# Patient Record
Sex: Female | Born: 1990
Health system: Southern US, Community
[De-identification: ages and names within clinical notes are randomized; demographics above are authoritative.]

## PROBLEM LIST (undated history)

## (undated) ENCOUNTER — Inpatient Hospital Stay (HOSPITAL_COMMUNITY): Payer: Self-pay

## (undated) DIAGNOSIS — R519 Headache, unspecified: Secondary | ICD-10-CM

## (undated) DIAGNOSIS — N289 Disorder of kidney and ureter, unspecified: Secondary | ICD-10-CM

## (undated) DIAGNOSIS — Z789 Other specified health status: Secondary | ICD-10-CM

## (undated) DIAGNOSIS — J45909 Unspecified asthma, uncomplicated: Secondary | ICD-10-CM

## (undated) DIAGNOSIS — K56609 Unspecified intestinal obstruction, unspecified as to partial versus complete obstruction: Secondary | ICD-10-CM

## (undated) HISTORY — PX: ANTERIOR CRUCIATE LIGAMENT REPAIR: SHX115

---
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
## 2010-07-06 ENCOUNTER — Emergency Department (HOSPITAL_COMMUNITY)
Admission: EM | Admit: 2010-07-06 | Discharge: 2010-07-06 | Disposition: A | Discharge status: Home / Self Care | Payer: Self-pay | Attending: Emergency Medicine | Admitting: Emergency Medicine

## 2010-07-06 DIAGNOSIS — R109 Unspecified abdominal pain: Secondary | ICD-10-CM | POA: Insufficient documentation

## 2010-07-06 DIAGNOSIS — N390 Urinary tract infection, site not specified: Secondary | ICD-10-CM | POA: Insufficient documentation

## 2010-07-06 DIAGNOSIS — R10819 Abdominal tenderness, unspecified site: Secondary | ICD-10-CM | POA: Insufficient documentation

## 2010-07-06 LAB — POCT I-STAT, CHEM 8
BUN: 9 mg/dL (ref 6–23)
Creatinine, Ser: 0.9 mg/dL (ref 0.4–1.2)
Potassium: 3.6 mEq/L (ref 3.5–5.1)
Sodium: 140 mEq/L (ref 135–145)
TCO2: 23 mmol/L (ref 0–100)

## 2010-07-06 LAB — DIFFERENTIAL
Basophils Absolute: 0 10*3/uL (ref 0.0–0.1)
Basophils Relative: 0 % (ref 0–1)
Eosinophils Absolute: 0.3 10*3/uL (ref 0.0–0.7)
Monocytes Relative: 7 % (ref 3–12)
Neutro Abs: 6.5 10*3/uL (ref 1.7–7.7)
Neutrophils Relative %: 70 % (ref 43–77)

## 2010-07-06 LAB — CBC
Hemoglobin: 12.3 g/dL (ref 12.0–15.0)
MCH: 29 pg (ref 26.0–34.0)
Platelets: 300 10*3/uL (ref 150–400)
RBC: 4.24 MIL/uL (ref 3.87–5.11)
WBC: 9.3 10*3/uL (ref 4.0–10.5)

## 2010-07-07 LAB — URINALYSIS, ROUTINE W REFLEX MICROSCOPIC
Protein, ur: NEGATIVE mg/dL
Specific Gravity, Urine: 1.02 (ref 1.005–1.030)
Urine Glucose, Fasting: NEGATIVE mg/dL
Urobilinogen, UA: 0.2 mg/dL (ref 0.0–1.0)

## 2010-07-07 LAB — URINE MICROSCOPIC-ADD ON
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
## 2010-10-25 ENCOUNTER — Emergency Department (HOSPITAL_COMMUNITY)
Admission: EM | Admit: 2010-10-25 | Discharge: 2010-10-25 | Disposition: A | Discharge status: Home / Self Care | Payer: Self-pay | Attending: Emergency Medicine | Admitting: Emergency Medicine

## 2010-10-25 ENCOUNTER — Emergency Department (HOSPITAL_COMMUNITY): Payer: Self-pay

## 2010-10-25 ENCOUNTER — Inpatient Hospital Stay (HOSPITAL_COMMUNITY)
Admission: EM | Admit: 2010-10-25 | Discharge: 2010-11-01 | DRG: 390 | Disposition: A | Discharge status: Home / Self Care | Payer: Medicaid Other | Attending: Internal Medicine | Admitting: Internal Medicine

## 2010-10-25 DIAGNOSIS — R630 Anorexia: Secondary | ICD-10-CM | POA: Insufficient documentation

## 2010-10-25 DIAGNOSIS — E876 Hypokalemia: Secondary | ICD-10-CM | POA: Diagnosis present

## 2010-10-25 DIAGNOSIS — K5900 Constipation, unspecified: Secondary | ICD-10-CM | POA: Insufficient documentation

## 2010-10-25 DIAGNOSIS — K5669 Other intestinal obstruction: Principal | ICD-10-CM | POA: Diagnosis present

## 2010-10-25 DIAGNOSIS — R109 Unspecified abdominal pain: Secondary | ICD-10-CM | POA: Insufficient documentation

## 2010-10-25 LAB — URINE MICROSCOPIC-ADD ON

## 2010-10-25 LAB — URINALYSIS, ROUTINE W REFLEX MICROSCOPIC
Bilirubin Urine: NEGATIVE
Glucose, UA: NEGATIVE mg/dL
Specific Gravity, Urine: 1.026 (ref 1.005–1.030)

## 2010-10-26 ENCOUNTER — Emergency Department (HOSPITAL_COMMUNITY): Payer: Medicaid Other

## 2010-10-26 ENCOUNTER — Inpatient Hospital Stay (HOSPITAL_COMMUNITY): Payer: Medicaid Other

## 2010-10-26 LAB — COMPREHENSIVE METABOLIC PANEL
ALT: 8 U/L (ref 0–35)
Albumin: 3.7 g/dL (ref 3.5–5.2)
Albumin: 4.1 g/dL (ref 3.5–5.2)
Alkaline Phosphatase: 44 U/L (ref 39–117)
BUN: 12 mg/dL (ref 6–23)
Chloride: 101 mEq/L (ref 96–112)
Creatinine, Ser: 0.62 mg/dL (ref 0.50–1.10)
Potassium: 4 mEq/L (ref 3.5–5.1)
Sodium: 139 mEq/L (ref 135–145)
Total Bilirubin: 0.6 mg/dL (ref 0.3–1.2)
Total Protein: 7.1 g/dL (ref 6.0–8.3)

## 2010-10-26 LAB — DIFFERENTIAL
Eosinophils Relative: 1 % (ref 0–5)
Lymphocytes Relative: 10 % — ABNORMAL LOW (ref 12–46)
Lymphocytes Relative: 12 % (ref 12–46)
Lymphs Abs: 1 10*3/uL (ref 0.7–4.0)
Lymphs Abs: 1 10*3/uL (ref 0.7–4.0)
Monocytes Absolute: 0.5 10*3/uL (ref 0.1–1.0)
Monocytes Relative: 5 % (ref 3–12)
Neutro Abs: 7.2 10*3/uL (ref 1.7–7.7)
Neutrophils Relative %: 83 % — ABNORMAL HIGH (ref 43–77)

## 2010-10-26 LAB — MAGNESIUM: Magnesium: 2.2 mg/dL (ref 1.5–2.5)

## 2010-10-26 LAB — CBC
HCT: 36.9 % (ref 36.0–46.0)
MCH: 28.3 pg (ref 26.0–34.0)
MCHC: 34.5 g/dL (ref 30.0–36.0)
MCHC: 34.7 g/dL (ref 30.0–36.0)
MCV: 81.6 fL (ref 78.0–100.0)
RDW: 13.6 % (ref 11.5–15.5)
RDW: 13.8 % (ref 11.5–15.5)
WBC: 9.7 10*3/uL (ref 4.0–10.5)

## 2010-10-26 LAB — TSH
TSH: 0.647 u[IU]/mL (ref 0.350–4.500)
TSH: 0.924 u[IU]/mL (ref 0.350–4.500)

## 2010-10-26 LAB — URINALYSIS, ROUTINE W REFLEX MICROSCOPIC
Bilirubin Urine: NEGATIVE
Ketones, ur: >80 mg/dL — AB
Nitrite: NEGATIVE
Specific Gravity, Urine: 1.029 (ref 1.005–1.030)
Urobilinogen, UA: 0.2 mg/dL (ref 0.0–1.0)

## 2010-10-26 LAB — PREGNANCY, URINE: Preg Test, Ur: NEGATIVE

## 2010-10-26 LAB — LIPASE, BLOOD: Lipase: 9 U/L — ABNORMAL LOW (ref 11–59)

## 2010-10-26 LAB — URINE MICROSCOPIC-ADD ON

## 2010-10-26 LAB — GLUCOSE, CAPILLARY
Glucose-Capillary: 92 mg/dL (ref 70–99)
Glucose-Capillary: 102 mg/dL — ABNORMAL HIGH (ref 70–99)

## 2010-10-26 MED ORDER — IOHEXOL 300 MG/ML  SOLN
100.0000 mL | Freq: Once | INTRAMUSCULAR | Status: AC | PRN
  Administered 2010-10-26: 100 mL via INTRAVENOUS

## 2010-10-27 ENCOUNTER — Other Ambulatory Visit: Payer: Self-pay | Admitting: Gastroenterology

## 2010-10-27 ENCOUNTER — Inpatient Hospital Stay (HOSPITAL_COMMUNITY): Payer: Medicaid Other

## 2010-10-27 LAB — CBC
MCH: 28.6 pg (ref 26.0–34.0)
MCHC: 34.7 g/dL (ref 30.0–36.0)
Platelets: 295 10*3/uL (ref 150–400)
RBC: 4.19 MIL/uL (ref 3.87–5.11)
RDW: 13.8 % (ref 11.5–15.5)

## 2010-10-27 LAB — GLUCOSE, CAPILLARY
Glucose-Capillary: 120 mg/dL — ABNORMAL HIGH (ref 70–99)
Glucose-Capillary: 128 mg/dL — ABNORMAL HIGH (ref 70–99)
Glucose-Capillary: 158 mg/dL — ABNORMAL HIGH (ref 70–99)

## 2010-10-27 LAB — BASIC METABOLIC PANEL
CO2: 26 mEq/L (ref 19–32)
Calcium: 8.7 mg/dL (ref 8.4–10.5)
GFR calc Af Amer: >60 mL/min (ref >60)
GFR calc non Af Amer: >60 mL/min (ref >60)
Sodium: 139 mEq/L (ref 135–145)

## 2010-10-28 ENCOUNTER — Inpatient Hospital Stay (HOSPITAL_COMMUNITY): Payer: Medicaid Other

## 2010-10-28 LAB — COMPREHENSIVE METABOLIC PANEL
Albumin: 3.1 g/dL — ABNORMAL LOW (ref 3.5–5.2)
Alkaline Phosphatase: 37 U/L — ABNORMAL LOW (ref 39–117)
BUN: 5 mg/dL — ABNORMAL LOW (ref 6–23)
CO2: 29 mEq/L (ref 19–32)
Chloride: 104 mEq/L (ref 96–112)
Creatinine, Ser: 0.55 mg/dL (ref 0.50–1.10)
GFR calc Af Amer: >60 mL/min (ref >60)
GFR calc non Af Amer: >60 mL/min (ref >60)
Glucose, Bld: 103 mg/dL — ABNORMAL HIGH (ref 70–99)
Potassium: 3.5 mEq/L (ref 3.5–5.1)
Total Bilirubin: 0.5 mg/dL (ref 0.3–1.2)

## 2010-10-28 LAB — GLUCOSE, CAPILLARY: Glucose-Capillary: 110 mg/dL — ABNORMAL HIGH (ref 70–99)

## 2010-10-28 LAB — CBC
HCT: 32.5 % — ABNORMAL LOW (ref 36.0–46.0)
Hemoglobin: 11.3 g/dL — ABNORMAL LOW (ref 12.0–15.0)
MCV: 83.5 fL (ref 78.0–100.0)
RBC: 3.89 MIL/uL (ref 3.87–5.11)
WBC: 6.7 10*3/uL (ref 4.0–10.5)

## 2010-10-29 ENCOUNTER — Inpatient Hospital Stay (HOSPITAL_COMMUNITY): Payer: Medicaid Other

## 2010-10-29 LAB — GLUCOSE, CAPILLARY
Glucose-Capillary: 111 mg/dL — ABNORMAL HIGH (ref 70–99)
Glucose-Capillary: 72 mg/dL (ref 70–99)
Glucose-Capillary: 90 mg/dL (ref 70–99)
Glucose-Capillary: 91 mg/dL (ref 70–99)

## 2010-10-29 LAB — CBC
HCT: 34.9 % — ABNORMAL LOW (ref 36.0–46.0)
MCH: 28.8 pg (ref 26.0–34.0)
MCV: 82.5 fL (ref 78.0–100.0)
RBC: 4.23 MIL/uL (ref 3.87–5.11)
RDW: 13.9 % (ref 11.5–15.5)
WBC: 6.7 10*3/uL (ref 4.0–10.5)

## 2010-10-30 LAB — GLUCOSE, CAPILLARY
Glucose-Capillary: 123 mg/dL — ABNORMAL HIGH (ref 70–99)
Glucose-Capillary: 96 mg/dL (ref 70–99)

## 2010-10-31 ENCOUNTER — Inpatient Hospital Stay (HOSPITAL_COMMUNITY): Payer: Medicaid Other

## 2010-11-01 ENCOUNTER — Inpatient Hospital Stay (HOSPITAL_COMMUNITY): Payer: Medicaid Other

## 2010-11-02 NOTE — H&P (Signed)
April Crane, April Crane NO.:  [id number]  MEDICAL RECORD NO.:  [id number]  LOCATION:  [facility]                         FACILITY:  [facility]  PHYSICIAN:  Eduard Clos, MDDATE OF BIRTH:  12-Feb-1991  DATE OF ADMISSION:  10/25/2010 DATE OF DISCHARGE:                             HISTORY & PHYSICAL   PRIMARY CARE PHYSICIAN:  Lorelle Formosa, MD  CHIEF COMPLAINT:  Abdominal pain.  HISTORY OF PRESENTING ILLNESS:  A 20 year old female with no significant past medical history, has been experiencing abdominal pain over the last 3-4 days.  The abdominal pain is usually in the lower part of the abdomen and both lower quadrants, has gotten gradually worse, and she had multiple episodes of nausea, vomiting.  She did come to ER initially and had x-rays done and was sent home back, but since the symptoms persisted, the patient came back.  At that time, had a CT of abdomen and pelvis with contrast which at this time shows colonic obstruction with focal wall thickening in the sigmoid colon.  At this time, the patient has been admitted for further workup.  Initially, Surgery was called and they have recommended Hospitalist admission.  The patient denies any chest pain, shortness of breath.  Denies any cough or phlegm, fever or chills.  Denies any dysuria, discharge, or diarrhea.  The last time she had moved bowels was more than 4 days ago. She also has not moved any flatus.  The pain is more of a constant, dull, aching pain, sometimes crampy.  The patient has denied using any recent antibiotics.  Denies any loss of consciousness or focal deficit.  PAST MEDICAL HISTORY:  Nothing significant.  PAST SURGICAL HISTORY:  Has had surgery for knee.  MEDICATIONS PRIOR TO ADMISSION:  None.  SOCIAL HISTORY:  The patient denies smoking cigarettes, drinking alcohol, or using illegal drugs.  FAMILY HISTORY:  Significant for breast cancer in a grandmother, also stroke in the family  in a grandfather.  REVIEW OF SYSTEMS:  As per the history of presenting illness, nothing else significant.  ALLERGIES:  No known drug allergies.  PHYSICAL EXAMINATION:  GENERAL:  The patient examined at bedside, not in any acute distress. VITAL SIGNS:  Blood pressure 120/80, pulse 96 per minute, temperature 99.3, respirations 18 per minute, O2 sat 100%. HEENT:  Anicteric.  No pallor.  No discharge from ears, eyes, nose, or mouth. CHEST:  Bilateral air entry present.  No rhonchi, no crepitation. HEART:  S1, S2 heard. ABDOMEN:  Soft.  There is tenderness.  At this time, tenderness is more diffuse, but more pronounced in the lower part of the abdomen. CNS:  The patient is alert, awake; oriented to time, place, and person. Moves upper and lower extremities 5/5. EXTREMITIES:  Peripheral pulses felt.  No edema.  LABORATORY DATA:  Acute abdomen series shows mild cardiomegaly, no evidence of obstruction or free air.  CT abdomen and pelvis with contrast shows distal colonic obstruction with focal wall thickening seen in the sigmoid colon, likely due to inflammatory or infectious process, although unlikely given the patient's age, neoplasm cannot be excluded.  Findings discussed with Dr. Achilles Dunk at the time of interpretation.  CBC; WBC  is 9.7, hemoglobin is 12.8, hematocrit is 36.9, platelets 302, neutrophils 85%.  Complete metabolic panel; sodium 138, potassium 3.8, chloride 101, carbon dioxide 26, glucose 107, BUN 12, creatinine 0.6.  Total bilirubin is 0.6, alkaline phos is 46, AST 13, ALT 9, total protein 8, albumin 4.1, calcium 9.5, lipase 9. Pregnancy screen is negative.  UA appears cloudy, more than 80 ketones, nitrites negative, leukocyte is trace, squamous cells many, wbc 3-6, bacteria many, mucus present.  ASSESSMENT:  Distal colonic obstruction with focal wall thickening seen in the sigmoid colon, likely due to inflammation or infectious process.  PLAN: 1. At this time, we will  admit the patient to medical floor. 2. For her chronic obstruction and focal wall thickening of the     sigmoid colon, at this time, we will keep the patient n.p.o., place     the patient on Cipro and Flagyl, IV fluids, and recheck a CMET,     CBC.  I have also consulted Dr. Charlesetta Shanks of Surgery for their     opinion and further recommendation as condition evolves and based     on the consult recommendations.     Eduard Clos, MD     ANK/MEDQ  D:  10/26/2010  T:  10/26/2010  Job:  [id number]  cc:   Lorelle Formosa, M.D.  Electronically Signed by Midge Minium MD on 11/02/2010 07:39:41 AM

## 2010-11-17 NOTE — Consult Note (Signed)
NAMEABIGAELLE, VERLEY NO.:  [id number]  MEDICAL RECORD NO.:  [id number]  LOCATION:  5127                         FACILITY:  [facility]  PHYSICIAN:  Velora Heckler, MD      DATE OF BIRTH:  05-31-1990  DATE OF CONSULTATION:  10/26/2010 DATE OF DISCHARGE:                                CONSULTATION   REQUESTING PHYSICIAN:  Eduard Clos, MD  PRIMARY CARE DOCTOR:  Lorelle Formosa, MD  REASON FOR CONSULTATION:  Abdominal pain and CT showing colon obstruction.  BRIEF HISTORY:  The patient is a 20 year old African American female who has been in good health.  She reports on Thursday she started having abdominal pain.  She has had no nausea or vomiting.  No diarrhea.  She initially told me that she had not had a bowel movement for 2 months. Her sister is with her she says it has been a month or more.  The patient can't  confirm how long, but she will agrees possibly a month.  She was seen in the ER yesterday and diagnosis of constipation was made. She was treated with MiraLax, Colace, and mag citrate.  She went home and apparently get to the point where she could not walk and her family brought her back to the ER.  Flat plate of the abdomen shows mild cardiomegaly, no evidence of obstruction or free air.  There was a nonobstructive pattern at that time.  On return, CT of the abdomen shows distal colonic obstruction with focal wall thickening seen in the sigmoid colon, likely due to inflammation or infectious process.  The last film done at 10:58 a.m. shows colon dilatation and obstruction, NG is in place to coil, but no changes from the prior studies.  We were asked to see in consultation.  PAST MEDICAL HISTORY:  None.  It is of note that she had trouble with nausea and vomiting in February for a month which coincided after a depo shot for birth control pills.  The patient's sister does not know which medicine was given.  Otherwise, no past medical  history.  PAST SURGICAL HISTORY:  She had ACL tear in the left knee.  FAMILY HISTORY:  Both parents are living and healthy.  Five brothers and two sisters, one brother died with some problems with hydrocephalus, one brother 35 has migraines, one sister has asthma, brothers are healthy.  SOCIAL HISTORY:  The patient denies drug use, tobacco use, alcohol use. She lives with her mom.  She is unemployed.  REVIEW OF SYSTEMS:  No fever.  She has had some chills since this morning.  CVS:  Negative for headache, dizziness, syncope, presyncope, stroke or seizures.  SKIN:  No changes.  PSYCH:  No changes.  HEART:  No chest pain or angina.  GI:  Negative for GERD.  No diarrhea.  Positive for constipation for least a month, perhaps longer.  BLOOD:  None.  GU: No trouble voiding.  LOWER EXTREMITIES:  No edema claudication.  GYN: She has had discharge and some slow bleeding sounds like implementation or implantation of an Implanon for birth control.  CURRENT MEDICATIONS:  Implanon, birth control, placed in March.  It  was done at [facility] in [place].  Yesterday she received Colace, milk of magnesia or mag citrate were uncertain which and Maalox and had nausea and vomiting with that.  ALLERGIES:  Possibly DEPO-PROVERA, she had nausea and vomiting for a month after the shot was given in February.  PHYSICAL EXAMINATION:  GENERAL:  This is a well-nourished [place] American female.  She is distended.  She is quite uncomfortable and very quiet.  Her sisters answers most of the questions.  VITAL SIGNS: Temperature is 98.6, heart rate is 93, blood pressure is 120/78, respiratory rate is 18, and sats 100% on room air. HEAD:  Eyes, ears, nose, and throat are grossly normal. NECK:  Trachea is in the midline.  Thyroid is nonpalpable. CHEST:  Clear to auscultation.  Her chest is nontender. CARDIAC:  Normal S1 and S2.  She is tachycardic.  Pulses are +2 and equal upper and lower  extremities. ABDOMEN:  Markedly distended.  Bowel sounds are absent.  She is slightly tender. GU/RECTAL:  Deferred. LYMPHADENOPATHY:  None palpated. MUSCULOSKELETAL:  No joint abnormalities. SKIN:  No changes. NEUROLOGIC:  Cranial nerves intact.  The patient is alert and cooperative. PSYCH:  Flat affect.  Sister says she has always very quiet.  LABORATORY DATA:  White count is 8.7, hemoglobin is 12, hematocrit is 35, and platelets are 389,000.  HCG was negative.  Lipase was 9.  UA showed 3-6 white cells and red cells per high-powered field with many bacteria.  She is positive for ketones.  Sodium is 139, potassium is 4, chloride 103, CO2 is 24, BUN is 10, creatinine is 0.49, glucose is 103, total bilirubin 0.6, alk phos 44, SGOT 12, and SGPT 18.  IMPRESSION: 1. Colon obstruction, questionable colitis. 2. Constipation a month, perhaps longer. 3. Abnormal menses since implantation of Implanon. 4. History of nausea and vomiting with Depo-Provera shot for birth     control.  PLAN:  I talked to the hospitalist and GI is being called.  We will continue follow with you, agree with NG, further workup and evaluation as needed.     Eber Hong, P.A.   ______________________________ Velora Heckler, MD    WDJ/MEDQ  D:  10/26/2010  T:  10/26/2010  Job:  [id number]  cc:   Lorelle Formosa, M.D.  Electronically Signed by Sherrie George P.A. on 11/02/2010 03:52:31 PM Electronically Signed by Darnell Level MD on 11/17/2010 10:58:04 AM

## 2010-11-24 NOTE — Discharge Summary (Signed)
NAMEKI, April NO.:  [id number]  MEDICAL RECORD NO.:  [id number]  LOCATION:                                 FACILITY:  PHYSICIAN:  April Cove, MD     DATE OF BIRTH:  1990/09/01  DATE OF ADMISSION: DATE OF DISCHARGE:                              DISCHARGE SUMMARY   PRIMARY CARE PHYSICIAN:  April Formosa, MD  DISCHARGE DIAGNOSES: 1. Colonic obstruction/sigmoid edema, improved. 2. Pathology suspicious for ischemia, possible small thrombotic event     due to birth control implant.  CONSULTANTS: 1. Velora Heckler, MD, [facility]. 2. John C. Madilyn Fireman, MD, [facility].  DIAGNOSTIC INVESTIGATIONS: 1. CT of abdomen and pelvis, November 03, 2010, distal colonic obstruction     focal wall thickening in the sigmoid due to inflammatory or     infectious process.  Repeat KUB showed marked dilatation of the     colon compatible with distal colonic obstruction.  Followup x-ray     last one June 01, 2010, shows decrease in colonic distention,     however, colon is mildly distended proximal to the mid descending     colon, beyond which there is a little gas, low-grade obstruction of     the mid descending colon is possible. 2. Colonoscopy by Dr. Dorena Cookey showed sigmoid thickening with     proximal dilatation, biopsies done. 3. Pathology:  Colonic mucosa was crypt distortion, crypt elongation,     and crypt atrophy, associated with lamina propria edema,     hyalinization, minimal neutrophilic inflammation, negative for     dysplasia.  Findings are not that of idiopathic inflammatory bowel     disease or microscopic colitis.  Morphological features are     consistent with ischemia type of injury.  HOSPITAL COURSE:  April Crane is a pleasant 20 year old African American female with no significant past medical history presented to the hospital with lower abdominal pain, distention, nausea, and vomiting. On evaluation in the ED, she was found to  have colonic obstruction and sigmoid edema, subsequently followed by [facility] as well as Dr. Madilyn Fireman with [facility], underwent a colonoscopy which showed sigmoid edema.  Pathology was really not very diagnostic but suspicious for ischemic injury.  This is a possibility given the fact that the patient has been on Implanon, has had an Implanon implant which is a progesterone based dermal implant for birth control.  She has been advised to get this removed since it could have been a potential small thrombotic event that caused this and she is also advised to use alternative birth control methods and avoid birth control pills. The patient's clinical course has improved through her hospital stay in the last week, especially in the last couple of days she has been originally started on clear liquids, advanced to full liquids, and then bland diet which she has been tolerating well.  She has had a couple of bowel movements yesterday as well as today and is being discharged home in a stable condition to follow up with Dr. Madilyn Fireman of [facility].  She was also being empirically treated with Cipro and Flagyl and will continue  this to complete a 10-day course of antibiotics.  DISCHARGE FOLLOWUP: 1. April Formosa, MD, in 1 week. 2. John C. Madilyn Fireman, MD, in 2-3 weeks.     April Cove, MD     PJ/MEDQ  D:  11/01/2010  T:  11/01/2010  Job:  [id number]  cc:   Everardo All. Madilyn Fireman, M.D. April Crane, M.D.  Electronically Signed by April Crane  on 11/24/2010 08:13:42 PM

## 2010-11-25 NOTE — Consult Note (Signed)
  April Crane, GOMES NO.:  [id number]  MEDICAL RECORD NO.:  [id number]  LOCATION:  5127                         FACILITY:  [facility]  PHYSICIAN:  Jaydence Arnesen C. Madilyn Fireman, M.D.    DATE OF BIRTH:  1990-08-18  DATE OF CONSULTATION:  10/26/2010 DATE OF DISCHARGE:                                CONSULTATION   REASON FOR CONSULTATION:  Colonic obstruction with marked sigmoid thickening on CT scan.  HISTORY OF PRESENT ILLNESS:  The patient is a 20 year old white female who was in her usual state of good health when she began experiencing lower abdominal pain progressive over 2 to 3 days, culminating in an abdominal distention, multiple episodes of nausea and vomiting.  She was seen in the emergency room, once treated conservatively, and sent back, but presented again and on the second time CT scan of the abdomen showed significant colonic distention down to a marked circumferential thickening of the sigmoid colon with decompression of the colon distally.  The patient states she has not had any bowel movements or passed any gas today.  She has no family history of inflammatory bowel disease.  She denies any recent antibiotics or significant travel.  She has not had any trouble with constipation or impactions in the past.  PAST MEDICAL HISTORY:  Essentially unremarkable.  PAST SURGICAL HISTORY:  ACL and knee repair.  FAMILY HISTORY:  Both parents healthy.  No family history of GI malignancy.  MEDICATIONS:  None.  ALLERGIES:  None known.  PHYSICAL EXAMINATION:  GENERAL:  The patient currently with an NG tube in place, sedated with pain medicines, minimally verbal, but arousable and nods appropriately to questions with brief verbal answers. HEENT:  Unremarkable. HEART:  Regular rate and rhythm without murmur. ABDOMEN:  Moderately firm, distended, symmetrically with hypoactive bowel sounds.  There is diffuse mild tenderness with mild rebound.  LABORATORY DATA:  Hemoglobin  12.2, hematocrit 35.3, WBC 8700.  Pregnancy test negative.  Urinalysis shows large blood, but only 3-6 wbc's per high-powered field.  Lipase normal.  IMPRESSION:  Acute colonic obstruction with circumferential sigmoid thickening, felt to be inflammatory or infectious greater than neoplastic, somewhat unusual presentation in this age for inflammatory bowel disease or either an acute infectious and colitis.  PLAN: 1. We will obtain stool studies if available. 2. Agree with Cipro and Flagyl. 3. Agree with surgery consult. 4. NG suction for now. 5. Reevaluate in morning.  We will discuss with Surgery, threshold for     possible sigmoidoscopy versus Gastrografin enema versus surgical     intervention.          ______________________________ Everardo All Madilyn Fireman, M.D.     JCH/MEDQ  D:  10/26/2010  T:  10/26/2010  Job:  [id number]  cc:   Velora Heckler, MD  Electronically Signed by Dorena Cookey M.D. on 11/25/2010 07:09:39 PM

## 2011-02-02 LAB — POCT HEMOGLOBIN-HEMACUE: Hemoglobin: 12

## 2011-05-22 ENCOUNTER — Emergency Department (HOSPITAL_COMMUNITY)
Admission: EM | Admit: 2011-05-22 | Discharge: 2011-05-22 | Disposition: A | Discharge status: Home / Self Care | Payer: Medicaid Other | Attending: Emergency Medicine | Admitting: Emergency Medicine

## 2011-05-22 ENCOUNTER — Encounter (HOSPITAL_COMMUNITY): Payer: Self-pay | Admitting: Emergency Medicine

## 2011-05-22 DIAGNOSIS — L02416 Cutaneous abscess of left lower limb: Secondary | ICD-10-CM

## 2011-05-22 DIAGNOSIS — L03119 Cellulitis of unspecified part of limb: Secondary | ICD-10-CM | POA: Insufficient documentation

## 2011-05-22 DIAGNOSIS — L02419 Cutaneous abscess of limb, unspecified: Secondary | ICD-10-CM | POA: Insufficient documentation

## 2011-05-22 MED ORDER — LIDOCAINE-EPINEPHRINE 2 %-1:100000 IJ SOLN
20.0000 mL | Freq: Once | INTRAMUSCULAR | Status: AC
  Administered 2011-05-22: 20 mL via INTRADERMAL

## 2011-05-22 MED ORDER — HYDROCODONE-ACETAMINOPHEN 5-325 MG PO TABS: 2.0000 | ORAL_TABLET | ORAL | Status: DC | PRN

## 2011-05-22 NOTE — ED Provider Notes (Signed)
History     CSN: [id number]  Arrival date & time 05/22/11  1023   First MD Initiated Contact with Patient 05/22/11 1038      Chief Complaint  Patient presents with  . Abscess    (Consider location/radiation/quality/duration/timing/severity/associated sxs/prior treatment) HPI  21 year old female presenting to the ED with chief complaints of abscess. Patient noticed a area of redness and drainage to the lateral side of the left leg. Area is tender to the touch, with increased swelling for the past week. Patient states it feels similar to the prior abscess. She denies fever, nausea, vomiting, diarrhea, joint pain. She denies recent trauma, or insect bite. She has tried using warm compress over the wound without moderate relief.  History reviewed. No pertinent past medical history.  History reviewed. No pertinent past surgical history.  History reviewed. No pertinent family history.  History  Substance Use Topics  . Smoking status: Never Smoker   . Smokeless tobacco: Not on file  . Alcohol Use: No    OB History    Grav Para Term Preterm Abortions TAB SAB Ect Mult Living                  Review of Systems  All other systems reviewed and are negative.    Allergies  Other  Home Medications   Current Outpatient Rx  Name Route Sig Dispense Refill  . IBUPROFEN 800 MG PO TABS Oral Take 800 mg by mouth 2 (two) times daily as needed. For pain.      BP 115/72  Pulse 98  Temp(Src) 98.4 F (36.9 C) (Oral)  Resp 18  SpO2 100%  Physical Exam  Nursing note and vitals reviewed. Constitutional:       Awake, alert, nontoxic appearance  HENT:  Head: Atraumatic.  Eyes: Right eye exhibits no discharge. Left eye exhibits no discharge.  Neck: Neck supple.  Pulmonary/Chest: Effort normal. She exhibits no tenderness.  Abdominal: There is no tenderness. There is no rebound.  Musculoskeletal: She exhibits no tenderness.       Baseline ROM, no obvious new focal weakness    Neurological:       Mental status and motor strength appears baseline for patient and situation  Skin: No rash noted.     Psychiatric: She has a normal mood and affect.    ED Course  Procedures (including critical care time)  Labs Reviewed - No data to display No results found.   No diagnosis found.  INCISION AND DRAINAGE Performed by: Fayrene Helper Consent: Verbal consent obtained. Risks and benefits: risks, benefits and alternatives were discussed Type: abscess  Body area: L lateral knee  Anesthesia: local infiltration  Local anesthetic: lidocaine 2% w epinephrine  Anesthetic total: 3 ml  Complexity: complex Blunt dissection to break up loculations  Drainage: purulent  Drainage amount: minimal  Packing material: 1/4 in iodoform gauze  Patient tolerance: Patient tolerated the procedure well with no immediate complications.    MDM  Abscess with successful I&D.  Packing placed.  Discharge instruction given.  Pt tolerates procedure well.          Fayrene Helper, PA-C 05/22/11 1126

## 2011-05-22 NOTE — ED Notes (Signed)
Pt c/o red abscess area to side of left leg that is painful and draining purulent discharge x 1 week

## 2011-05-22 NOTE — ED Provider Notes (Signed)
Medical screening examination/treatment/procedure(s) were performed by non-physician practitioner and as supervising physician I was immediately available for consultation/collaboration.   Forbes Cellar, MD 05/22/11 1317

## 2011-05-26 ENCOUNTER — Inpatient Hospital Stay (HOSPITAL_COMMUNITY)
Admission: AD | Admit: 2011-05-26 | Discharge: 2011-05-26 | Disposition: A | Discharge status: Home / Self Care | Payer: Medicaid Other | Source: Ambulatory Visit | Attending: Obstetrics & Gynecology | Admitting: Obstetrics & Gynecology

## 2011-05-26 ENCOUNTER — Encounter (HOSPITAL_COMMUNITY): Payer: Self-pay | Admitting: *Deleted

## 2011-05-26 DIAGNOSIS — K6289 Other specified diseases of anus and rectum: Secondary | ICD-10-CM | POA: Insufficient documentation

## 2011-05-26 DIAGNOSIS — K5900 Constipation, unspecified: Secondary | ICD-10-CM | POA: Insufficient documentation

## 2011-05-26 HISTORY — DX: Other specified health status: Z78.9

## 2011-05-26 NOTE — ED Provider Notes (Signed)
History     Chief Complaint  Patient presents with  . Constipation   HPI April Crane 20 y.o. Comes to [facility]U with rectal pain.  Started Miralax today.  Had a BM yesterday and today but continues to have rectal pain after the BM.  Client seems intellectually slow or extremely timid and shy.  Did not give info related to past medical care without reading past charts and asking her specifically about the episodes.  Was seen at ER on 05-22-11 and had an abcess drained and packed.  Was given narcotic for pain management.  Also was hospitalized in June 2012 and diagnosed with edema of sigmoid colon.  Was to have seen a GI specialist but did not have money for the copay and was not seen.  Currently has Implanon rod in since March 2012.  OB History    Grav Para Term Preterm Abortions TAB SAB Ect Mult Living   0 0              Past Medical History  Diagnosis Date  . No pertinent past medical history     Past Surgical History  Procedure Date  . Anterior cruciate ligament repair     Family History  Problem Relation Age of Onset  . Asthma Sister   . Cancer Maternal Grandmother     History  Substance Use Topics  . Smoking status: Never Smoker   . Smokeless tobacco: Not on file  . Alcohol Use: No    Allergies:  Allergies  Allergen Reactions  . Other Nausea And Vomiting    Antibiotic    Prescriptions prior to admission  Medication Sig Dispense Refill  . docusate sodium (COLACE) 100 MG capsule Take 100 mg by mouth 3 (three) times daily as needed. For constipation      . HYDROcodone-acetaminophen (NORCO) 5-325 MG per tablet Take 2 tablets by mouth every 4 (four) hours as needed. For pain      . ibuprofen (ADVIL,MOTRIN) 800 MG tablet Take 800 mg by mouth 2 (two) times daily as needed. For headache or pain.      . polyethylene glycol (MIRALAX / GLYCOLAX) packet Take 17 g by mouth daily as needed. For constipation      . DISCONTD: HYDROcodone-acetaminophen (NORCO) 5-325 MG per tablet  Take 2 tablets by mouth every 4 (four) hours as needed for pain.  10 tablet  0    Review of Systems  Gastrointestinal: Positive for constipation. Negative for nausea, vomiting and abdominal pain.       Rectal pain   Physical Exam   Blood pressure 104/68, pulse 88, temperature 98.9 F (37.2 C), temperature source Oral, resp. rate 16, height 5' (1.524 m), weight 161 lb 3.2 oz (73.12 kg), SpO2 99.00%.  Physical Exam  Nursing note and vitals reviewed. Constitutional: She is oriented to person, place, and time. She appears well-developed and well-nourished.  HENT:  Head: Normocephalic.  Eyes: EOM are normal.  Neck: Neck supple.  GI: Soft. There is no tenderness.  Genitourinary:       Rectum - no hemorrhoids, no lesions Digital rectal exam with KY jelly - good muscle tone, no masses, no stool palpated.   Musculoskeletal: Normal range of motion.  Neurological: She is alert and oriented to person, place, and time.  Skin: Skin is warm and dry.  Psychiatric: She has a normal mood and affect.    MAU Course  Procedures     Assessment and Plan  Rectal pain Constipation  Plan Continue Miralax Can use Preparation H if having rectal pain Stop Narco and use Ibuprofen for pain See your primary care doctor - Dr. Ronne Binning - make an appointment Be sure to tell Dr. Ronne Binning about your hospital stay Drink at least 8 8-oz glasses of water every day. Eat a high fiber diet  BURLESON,TERRI 05/26/2011, 7:16 PM   Nolene Bernheim, NP 05/26/11 1930

## 2011-05-26 NOTE — Progress Notes (Addendum)
Pt in c/o pains when having bowel movements, and occasional bleeding.  Reports bowel movement today, states it was normal, just painful.  Denies any other pains.  States stool softeners do help.

## 2011-05-26 NOTE — Progress Notes (Signed)
Patient states she has been having problems with bowel movements since 1-14. Has been having pain and some bleeding with BM's. States she has been taking Miralax and stool softners and had a bowel movement this morning but does not feel it was normal. States she continues to have sharp rectal pain not only with BM's.

## 2011-11-10 ENCOUNTER — Encounter (HOSPITAL_COMMUNITY): Payer: Self-pay

## 2011-11-10 ENCOUNTER — Emergency Department (HOSPITAL_COMMUNITY)
Admission: EM | Admit: 2011-11-10 | Discharge: 2011-11-10 | Disposition: A | Discharge status: Home / Self Care | Payer: Medicaid Other | Attending: Emergency Medicine | Admitting: Emergency Medicine

## 2011-11-10 ENCOUNTER — Emergency Department (HOSPITAL_COMMUNITY): Payer: Medicaid Other

## 2011-11-10 DIAGNOSIS — R109 Unspecified abdominal pain: Secondary | ICD-10-CM

## 2011-11-10 LAB — POCT I-STAT, CHEM 8
BUN: 8 mg/dL (ref 6–23)
Calcium, Ion: 1.3 mmol/L (ref 1.12–1.32)
Chloride: 106 mEq/L (ref 96–112)
HCT: 40 % (ref 36.0–46.0)
Sodium: 144 mEq/L (ref 135–145)

## 2011-11-10 LAB — URINALYSIS, ROUTINE W REFLEX MICROSCOPIC
Hgb urine dipstick: NEGATIVE
Leukocytes, UA: NEGATIVE
Nitrite: NEGATIVE
Protein, ur: NEGATIVE mg/dL
Specific Gravity, Urine: 1.019 (ref 1.005–1.030)
Urobilinogen, UA: 1 mg/dL (ref 0.0–1.0)

## 2011-11-10 LAB — LIPASE, BLOOD: Lipase: 14 U/L (ref 11–59)

## 2011-11-10 LAB — CBC WITH DIFFERENTIAL/PLATELET
Basophils Absolute: 0 10*3/uL (ref 0.0–0.1)
Basophils Relative: 0 % (ref 0–1)
HCT: 38.5 % (ref 36.0–46.0)
Lymphocytes Relative: 38 % (ref 12–46)
MCHC: 33.5 g/dL (ref 30.0–36.0)
Monocytes Absolute: 0.3 10*3/uL (ref 0.1–1.0)
Neutro Abs: 2.8 10*3/uL (ref 1.7–7.7)
Neutrophils Relative %: 54 % (ref 43–77)
Platelets: 298 10*3/uL (ref 150–400)
RDW: 13.2 % (ref 11.5–15.5)
WBC: 5.2 10*3/uL (ref 4.0–10.5)

## 2011-11-10 LAB — POCT PREGNANCY, URINE: Preg Test, Ur: NEGATIVE

## 2011-11-10 LAB — HEPATIC FUNCTION PANEL
ALT: 9 U/L (ref 0–35)
Albumin: 4.1 g/dL (ref 3.5–5.2)
Indirect Bilirubin: 0.4 mg/dL (ref 0.3–0.9)
Total Protein: 7.9 g/dL (ref 6.0–8.3)

## 2011-11-10 MED ORDER — IOHEXOL 300 MG/ML  SOLN
20.0000 mL | INTRAMUSCULAR | Status: AC
  Administered 2011-11-10: 20 mL via ORAL

## 2011-11-10 MED ORDER — MORPHINE SULFATE 4 MG/ML IJ SOLN
4.0000 mg | Freq: Once | INTRAMUSCULAR | Status: AC
  Administered 2011-11-10: 4 mg via INTRAVENOUS
  Filled 2011-11-10: qty 1

## 2011-11-10 MED ORDER — IOHEXOL 300 MG/ML  SOLN
100.0000 mL | Freq: Once | INTRAMUSCULAR | Status: AC | PRN
  Administered 2011-11-10: 100 mL via INTRAVENOUS

## 2011-11-10 MED ORDER — ONDANSETRON HCL 4 MG PO TABS: 4.0000 mg | ORAL_TABLET | Freq: Four times a day (QID) | ORAL | Status: AC

## 2011-11-10 MED ORDER — SODIUM CHLORIDE 0.9 % IV BOLUS (SEPSIS)
1000.0000 mL | Freq: Once | INTRAVENOUS | Status: AC
  Administered 2011-11-10: 1000 mL via INTRAVENOUS

## 2011-11-10 NOTE — ED Provider Notes (Signed)
Care assumed of pt in CDU from Dr. Hyman Hopes. Pt was moved to CDU for holding awaiting CT abd/pelvis. She has a hx of SBO last year and states "my pain feels the same as it did then." Labwork unremarkable. CT without evidence of SBO but does show poss mild ileus. Findings d/w Dr. Hyman Hopes. Do not feel that pt requires admission at this time. Pt advised to stick to clear liquid diet then slowly advance. She was able to tolerate clears while in the dept without difficulty prior to dc. Reasons to return to ED discussed. Pt and family verbalized understanding, agreed to plan.  Grant Fontana, PA-C 11/10/11 2313

## 2011-11-10 NOTE — ED Notes (Signed)
Patient complaining of left side abdominal pain. Rates the pain as 7/10 and states similar to pain that brought her here.

## 2011-11-10 NOTE — ED Notes (Signed)
Pt presents with 1 week h/o generalized abdominal pain.  -nausea, pt denies any dysuria or vaginal discharge.

## 2011-11-10 NOTE — ED Provider Notes (Signed)
History   This chart was scribed for Forbes Cellar, MD by Charolett Bumpers . The patient was seen in room TR09C/TR09C.    CSN: [id number]  Arrival date & time 11/10/11  1241   First MD Initiated Contact with Patient 11/10/11 1555      Chief Complaint  Patient presents with  . Abdominal Pain    (Consider location/radiation/quality/duration/timing/severity/associated sxs/prior treatment) HPI April Crane is a 21 y.o. female who presents to the Emergency Department complaining of constant, moderate lower abdominal pain that started 1 week ago. Patient rates her abdominal pain a 6/10. Patient denies taking any medications for pain today. Patient denies any n/v/d or constipation. Patient reports a h/o bowel obstruction last year ?ischemic related to implanon?. See EPIC for full details. Advised to have it removed but she did not.  Patient states that her abdominal pain today feels similar to when she had a bowel obstruction. Patient denies any urinary urgency, frequency, hematuria or dysuria. Patient denies any vaginal discharge. Patient denies any h/o medical problems. Patient states that she is sexually active and uses protection. No h/o sexually transmitted infections. Patient states that her LNMP was before the birth control was placed in her arm.    Past Medical History  Diagnosis Date  . No pertinent past medical history     Past Surgical History  Procedure Date  . Anterior cruciate ligament repair     Family History  Problem Relation Age of Onset  . Asthma Sister   . Cancer Maternal Grandmother     History  Substance Use Topics  . Smoking status: Never Smoker   . Smokeless tobacco: Not on file  . Alcohol Use: No    OB History    Grav Para Term Preterm Abortions TAB SAB Ect Mult Living   0 0             Review of Systems A complete 10 system review of systems was obtained and all systems are negative except as noted in the HPI and PMH.   Allergies    Other  Home Medications  No current outpatient prescriptions on file.  BP 103/68  Pulse 78  Temp 97.9 F (36.6 C) (Oral)  Resp 18  Ht 5\' 1"  (1.549 m)  Wt 159 lb (72.122 kg)  BMI 30.04 kg/m2  SpO2 100%  Physical Exam  Nursing note and vitals reviewed. Constitutional: She is oriented to person, place, and time. She appears well-developed and well-nourished. No distress.  HENT:  Head: Normocephalic and atraumatic.  Eyes: EOM are normal. Pupils are equal, round, and reactive to light.  Neck: Neck supple. No tracheal deviation present.  Cardiovascular: Normal rate, regular rhythm and normal heart sounds.   Pulmonary/Chest: Effort normal and breath sounds normal. No respiratory distress. She has no wheezes.  Abdominal: Soft. Bowel sounds are normal. She exhibits no distension. There is tenderness. There is no rebound and no guarding.       Mild diffuse ttp > lower abd  Genitourinary: Cervix exhibits no motion tenderness. Right adnexum displays no tenderness. Left adnexum displays no tenderness. No vaginal discharge found.       Bimanual exam, no vaginal discharge, no cervical motion tenderness, no adnexal tenderness bilaterally.   Musculoskeletal: Normal range of motion. She exhibits no edema.  Neurological: She is alert and oriented to person, place, and time. No sensory deficit.  Skin: Skin is warm and dry.  Psychiatric: She has a normal mood and affect. Her behavior is normal.  ED Course  Procedures (including critical care time)  DIAGNOSTIC STUDIES: Oxygen Saturation is 100% on room air, normal by my interpretation.    COORDINATION OF CARE:  4:40pm-Preformed pelvic exam.  4:42pm-Discussed planned course of treatment with the patient, who is agreeable at this time.  4:45pm-Medication Orders: Morphine 4 mg/mL injection 4 mg-once; Sodium chloride 0.9% bolus 1,000 mL-once  Results for orders placed during the hospital encounter of 11/10/11  URINALYSIS, ROUTINE W REFLEX  MICROSCOPIC      Component Value Range   Color, Urine YELLOW  YELLOW   APPearance CLOUDY (*) CLEAR   Specific Gravity, Urine 1.019  1.005 - 1.030   pH 8.0  5.0 - 8.0   Glucose, UA NEGATIVE  NEGATIVE mg/dL   Hgb urine dipstick NEGATIVE  NEGATIVE   Bilirubin Urine NEGATIVE  NEGATIVE   Ketones, ur NEGATIVE  NEGATIVE mg/dL   Protein, ur NEGATIVE  NEGATIVE mg/dL   Urobilinogen, UA 1.0  0.0 - 1.0 mg/dL   Nitrite NEGATIVE  NEGATIVE   Leukocytes, UA NEGATIVE  NEGATIVE  POCT PREGNANCY, URINE      Component Value Range   Preg Test, Ur NEGATIVE  NEGATIVE  CBC WITH DIFFERENTIAL      Component Value Range   WBC 5.2  4.0 - 10.5 K/uL   RBC 4.54  3.87 - 5.11 MIL/uL   Hemoglobin 12.9  12.0 - 15.0 g/dL   HCT 82.9  56.2 - 13.0 %   MCV 84.8  78.0 - 100.0 fL   MCH 28.4  26.0 - 34.0 pg   MCHC 33.5  30.0 - 36.0 g/dL   RDW 86.5  78.4 - 69.6 %   Platelets 298  150 - 400 K/uL   Neutrophils Relative 54  43 - 77 %   Neutro Abs 2.8  1.7 - 7.7 K/uL   Lymphocytes Relative 38  12 - 46 %   Lymphs Abs 2.0  0.7 - 4.0 K/uL   Monocytes Relative 5  3 - 12 %   Monocytes Absolute 0.3  0.1 - 1.0 K/uL   Eosinophils Relative 3  0 - 5 %   Eosinophils Absolute 0.2  0.0 - 0.7 K/uL   Basophils Relative 0  0 - 1 %   Basophils Absolute 0.0  0.0 - 0.1 K/uL  POCT I-STAT, CHEM 8      Component Value Range   Sodium 144  135 - 145 mEq/L   Potassium 4.3  3.5 - 5.1 mEq/L   Chloride 106  96 - 112 mEq/L   BUN 8  6 - 23 mg/dL   Creatinine, Ser 2.95  0.50 - 1.10 mg/dL   Glucose, Bld 90  70 - 99 mg/dL   Calcium, Ion 2.84  1.32 - 1.32 mmol/L   TCO2 26  0 - 100 mmol/L   Hemoglobin 13.6  12.0 - 15.0 g/dL   HCT 44.0  10.2 - 72.5 %  HEPATIC FUNCTION PANEL      Component Value Range   Total Protein 7.9  6.0 - 8.3 g/dL   Albumin 4.1  3.5 - 5.2 g/dL   AST 14  0 - 37 U/L   ALT 9  0 - 35 U/L   Alkaline Phosphatase 55  39 - 117 U/L   Total Bilirubin 0.5  0.3 - 1.2 mg/dL   Bilirubin, Direct 0.1  0.0 - 0.3 mg/dL   Indirect  Bilirubin 0.4  0.3 - 0.9 mg/dL  LIPASE, BLOOD      Component Value  Range   Lipase 14  11 - 59 U/L  LACTIC ACID, PLASMA      Component Value Range   Lactic Acid, Venous 0.9  0.5 - 2.2 mmol/L    No results found.  No diagnosis found.   MDM  Nonspecific Abdominal pain. H/o bowel obstruction and states pain feels identical. Labs unremarkable. Pain controlled with morphine. Doubt appy, SBO but will order CT A/P given complicated history. Doubt pelvic etiology. Pt moved to CDU. D/W PA Grant Fontana- F/U CT A/P and dispo accordingly.  I personally performed the services described in this documentation, which was scribed in my presence. The recorded information has been reviewed and considered.         Forbes Cellar, MD 11/10/11 [phone]

## 2011-11-10 NOTE — ED Notes (Signed)
Tolerating po's. No complaints at this time. Will continue to monitor patient

## 2011-11-10 NOTE — ED Notes (Signed)
Patient given clear liquids of chicken bouillon, soft drinks, and juice. Will continue to monitor patient.

## 2011-11-10 NOTE — ED Notes (Signed)
Pt. Reports lower abdominal pain X1 week. States "it feels the same as the last time I was admitted when I had a small bowel obstruction". Rates pain 6/10. Nontender to touch, bowel sounds hypoactive. LBM this AM.

## 2012-01-24 ENCOUNTER — Encounter (HOSPITAL_COMMUNITY): Payer: Self-pay | Admitting: Cardiology

## 2012-01-24 ENCOUNTER — Emergency Department (HOSPITAL_COMMUNITY)
Admission: EM | Admit: 2012-01-24 | Discharge: 2012-01-24 | Disposition: A | Discharge status: Home / Self Care | Payer: Medicaid Other | Attending: Emergency Medicine | Admitting: Emergency Medicine

## 2012-01-24 DIAGNOSIS — Z2089 Contact with and (suspected) exposure to other communicable diseases: Secondary | ICD-10-CM

## 2012-01-24 DIAGNOSIS — R21 Rash and other nonspecific skin eruption: Secondary | ICD-10-CM | POA: Insufficient documentation

## 2012-01-24 MED ORDER — PERMETHRIN 5 % EX CREA: TOPICAL_CREAM | CUTANEOUS | Status: DC

## 2012-01-24 MED ORDER — HYDROXYZINE HCL 25 MG PO TABS
25.0000 mg | ORAL_TABLET | Freq: Once | ORAL | Status: AC
  Administered 2012-01-24: 25 mg via ORAL
  Filled 2012-01-24: qty 1

## 2012-01-24 NOTE — ED Notes (Signed)
Pt reports an itchy rash on her feet for the past couple of days.

## 2012-01-24 NOTE — ED Provider Notes (Signed)
History     CSN: [id number]  Arrival date & time 01/24/12  0736   First MD Initiated Contact with Patient 01/24/12 0747      Chief Complaint  Patient presents with  . Rash    (Consider location/radiation/quality/duration/timing/severity/associated sxs/prior treatment) Patient is a 21 y.o. female presenting with rash. The history is provided by the patient.  Rash  This is a new problem. Episode onset: 1 week. The problem has been gradually worsening. The problem is associated with an unknown factor. There has been no fever. The rash is present on the right foot and left foot. The patient is experiencing no pain. Associated symptoms include itching. Pertinent negatives include no blisters, no pain and no weeping. She has tried nothing for the symptoms. Risk factors: other house hold members with same.    Past Medical History  Diagnosis Date  . No pertinent past medical history     Past Surgical History  Procedure Date  . Anterior cruciate ligament repair     Family History  Problem Relation Age of Onset  . Asthma Sister   . Cancer Maternal Grandmother     History  Substance Use Topics  . Smoking status: Never Smoker   . Smokeless tobacco: Not on file  . Alcohol Use: No    OB History    Grav Para Term Preterm Abortions TAB SAB Ect Mult Living   0 0              Review of Systems  Constitutional: Negative for fever, diaphoresis and activity change.  HENT: Negative for congestion and neck pain.   Respiratory: Negative for cough.   Genitourinary: Negative for dysuria.  Musculoskeletal: Negative for myalgias.  Skin: Positive for itching and rash. Negative for color change and wound.  Neurological: Negative for headaches.  All other systems reviewed and are negative.    Allergies  Other  Home Medications  No current outpatient prescriptions on file.  BP 113/78  Pulse 96  Temp 98.2 F (36.8 C) (Oral)  Resp 16  SpO2 100%  Physical Exam  Nursing note  and vitals reviewed. Constitutional: She is oriented to person, place, and time. She appears well-developed and well-nourished. No distress.  HENT:  Head: Normocephalic and atraumatic.  Eyes: Conjunctivae normal and EOM are normal.  Neck: Normal range of motion.  Pulmonary/Chest: Effort normal.  Musculoskeletal: Normal range of motion.  Neurological: She is alert and oriented to person, place, and time.  Skin: Skin is warm and dry. Rash noted. She is not diaphoretic.       5 small papule/pustules on left anterior foot surface, no weeping surrounding erythema or drainage. Similar on left ankle.   Psychiatric: She has a normal mood and affect. Her behavior is normal.    ED Course  Procedures (including critical care time)  Labs Reviewed - No data to display No results found.   No diagnosis found.    MDM  Rash  Pt to er w rash x 1 week. Children were also evaluated by me and have clear diagnosis of scabies. Mother will be treated as well for this with PCP follow up if treatment does not work. Discussed diagnosis & treatment of scabies with parent.  They have been advised to followup with her primary care doctor 2 weeks after treatment.  They have also been advised to clean entire household including washing sheets and using R.I.D. spray in the car and on sofa.   The use of permethrin cream was  discussed as well, they were told to use cream from head to toe & leave on child for 8-12 hours.  They've been advised to repeat treatment if new eruptions occur. Patient's parents verbalized understanding.          Jaci Carrel, [place] 01/24/12 [phone]

## 2012-01-25 NOTE — ED Provider Notes (Signed)
Medical screening examination/treatment/procedure(s) were performed by non-physician practitioner and as supervising physician I was immediately available for consultation/collaboration.   Lennyn Bellanca B. Wendie Diskin, MD 01/25/12 0832 

## 2012-02-26 ENCOUNTER — Emergency Department (HOSPITAL_COMMUNITY): Payer: Medicaid Other

## 2012-02-26 ENCOUNTER — Encounter (HOSPITAL_COMMUNITY): Payer: Self-pay

## 2012-02-26 DIAGNOSIS — R079 Chest pain, unspecified: Secondary | ICD-10-CM | POA: Insufficient documentation

## 2012-02-26 DIAGNOSIS — L02419 Cutaneous abscess of limb, unspecified: Secondary | ICD-10-CM | POA: Insufficient documentation

## 2012-02-26 LAB — BASIC METABOLIC PANEL
BUN: 12 mg/dL (ref 6–23)
Calcium: 9.3 mg/dL (ref 8.4–10.5)
Creatinine, Ser: 0.65 mg/dL (ref 0.50–1.10)
GFR calc Af Amer: >90 mL/min (ref >90)
GFR calc non Af Amer: >90 mL/min (ref >90)
Potassium: 3.5 mEq/L (ref 3.5–5.1)

## 2012-02-26 LAB — URINALYSIS, ROUTINE W REFLEX MICROSCOPIC
Bilirubin Urine: NEGATIVE
Ketones, ur: NEGATIVE mg/dL
Protein, ur: NEGATIVE mg/dL
Urobilinogen, UA: 1 mg/dL (ref 0.0–1.0)

## 2012-02-26 LAB — CBC
HCT: 36.8 % (ref 36.0–46.0)
MCHC: 34.8 g/dL (ref 30.0–36.0)
MCV: 84.2 fL (ref 78.0–100.0)
RDW: 13 % (ref 11.5–15.5)

## 2012-02-26 LAB — URINE MICROSCOPIC-ADD ON

## 2012-02-26 NOTE — ED Notes (Signed)
Pt reports mid-sternum chest pain, SOB, and dizziness x2 days, pt also reports and "area" to her (R) leg, pt sts "I think I may have been bitten by a spider," x1 week

## 2012-02-27 ENCOUNTER — Encounter (HOSPITAL_COMMUNITY): Payer: Self-pay | Admitting: Emergency Medicine

## 2012-02-27 ENCOUNTER — Emergency Department (HOSPITAL_COMMUNITY)
Admission: EM | Admit: 2012-02-27 | Discharge: 2012-02-27 | Disposition: A | Discharge status: Home / Self Care | Payer: Medicaid Other | Attending: Emergency Medicine | Admitting: Emergency Medicine

## 2012-02-27 DIAGNOSIS — L02415 Cutaneous abscess of right lower limb: Secondary | ICD-10-CM

## 2012-02-27 DIAGNOSIS — R079 Chest pain, unspecified: Secondary | ICD-10-CM

## 2012-02-27 HISTORY — DX: Unspecified intestinal obstruction, unspecified as to partial versus complete obstruction: K56.609

## 2012-02-27 MED ORDER — KETOROLAC TROMETHAMINE 60 MG/2ML IM SOLN
60.0000 mg | Freq: Once | INTRAMUSCULAR | Status: AC
  Administered 2012-02-27: 60 mg via INTRAMUSCULAR
  Filled 2012-02-27: qty 2

## 2012-02-27 MED ORDER — NAPROXEN 500 MG PO TABS: 500.0000 mg | ORAL_TABLET | Freq: Two times a day (BID) | ORAL | Status: DC

## 2012-02-27 MED ORDER — SULFAMETHOXAZOLE-TMP DS 800-160 MG PO TABS
1.0000 | ORAL_TABLET | Freq: Once | ORAL | Status: AC
  Administered 2012-02-27: 1 via ORAL
  Filled 2012-02-27: qty 1

## 2012-02-27 MED ORDER — SULFAMETHOXAZOLE-TRIMETHOPRIM 800-160 MG PO TABS: 1.0000 | ORAL_TABLET | Freq: Two times a day (BID) | ORAL | Status: DC

## 2012-02-27 NOTE — ED Notes (Signed)
Family at bedside. 

## 2012-02-27 NOTE — ED Provider Notes (Signed)
History     CSN: [id number]  Arrival date & time 02/26/12  2221   First MD Initiated Contact with Patient 02/27/12 0102      Chief Complaint  Patient presents with  . Insect Bite  . Chest Pain    (Consider location/radiation/quality/duration/timing/severity/associated sxs/prior treatment) HPI Comments: 21 year old female with a history of no significant past medical history who presents with 2 complaints  #1 chest pain which she states she has had for 2 days, is intermittent, 2-3 times per day lasting one to 2 minutes at a time. It is either a sharp pain or heavy pain, nothing seems to make it better or worse and she notes that she is very active and exertion does not make his pain worse. She has no shortness of breath at this time, no swelling of her legs other than a very small area to the right lateral thigh which has been draining pus.  #2 right lateral thigh pain: The patient states she has had approximately one week of pain and redness with swelling to the right lateral thigh which is gradually getting worse until 24 hours ago when the area spontaneously ruptured and has been draining pus. It has improved significantly since that time and she denies any fevers chills nausea or vomiting.  Patient is a 21 y.o. female presenting with chest pain. The history is provided by the patient.  Chest Pain     Past Medical History  Diagnosis Date  . No pertinent past medical history   . Obstruction of colon     Past Surgical History  Procedure Date  . Anterior cruciate ligament repair   . Anterior cruciate ligament repair     Family History  Problem Relation Age of Onset  . Asthma Sister   . Cancer Maternal Grandmother     History  Substance Use Topics  . Smoking status: Never Smoker   . Smokeless tobacco: Not on file  . Alcohol Use: No    OB History    Grav Para Term Preterm Abortions TAB SAB Ect Mult Living   0 0              Review of Systems  Cardiovascular:  Positive for chest pain.  All other systems reviewed and are negative.    Allergies  Other  Home Medications   Current Outpatient Rx  Name Route Sig Dispense Refill  . ETONOGESTREL 68 MG [place] IMPL Subcutaneous Inject 1 each into the skin once.    Marland Kitchen NAPROXEN 500 MG PO TABS Oral Take 1 tablet (500 mg total) by mouth 2 (two) times daily with a meal. 30 tablet 0  . SULFAMETHOXAZOLE-TRIMETHOPRIM 800-160 MG PO TABS Oral Take 1 tablet by mouth every 12 (twelve) hours. 20 tablet 0    BP 112/76  Pulse 78  Temp 99 F (37.2 C) (Oral)  Resp 17  SpO2 100%  LMP 01/28/2011  Physical Exam  Nursing note and vitals reviewed. Constitutional: She appears well-developed and well-nourished. No distress.  HENT:  Head: Normocephalic and atraumatic.  Mouth/Throat: Oropharynx is clear and moist. No oropharyngeal exudate.  Eyes: Conjunctivae normal and EOM are normal. Pupils are equal, round, and reactive to light. Right eye exhibits no discharge. Left eye exhibits no discharge. No scleral icterus.  Neck: Normal range of motion. Neck supple. No JVD present. No thyromegaly present.  Cardiovascular: Normal rate, regular rhythm, normal heart sounds and intact distal pulses.  Exam reveals no gallop and no friction rub.   No murmur  heard. Pulmonary/Chest: Effort normal and breath sounds normal. No respiratory distress. She has no wheezes. She has no rales.  Abdominal: Soft. Bowel sounds are normal. She exhibits no distension and no mass. There is no tenderness.  Musculoskeletal: Normal range of motion. She exhibits tenderness ( Mild tenderness to the right lateral thigh with an area of 2 inches of erythema with no induration and a central draining abscess collection. A very small amount of purulent material is expressed from the central wound which is approximately 0.5 cm open). She exhibits no edema.  Lymphadenopathy:    She has no cervical adenopathy.  Neurological: She is alert. Coordination normal.  Skin:  Skin is warm and dry. No rash noted. No erythema.  Psychiatric: She has a normal mood and affect. Her behavior is normal.    ED Course  Procedures (including critical care time)  Labs Reviewed  BASIC METABOLIC PANEL - Abnormal; Notable for the following:    Glucose, Bld 109 (*)     All other components within normal limits  URINALYSIS, ROUTINE W REFLEX MICROSCOPIC - Abnormal; Notable for the following:    APPearance CLOUDY (*)     Hgb urine dipstick TRACE (*)     Leukocytes, UA LARGE (*)     All other components within normal limits  URINE MICROSCOPIC-ADD ON - Abnormal; Notable for the following:    Squamous Epithelial / LPF MANY (*)     Bacteria, UA FEW (*)     All other components within normal limits  CBC  PRO B NATRIURETIC PEPTIDE  POCT PREGNANCY, URINE  POCT I-STAT TROPONIN I   Dg Chest 2 View  02/27/2012  *RADIOLOGY REPORT*  Clinical Data: Chest pain, insect bite on leg x1 week  CHEST - 2 VIEW  Comparison: None.  Findings: Lungs are clear. No pleural effusion or pneumothorax.  Cardiomediastinal silhouette is within normal limits.  Visualized osseous structures are within normal limits.  IMPRESSION: Normal chest radiographs.   Original Report Authenticated By: Charline Bills, M.D.      1. Chest pain   2. Abscess of right thigh       MDM  Cardiac and pulmonary exams are normal, laboratory workup reveals that she has a likely urinary infection with too numerous to count white blood cells and a few bacteria. She also has a cellulitis of her right thigh with a draining abscess for which she will be treated with Bactrim. This should cover both infections. She has a clear chest x-ray, normal troponin and an EKG which is totally normal. I doubt that this is cardiac in etiology. Her pulse is 79 and she has no peripheral edema, I doubt that she has a pulmonary embolism. She appears stable for discharge and will followup in 2 days for a recheck.  ED ECG REPORT  I personally  interpreted this EKG   Date: 02/27/2012   Rate: 103  Rhythm: sinus tachycardia  QRS Axis: normal  Intervals: normal  ST/T Wave abnormalities: normal  Conduction Disutrbances:none  Narrative Interpretation:   Old EKG Reviewed: none available   Discharge Prescriptions include:   Naprosyn  Bactrim         Vida Roller, MD 02/27/12 0120

## 2012-02-27 NOTE — ED Notes (Signed)
Patient is alert and orientedx4.  Patient's mother is coming to take patient home.  Patient had no questions about her discharge instructions.

## 2012-02-27 NOTE — ED Notes (Signed)
Patient is resting comfortably. 

## 2012-02-27 NOTE — ED Notes (Signed)
Vital signs stable. 

## 2013-07-17 ENCOUNTER — Emergency Department (HOSPITAL_COMMUNITY): Payer: Medicaid Other

## 2013-07-17 ENCOUNTER — Encounter (HOSPITAL_COMMUNITY): Payer: Self-pay | Admitting: Emergency Medicine

## 2013-07-17 ENCOUNTER — Emergency Department (HOSPITAL_COMMUNITY)
Admission: EM | Admit: 2013-07-17 | Discharge: 2013-07-17 | Disposition: A | Discharge status: Home / Self Care | Payer: Medicaid Other | Attending: Emergency Medicine | Admitting: Emergency Medicine

## 2013-07-17 DIAGNOSIS — Z8719 Personal history of other diseases of the digestive system: Secondary | ICD-10-CM | POA: Insufficient documentation

## 2013-07-17 DIAGNOSIS — R072 Precordial pain: Secondary | ICD-10-CM | POA: Insufficient documentation

## 2013-07-17 DIAGNOSIS — R079 Chest pain, unspecified: Secondary | ICD-10-CM

## 2013-07-17 LAB — CBC
HEMATOCRIT: 34.8 % — AB (ref 36.0–46.0)
HEMOGLOBIN: 12 g/dL (ref 12.0–15.0)
MCH: 29.3 pg (ref 26.0–34.0)
MCHC: 34.5 g/dL (ref 30.0–36.0)
MCV: 85.1 fL (ref 78.0–100.0)
Platelets: 336 10*3/uL (ref 150–400)
RBC: 4.09 MIL/uL (ref 3.87–5.11)
RDW: 13.1 % (ref 11.5–15.5)
WBC: 7.4 10*3/uL (ref 4.0–10.5)

## 2013-07-17 LAB — I-STAT TROPONIN, ED: Troponin i, poc: 0 ng/mL (ref 0.00–0.08)

## 2013-07-17 LAB — BASIC METABOLIC PANEL
BUN: 9 mg/dL (ref 6–23)
CO2: 27 mEq/L (ref 19–32)
CREATININE: 0.61 mg/dL (ref 0.50–1.10)
Calcium: 9 mg/dL (ref 8.4–10.5)
Chloride: 104 mEq/L (ref 96–112)
GFR calc Af Amer: >90 mL/min (ref >90)
GLUCOSE: 115 mg/dL — AB (ref 70–99)
POTASSIUM: 3.7 meq/L (ref 3.7–5.3)
Sodium: 143 mEq/L (ref 137–147)

## 2013-07-17 NOTE — ED Notes (Signed)
Pt presents with mid-sternum chest pain starting today at 1600. Pt denies any other symptoms associated with chest pain

## 2013-07-17 NOTE — Discharge Instructions (Signed)
Chest Pain (Nonspecific) °It is often hard to give a specific diagnosis for the cause of chest pain. There is always a chance that your pain could be related to something serious, such as a heart attack or a blood clot in the lungs. You need to follow up with your caregiver for further evaluation. °CAUSES  °· Heartburn. °· Pneumonia or bronchitis. °· Anxiety or stress. °· Inflammation around your heart (pericarditis) or lung (pleuritis or pleurisy). °· A blood clot in the lung. °· A collapsed lung (pneumothorax). It can develop suddenly on its own (spontaneous pneumothorax) or from injury (trauma) to the chest. °· Shingles infection (herpes zoster virus). °The chest wall is composed of bones, muscles, and cartilage. Any of these can be the source of the pain. °· The bones can be bruised by injury. °· The muscles or cartilage can be strained by coughing or overwork. °· The cartilage can be affected by inflammation and become sore (costochondritis). °DIAGNOSIS  °Lab tests or other studies, such as X-rays, electrocardiography, stress testing, or cardiac imaging, may be needed to find the cause of your pain.  °TREATMENT  °· Treatment depends on what may be causing your chest pain. Treatment may include: °· Acid blockers for heartburn. °· Anti-inflammatory medicine. °· Pain medicine for inflammatory conditions. °· Antibiotics if an infection is present. °· You may be advised to change lifestyle habits. This includes stopping smoking and avoiding alcohol, caffeine, and chocolate. °· You may be advised to keep your head raised (elevated) when sleeping. This reduces the chance of acid going backward from your stomach into your esophagus. °· Most of the time, nonspecific chest pain will improve within 2 to 3 days with rest and mild pain medicine. °HOME CARE INSTRUCTIONS  °· If antibiotics were prescribed, take your antibiotics as directed. Finish them even if you start to feel better. °· For the next few days, avoid physical  activities that bring on chest pain. Continue physical activities as directed. °· Do not smoke. °· Avoid drinking alcohol. °· Only take over-the-counter or prescription medicine for pain, discomfort, or fever as directed by your caregiver. °· Follow your caregiver's suggestions for further testing if your chest pain does not go away. °· Keep any follow-up appointments you made. If you do not go to an appointment, you could develop lasting (chronic) problems with pain. If there is any problem keeping an appointment, you must call to reschedule. °SEEK MEDICAL CARE IF:  °· You think you are having problems from the medicine you are taking. Read your medicine instructions carefully. °· Your chest pain does not go away, even after treatment. °· You develop a rash with blisters on your chest. °SEEK IMMEDIATE MEDICAL CARE IF:  °· You have increased chest pain or pain that spreads to your arm, neck, jaw, back, or abdomen. °· You develop shortness of breath, an increasing cough, or you are coughing up blood. °· You have severe back or abdominal pain, feel nauseous, or vomit. °· You develop severe weakness, fainting, or chills. °· You have a fever. °THIS IS AN EMERGENCY. Do not wait to see if the pain will go away. Get medical help at once. Call your local emergency services (911 in [place] Do not drive yourself to the hospital. °MAKE SURE YOU:  °· Understand these instructions. °· Will watch your condition. °· Will get help right away if you are not doing well or get worse. °Document Released: 02/03/2005 Document Revised: 07/19/2011 Document Reviewed: 11/30/2007 °ExitCare® Patient Information ©2014 [employer],   LLC. ° ° ° °Emergency Department Resource Guide °1) Find a Doctor and Pay Out of Pocket °Although you won't have to find out who is covered by your insurance plan, it is a good idea to ask around and get recommendations. You will then need to call the office and see if the doctor you have chosen will accept you as a new  patient and what types of options they offer for patients who are self-pay. Some doctors offer discounts or will set up payment plans for their patients who do not have insurance, but you will need to ask so you aren't surprised when you get to your appointment. ° °2) Contact Your Local Health Department °Not all health departments have doctors that can see patients for sick visits, but many do, so it is worth a call to see if yours does. If you don't know where your local health department is, you can check in your phone book. The CDC also has a tool to help you locate your state's health department, and many state websites also have listings of all of their local health departments. ° °3) Find a Walk-in Clinic °If your illness is not likely to be very severe or complicated, you may want to try a walk in clinic. These are popping up all over the country in pharmacies, drugstores, and shopping centers. They're usually staffed by nurse practitioners or physician assistants that have been trained to treat common illnesses and complaints. They're usually fairly quick and inexpensive. However, if you have serious medical issues or chronic medical problems, these are probably not your best option. ° °No Primary Care Doctor: °- Call [facility] at  832-8000 - they can help you locate a primary care doctor that  accepts your insurance, provides certain services, etc. °- Physician Referral Service- 1-800-533-3463 ° °Chronic Pain Problems: °Organization         Address  Phone   Notes  ° [facility]  (336) 297-2271 Patients need to be referred by their primary care doctor.  ° °Medication Assistance: °Organization         Address  Phone   Notes  °[place] [facility] [address] °[place], [place] [address] (336) 641-8030 --Must be a resident of [place] °-- Must have NO insurance coverage whatsoever (no Medicaid/ Medicare, etc.) °-- The pt. MUST have a primary  care doctor that directs their care regularly and follows them in the community °  °[facility]  (866) 331-1348   °[employer]  (888) 892-1162   ° °Agencies that provide inexpensive medical care: °Organization         Address  Phone   Notes  °[facility]  (336) 832-8035   ° Internal Medicine    (336) 832-7272   °[facility] [address] °[place], [place] [address] (336) 832-4777   °[facility] [address], °[place] (336) 271-4999   °[facility]    (336) 373-0678   °[facility]    (336) 272-1050   °[facility] ° [address], [place] Phone:  (336) 832-4444, Fax:  (336) 832-4440 Hours of Operation:  9 am - 6 pm, M-F.  Also accepts Medicaid/Medicare and self-pay.  °[facility] ° [address], [place] Phone: (336) [phone], Fax: (336) [phone]. Hours of Operation:  8:30 am - 5:30 pm, M-F.  Also accepts Medicaid and self-pay.  °  [facility] [place] [address], [place] Phone: (336) 878-6027   °[facility] [address], [place], [place] (336)723-1848, Ext. 123 Mondays & Thursdays: 7-9 AM.  First 15 patients are seen on a first come, first serve basis. °  ° °Medicaid-accepting [place] Providers: ° °Organization         Address  Phone   Notes  °[facility] [address], [place] (336) 641-2100 Also accepts self-pay patients.  °[facility] [address], [place] ° (336) 856-9996   °[facility] [address], [place] (336) 288-8857   °[facility] [address] [place] [address], [place] (336) 299-7000   °Veita Bland [address], [place]  ° (336) 373-1557 Only accepts [place] Access Medicaid patients after they have their name applied to their card.  ° °Self-Pay (no insurance) in [place]: ° °Organization         Address  Phone   Notes  °Sickle Cell Patients, [facility] [address], [place] (336) 832-1970   °[facility] [address], [place] (336) 832-4400   °[facility] ° [address], [place] (336) 992-4800   °[facility]/Dr. Osei-Bonsu ° [address] [place] [address], [place] or [address], [place] (336) [phone] Phone number for both [place] and [place] locations is the same.  °[facility] [address], [place] (336) 299-0000   °[facility] [address] [place] [address], [place] or [address] (336) 852-7530 °(336) 878-2260   °[facility] [address], [place] (336) 350-1642, phone; (336) 294-5005, fax Sees patients 1st and 3rd Saturday of every month.  Must not qualify for public or private insurance (i.e. Medicaid, Medicare, [place] Health Choice, Veterans' Benefits) • Household income should be no more than 200% of the poverty level •The clinic cannot treat you if you are pregnant or think you are pregnant • Sexually transmitted diseases are not treated at the clinic.  ° ° °Dental Care: °Organization         Address  Phone  Notes  °[facility] [address], [place] (336) 641-6152 Accepts children up to age 21 who are enrolled in Medicaid or [place] Health Choice; pregnant women with a Medicaid card; and children who have applied for Medicaid or [place] Health Choice, but were declined, whose parents can pay a reduced fee at time of service.  °[facility] [place]  [address], [place] (336) 641-7733 Accepts children up to age 21 who are enrolled in Medicaid or [place] Health Choice; pregnant women with a Medicaid card; and children who have applied for Medicaid or [place] Health Choice, but were declined, whose parents can  pay a reduced fee at time of service.  °[facility] ° [address], [place] (336) 641-4533 Patients are seen by appointment only. Walk-ins are not accepted. [facility] will see patients 18 years of age and older. °Monday - Tuesday (8am-5pm) °Most Wednesdays (8:30-5pm) °$30 per visit, cash only  °[facility] ° [address], [place] (336) 641-4533 Patients are seen by appointment only. Walk-ins are not accepted. [facility] will see patients 18 years of   age and older. °One Wednesday Evening (Monthly: Volunteer Based).  $30 per visit, cash only  °[facility]  (919) 537-3737 for adults; Children under age 4, call Graduate Pediatric Dentistry at ([phone]) [phone]. Children aged 4-14, please call ([phone]) [phone] to request a pediatric application. ° Dental services are provided in all areas of dental care including fillings, crowns and bridges, complete and partial dentures, implants, gum treatment, root canals, and extractions. Preventive care is also provided. Treatment is provided to both adults and children. °Patients are selected via a lottery and there is often a waiting list. °  °[facility] [address], °[place] ° (336) 763-8833 [email or web address] °  °[facility] [address], [place], [place] (336)723-1848, Ext. 123 Second and Fourth Thursday of each month, opens at 6:30 AM; Clinic ends at 9 AM.  Patients are seen on a first-come first-served basis, and a limited number are seen during each clinic.  ° °[facility] ° [address], [place], [place] (336) 723-7904   Eligibility Requirements °You must have lived in [place], [place], or [place] counties for at least the last three months. °  You cannot be eligible for state or federal sponsored healthcare insurance, including Veterans Administration, Medicaid, or Medicare. °  You generally cannot be eligible for healthcare  insurance through your employer.  °  How to apply: °Eligibility screenings are held every Tuesday and Wednesday afternoon from 1:00 pm until 4:00 pm. You do not need an appointment for the interview!  °[facility] [address], [place], [place] 336-631-2330   °[facility]  336-342-8273   °[facility]  336-703-3100   °[facility]  336-570-6415   ° °Behavioral Health Resources in the Community: °Intensive Outpatient Programs °Organization         Address  Phone  Notes  °[facility] [address], [place], [place] 336-878-6098   °[facility] [address], [place], [place] 336-832-9800   °[facility] [address], [place], [place] ° 336-882-2125   °[facility] [address],  °[place], [place] 1-800-853-5163 or 336-641-4981   °Substance Abuse Resources °Organization         Address  Phone  Notes  °[facility]  336-882-2125   °[facility]  336-784-9470   °[facility] [place] [facility]  336-285-9073   °[facility]  336-845-3988   °Residential & Outpatient Substance Abuse Program  1-800-659-3381   °Psychological Services °Organization         Address  Phone  Notes  °[facility]  336- 832-9600   °[facility]  336- 378-7881   °[facility] [address], [place] 1-800-853-5163 or 336-641-4981   ° °Mobile Crisis Teams °Organization         Address  Phone  Notes  °[facility], Mobile Crisis Care Unit  1-877-626-1772   °Assertive °[facility] ° [address] [place], [place] 336-834-9664   °Sharon DeEsch [address] °[place] 336-554-5454   ° °Self-Help/Support Groups °Organization         Address  Phone             Notes  °[facility]  - variety of support groups  336- 373-1402 Call for more information  °Narcotics Anonymous (NA),  [facility] [address], °[place]   2   meetings at this location  ° °Residential Treatment Programs °Organization         Address  Phone  Notes  °[facility] [address],    °[place]  1-866-801-8205   °[facility] ° [address], [place], [place] 704-293-8524   °[facility] [address], [place] 336-845-3988 Admissions: 8am-3pm M-F  °[facility] [address]    °[place], [place] 336-841-1104   °[facility] [address], [place], [place] 336-379-7146   °[facility] [place] [facility] [address]  °[place], [place] 336-285-9073   °[facility] Intensive Outpatient [address], [place], [place] 336-852-3033   °[facility] [address]  °[place], [place] 1-877-615-2722 or 336-784-9470   °[facility] ([facility] [address] [place], [place] 336-227-7417 Accepts Medicaid  °[facility] [address]  °[place] 1-800-659-3381 Substance Abuse/Addiction Treatment  ° °[place] Behavioral Health Resources °Organization         Address  Phone  Notes  °[facility]  (888) 581-9988   °Julie Brannon, PhD [address] [place], [place]   (336) 349-5553 or (336) 951-0000   °[facility]   [address] °[place], [place] (336) 349-4454   °[facility] [address], [place], [place] (336) 342-8316 Insurance/Medicaid/sponsorship through [facility]  °[facility] [address]                                    [place], [place] (336) 342-8316 Therapy/tele-psych/case  °[facility] [address]  ° [place], [place] (336) 349-2233    °Dr. Arfeen  (336) 349-4544   °[facility] [place]  [employer] [place] [facility] 1) [address], [place] °2) [address], [place] °3)  [address], [place] (336) 349-3220 °(336) 342-7768 ° °(336) 342-8140    °[place] Child Abuse [facility] (336) 342-1394 or (336) 342-3537 (After Hours)    ° ° ° °

## 2013-07-17 NOTE — ED Provider Notes (Signed)
CSN: [id number]     Arrival date & time 07/17/13  1833 History   First MD Initiated Contact with Patient 07/17/13 2131     Chief Complaint  Patient presents with  . Chest Pain    (Consider location/radiation/quality/duration/timing/severity/associated sxs/prior Treatment) Patient is a 23 y.o. female presenting with chest pain. The history is provided by the patient. No language interpreter was used.  Chest Pain Pain location:  Substernal area Pain quality: sharp   Pain radiates to:  Does not radiate Pain radiates to the back: no   Pain severity:  Mild Onset quality:  Gradual Duration:  6 hours Timing:  Constant Progression:  Improving Chronicity:  Recurrent (hx of same x "months") Relieved by:  None tried Worsened by:  Movement Ineffective treatments:  None tried Associated symptoms: no abdominal pain, no back pain, no cough, no dysphagia, no fever, no heartburn, no nausea, no numbness, no palpitations, no shortness of breath, no syncope, not vomiting and no weakness   Risk factors: birth control   Risk factors: no coronary artery disease, no diabetes mellitus, no high cholesterol, no hypertension, no prior DVT/PE and no smoking     Past Medical History  Diagnosis Date  . No pertinent past medical history   . Obstruction of colon    Past Surgical History  Procedure Laterality Date  . Anterior cruciate ligament repair    . Anterior cruciate ligament repair     Family History  Problem Relation Age of Onset  . Asthma Sister   . Cancer Maternal Grandmother    History  Substance Use Topics  . Smoking status: Never Smoker   . Smokeless tobacco: Not on file  . Alcohol Use: No   OB History   Grav Para Term Preterm Abortions TAB SAB Ect Mult Living   0 0             Review of Systems  Constitutional: Negative for fever.  HENT: Negative for trouble swallowing.   Respiratory: Negative for cough and shortness of breath.   Cardiovascular: Positive for chest pain.  Negative for palpitations and syncope.  Gastrointestinal: Negative for heartburn, nausea, vomiting and abdominal pain.  Musculoskeletal: Negative for back pain.  Neurological: Negative for syncope, weakness and numbness.  All other systems reviewed and are negative.     Allergies  Other  Home Medications   Current Outpatient Rx  Name  Route  Sig  Dispense  Refill  . etonogestrel (IMPLANON) 68 MG IMPL implant   Subcutaneous   Inject 1 each into the skin once.          BP 117/76  Pulse 74  Temp(Src) 98.2 F (36.8 C) (Oral)  Resp 20  Ht 5\' 2"  (1.575 m)  Wt 158 lb (71.668 kg)  BMI 28.89 kg/m2  SpO2 100%  Physical Exam  Nursing note and vitals reviewed. Constitutional: She is oriented to person, place, and time. She appears well-developed and well-nourished. No distress.  Patient presents and smiling, joking in room with mother. She is in no acute distress.  HENT:  Head: Normocephalic and atraumatic.  Mouth/Throat: Oropharynx is clear and moist. No oropharyngeal exudate.  Eyes: Conjunctivae and EOM are normal. Pupils are equal, round, and reactive to light. No scleral icterus.  Neck: Normal range of motion.  Cardiovascular: Normal rate, regular rhythm, normal heart sounds and intact distal pulses.   Distal radial pulses 2+ bilaterally  Pulmonary/Chest: Effort normal and breath sounds normal. No respiratory distress. She has no wheezes. She has  no rales.  Chest expansion symmetric. No tachypnea, dyspnea, or accessory muscle use noted.  Abdominal: Soft. She exhibits no distension. There is no tenderness. There is no rebound and no guarding.  Abdomen soft and nontender  Musculoskeletal: Normal range of motion.  Neurological: She is alert and oriented to person, place, and time.  Skin: Skin is warm and dry. No rash noted. She is not diaphoretic. No erythema. No pallor.  Psychiatric: She has a normal mood and affect. Her behavior is normal.    ED Course  Procedures  (including critical care time) Labs Review Labs Reviewed  CBC - Abnormal; Notable for the following:    HCT 34.8 (*)    All other components within normal limits  BASIC METABOLIC PANEL - Abnormal; Notable for the following:    Glucose, Bld 115 (*)    All other components within normal limits  I-STAT TROPOININ, ED   Imaging Review Dg Chest 2 View  07/17/2013   CLINICAL DATA Chest pain  EXAM CHEST  2 VIEW  COMPARISON February 26, 2012  FINDINGS The heart size and mediastinal contours are within normal limits. Both lungs are clear. The visualized skeletal structures are unremarkable.  IMPRESSION No active cardiopulmonary disease.  SIGNATURE  Electronically Signed   By: Sherian Rein M.D.   On: 07/17/2013 19:37     EKG Interpretation None      MDM   Final diagnoses:  Chest pain    23 year old female with no significant past medical history presents for chest pain intermittent over the last few months. Patient states the chest pain has been constant since this morning. On initial presentation, patient is joking in the room with her mother. She is in no acute distress and well and nontoxic appearing. Chest pain today is reproducible on palpation to the anterior mid chest wall. Patient also states that pain is worse with movement. She denies any associated symptoms.  My suspicion for ACS was low in this patient given her atypical nature of symptoms, lack of risk factors and age, and negative cardiac workup today. My suspicion for pulmonary embolism is also low given lack of tachycardia, tachypnea, dyspnea, or hypoxia. Well's PE score 0. Suspect chest pain to be musculoskeletal in nature. I do not believe further emergent workup is indicated at this time. Patient is stable and appropriate for discharge today with instruction to followup with her primary care provider. Return precautions discussed and provided. Patient agreeable to plan with no unaddressed concerns.   Filed Vitals:   07/17/13  1837 07/17/13 2207  BP: 117/76 121/87  Pulse: 74 74  Temp: 98.2 F (36.8 C)   TempSrc: Oral   Resp: 20 16  Height: 5\' 2"  (1.575 m)   Weight: 158 lb (71.668 kg)   SpO2: 100% 99%       Antony Madura, PA-C 07/17/13 2322

## 2013-07-17 NOTE — ED Notes (Signed)
Patient discharged with Mother. Denies any pain at this time. Pt is ambulatory.

## 2013-07-18 NOTE — ED Provider Notes (Addendum)
Medical screening examination/treatment/procedure(s) were performed by non-physician practitioner and as supervising physician I was immediately available for consultation/collaboration.   EKG Interpretation   Date/Time:  Tuesday July 17 2013 18:37:43 EDT Ventricular Rate:  89 PR Interval:  124 QRS Duration: 78 QT Interval:  340 QTC Calculation: 413 R Axis:   -4 Text Interpretation:  Normal sinus rhythm Left ventricular hypertrophy  Nonspecific T wave abnormality Abnormal ECG Since last tracing T wave  inversion in III is new Confirmed by Bernette MayersSHELDON  MD, CHARLES [id number]) on  07/17/2013 10:05:28 PM        Bonnita Levanharles B. Bernette MayersSheldon, MD 07/26/13 2001

## 2013-09-23 ENCOUNTER — Emergency Department (HOSPITAL_COMMUNITY)
Admission: EM | Admit: 2013-09-23 | Discharge: 2013-09-23 | Disposition: A | Discharge status: Home / Self Care | Payer: Medicaid Other | Attending: Emergency Medicine | Admitting: Emergency Medicine

## 2013-09-23 ENCOUNTER — Encounter (HOSPITAL_COMMUNITY): Payer: Self-pay | Admitting: Emergency Medicine

## 2013-09-23 DIAGNOSIS — L0291 Cutaneous abscess, unspecified: Secondary | ICD-10-CM

## 2013-09-23 DIAGNOSIS — L02419 Cutaneous abscess of limb, unspecified: Secondary | ICD-10-CM | POA: Insufficient documentation

## 2013-09-23 DIAGNOSIS — Z8719 Personal history of other diseases of the digestive system: Secondary | ICD-10-CM | POA: Insufficient documentation

## 2013-09-23 DIAGNOSIS — L03119 Cellulitis of unspecified part of limb: Principal | ICD-10-CM

## 2013-09-23 MED ORDER — SULFAMETHOXAZOLE-TRIMETHOPRIM 800-160 MG PO TABS: 2.0000 | ORAL_TABLET | Freq: Two times a day (BID) | ORAL | Status: DC

## 2013-09-23 NOTE — ED Notes (Signed)
Pt reports having "knot" to left inner thigh x 2 days.

## 2013-09-23 NOTE — ED Provider Notes (Signed)
CSN: [id number]     Arrival date & time 09/23/13  1046 History  This chart was scribed for non-physician practitioner, Santiago GladHeather Eleasha Cataldo , working with Shelda JakesScott W. Zackowski, MD, by Tana ConchStephen Methvin ED Scribe. This patient was seen in TR07C/TR07C and the patient's care was started at 11:39 AM.   Chief Complaint  Patient presents with  . Abscess      The history is provided by the patient. No language interpreter was used.     HPI Comments: April StaggersKeisha L Crane is a 23 y.o. female who presents to the Emergency Department complaining of an abscess on her left inner thigh, she reports that it is painful and is getting bigger. She states that she noticed the abscess two days ago and that its tender to touch. She has not noticed any drainage.  No treatment prior to arrival.  Pt denies fever, chills, HIV, and h/o abscesses.  Past Medical History  Diagnosis Date  . No pertinent past medical history   . Obstruction of colon    Past Surgical History  Procedure Laterality Date  . Anterior cruciate ligament repair    . Anterior cruciate ligament repair     Family History  Problem Relation Age of Onset  . Asthma Sister   . Cancer Maternal Grandmother    History  Substance Use Topics  . Smoking status: Never Smoker   . Smokeless tobacco: Not on file  . Alcohol Use: No   OB History   Grav Para Term Preterm Abortions TAB SAB Ect Mult Living   0 0             Review of Systems  All other systems reviewed and are negative.     Allergies  Other  Home Medications   Prior to Admission medications   Medication Sig Start Date End Date Taking? Authorizing Provider  etonogestrel (IMPLANON) 68 MG IMPL implant Inject 1 each into the skin once.    Historical Provider, MD   There were no vitals taken for this visit. Physical Exam  Nursing note and vitals reviewed. Constitutional: She is oriented to person, place, and time. She appears well-developed and well-nourished.  HENT:  Head:  Normocephalic and atraumatic.  Eyes: EOM are normal.  Neck: Normal range of motion.  Cardiovascular: Normal rate, regular rhythm and normal heart sounds.   Pulmonary/Chest: Effort normal and breath sounds normal.  Musculoskeletal: Normal range of motion.  Neurological: She is alert and oriented to person, place, and time.  Skin:  2 cm abscess of the left upper inner thigh. No active drainage.  Mild surrounding erythema and warmth.  Psychiatric: She has a normal mood and affect.    ED Course  Procedures (including critical care time)   COORDINATION OF CARE:   11:43 AM-Discussed treatment plan which includes draining the abscess with pt at bedside and pt agreed to plan.   Labs Review Labs Reviewed - No data to display  Imaging Review No results found.   EKG Interpretation None      INCISION AND DRAINAGE Performed by: Santiago GladHeather Karrisa Didio Consent: Verbal consent obtained. Risks and benefits: risks, benefits and alternatives were discussed Type: abscess  Body area: left upper thigh  Anesthesia: local infiltration  Incision was made with a scalpel.  Local anesthetic: lidocaine 2% with epinephrine  Anesthetic total: 3 ml  Complexity: complex Blunt dissection to break up loculations  Drainage: purulent  Drainage amount: small  Patient tolerance: Patient tolerated the procedure well with no immediate complications.  MDM   Final diagnoses:  None   Patient with skin abscess amenable to incision and drainage.  Abscess was not large enough to warrant packing or drain.   Encouraged warm compresses.  Mild signs of cellulitis is surrounding skin.  Will d/c to home.  Return precautions given.   I personally performed the services described in this documentation, which was scribed in my presence. The recorded information has been reviewed and is accurate.  Santiago GladHeather Zetta Stoneman, PA-C 09/23/13 1221

## 2013-09-25 NOTE — ED Provider Notes (Signed)
Medical screening examination/treatment/procedure(s) were performed by non-physician practitioner and as supervising physician I was immediately available for consultation/collaboration.   EKG Interpretation None       Karl Erway W. Kasumi Ditullio, MD 09/25/13 0718 

## 2013-11-13 ENCOUNTER — Emergency Department (HOSPITAL_COMMUNITY)
Admission: EM | Admit: 2013-11-13 | Discharge: 2013-11-13 | Disposition: A | Discharge status: Home / Self Care | Payer: Medicaid Other | Attending: Emergency Medicine | Admitting: Emergency Medicine

## 2013-11-13 ENCOUNTER — Encounter (HOSPITAL_COMMUNITY): Payer: Self-pay | Admitting: Emergency Medicine

## 2013-11-13 ENCOUNTER — Emergency Department (HOSPITAL_COMMUNITY): Payer: Medicaid Other

## 2013-11-13 DIAGNOSIS — Z79899 Other long term (current) drug therapy: Secondary | ICD-10-CM | POA: Insufficient documentation

## 2013-11-13 DIAGNOSIS — R079 Chest pain, unspecified: Secondary | ICD-10-CM

## 2013-11-13 DIAGNOSIS — Z8719 Personal history of other diseases of the digestive system: Secondary | ICD-10-CM | POA: Insufficient documentation

## 2013-11-13 DIAGNOSIS — Z7982 Long term (current) use of aspirin: Secondary | ICD-10-CM | POA: Insufficient documentation

## 2013-11-13 DIAGNOSIS — R071 Chest pain on breathing: Secondary | ICD-10-CM | POA: Insufficient documentation

## 2013-11-13 LAB — BASIC METABOLIC PANEL
Anion gap: 15 (ref 5–15)
BUN: 8 mg/dL (ref 6–23)
CHLORIDE: 102 meq/L (ref 96–112)
CO2: 24 meq/L (ref 19–32)
CREATININE: 0.77 mg/dL (ref 0.50–1.10)
Calcium: 9.5 mg/dL (ref 8.4–10.5)
GFR calc Af Amer: >90 mL/min (ref >90)
GFR calc non Af Amer: >90 mL/min (ref >90)
Glucose, Bld: 85 mg/dL (ref 70–99)
Potassium: 4.2 mEq/L (ref 3.7–5.3)
Sodium: 141 mEq/L (ref 137–147)

## 2013-11-13 LAB — I-STAT TROPONIN, ED: Troponin i, poc: 0 ng/mL (ref 0.00–0.08)

## 2013-11-13 LAB — CBC
HEMATOCRIT: 38.7 % (ref 36.0–46.0)
Hemoglobin: 13.2 g/dL (ref 12.0–15.0)
MCH: 29.1 pg (ref 26.0–34.0)
MCHC: 34.1 g/dL (ref 30.0–36.0)
MCV: 85.4 fL (ref 78.0–100.0)
Platelets: 322 10*3/uL (ref 150–400)
RBC: 4.53 MIL/uL (ref 3.87–5.11)
RDW: 13.4 % (ref 11.5–15.5)
WBC: 7.2 10*3/uL (ref 4.0–10.5)

## 2013-11-13 NOTE — ED Provider Notes (Signed)
CSN: [id number]     Arrival date & time 11/13/13  1456 History   First MD Initiated Contact with Patient 11/13/13 2010     Chief Complaint  Patient presents with  . Chest Pain     (Consider location/radiation/quality/duration/timing/severity/associated sxs/prior Treatment) HPI Comments: Patient is a 23 year old female who presents to the emergency department for chest pain. Patient states the chest pain began at 1 PM while she was in a park. She states the pain was central, sharp, and pressure-like. She states the pain was worsened with deep breathing and movement. Patient took 2 aspirin without relief. She states that chest pain has since spontaneously resolved. Patient denies associated fever, syncope or near syncope, shortness of breath, cough or hemoptysis, leg swelling, palpitations, nausea or vomiting, and numbness/weakness. Patient denies a personal history of ACS, DVT, PE, and diabetes mellitus. She endorses a family history of ACS in her grandmother at an old age.  Patient is a 23 y.o. female presenting with chest pain. The history is provided by the patient. No language interpreter was used.  Chest Pain   Past Medical History  Diagnosis Date  . No pertinent past medical history   . Obstruction of colon    Past Surgical History  Procedure Laterality Date  . Anterior cruciate ligament repair    . Anterior cruciate ligament repair     Family History  Problem Relation Age of Onset  . Asthma Sister   . Cancer Maternal Grandmother    History  Substance Use Topics  . Smoking status: Never Smoker   . Smokeless tobacco: Not on file  . Alcohol Use: No   OB History   Grav Para Term Preterm Abortions TAB SAB Ect Mult Living   0 0              Review of Systems  Cardiovascular: Positive for chest pain.  All other systems reviewed and are negative.    Allergies  Other  Home Medications   Prior to Admission medications   Medication Sig Start Date End Date Taking?  Authorizing Provider  ASPIRIN PO Take 2 tablets by mouth daily as needed (for pain).   Yes Historical Provider, MD  etonogestrel (IMPLANON) 68 MG IMPL implant Inject 1 each into the skin once.   Yes Historical Provider, MD  ketotifen (ZADITOR) 0.025 % ophthalmic solution Place 2 drops into both eyes daily.   Yes Historical Provider, MD   BP 124/87  Pulse 82  Temp(Src) 98.6 F (37 C) (Oral)  Resp 13  SpO2 100%  Physical Exam  Nursing note and vitals reviewed. Constitutional: She is oriented to person, place, and time. She appears well-developed and well-nourished. No distress.  Nontoxic/nonseptic appearing  HENT:  Head: Normocephalic and atraumatic.  Eyes: Conjunctivae and EOM are normal. No scleral icterus.  Neck: Normal range of motion. Neck supple.  Cardiovascular: Normal rate, regular rhythm, normal heart sounds and intact distal pulses.   No JVD  Pulmonary/Chest: Effort normal and breath sounds normal. No respiratory distress. She has no wheezes. She has no rales.  Chest expansion symmetric. No tachypnea or retractions. No chest tenderness to palpation.  Abdominal: Soft. She exhibits no distension. There is no tenderness. There is no rebound and no guarding.  Soft and nontender. No masses or peritoneal signs.  Musculoskeletal: Normal range of motion.  Neurological: She is alert and oriented to person, place, and time. She exhibits normal muscle tone. Coordination normal.  GCS 15. Patient moves extremities without ataxia.  Skin:  Skin is warm and dry. No rash noted. She is not diaphoretic. No erythema. No pallor.  Psychiatric: She has a normal mood and affect. Her behavior is normal.    ED Course  Procedures (including critical care time) Labs Review Labs Reviewed  CBC  BASIC METABOLIC PANEL  I-STAT TROPOININ, ED    Imaging Review Dg Chest 2 View  11/13/2013   CLINICAL DATA:  Right-sided chest pain for 3 days  EXAM: CHEST  2 VIEW  COMPARISON:  07/17/2013  FINDINGS: The  heart size and mediastinal contours are within normal limits. Both lungs are clear. The visualized skeletal structures are unremarkable.  Gaseous distention of the colon.  IMPRESSION: No active cardiopulmonary disease.   Electronically Signed   By: Elige KoHetal  Patel   On: 11/13/2013 15:51     Date: 11/13/2013  Rate: 104  Rhythm: sinus tachycardia  QRS Axis: normal  Intervals: normal  ST/T Wave abnormalities: normal  Conduction Disutrbances:none  Narrative Interpretation: NSR. No STEMI or ischemic change.  Old EKG Reviewed: none available I have personally reviewed and interpreted this EKG   MDM   Final diagnoses:  Chest pain, unspecified chest pain type    23 year old female presents for chest pain. Symptoms suggestive of costochondritis. Chest pain pleuritic in nature. Cardiac workup today is negative. No electrolyte imbalance. Chest x-ray shows no evidence of pneumonia, pneumothorax, pleural effusion, focal consolidation, or pneumonia. No mediastinal widening. Doubt ACS given age and atypical nature of symptoms. Heart score is 0. Doubt pulmonary embolism given lack of tachypnea, dyspnea, or hypoxia; Wells PE score 0-1.5 c/w low risk of PE. Patient stable for discharge with instruction followup with her primary care doctor. Have advised her to drink plenty of fluids as dehydration may also lead to similar pain. Return precautions discussed and provided. Patient agreeable to plan with no unaddressed concerns.   Filed Vitals:   11/13/13 1500 11/13/13 1817 11/13/13 1938  BP: 123/73 107/65 124/87  Pulse: 98 79 82  Temp: 98 F (36.7 C) 98.6 F (37 C)   TempSrc: Oral Oral   Resp: 18 18 13   SpO2: 100% 100% 100%    Antony MaduraKelly Gracious Renken, PA-C 11/13/13 2231  Antony MaduraKelly Rayburn Mundis, PA-C 11/13/13 2232

## 2013-11-13 NOTE — ED Notes (Signed)
Pt reports she was at the park and had onset of mid sharp chest pains, increased with movement. Pain relieved pta. ekg done at triage, no acute distress noted.

## 2013-11-13 NOTE — Discharge Instructions (Signed)
Be sure to drink plenty of fluids as your symptoms may be secondary to dehydration. Take ibuprofen as needed for persistent discomfort. Followup with your primary care doctor.  Costochondritis Costochondritis, sometimes called Tietze syndrome, is a swelling and irritation (inflammation) of the tissue (cartilage) that connects your ribs with your breastbone (sternum). It causes pain in the chest and rib area. Costochondritis usually goes away on its own over time. It can take up to 6 weeks or longer to get better, especially if you are unable to limit your activities. CAUSES  Some cases of costochondritis have no known cause. Possible causes include:  Injury (trauma).  Exercise or activity such as lifting.  Severe coughing. SIGNS AND SYMPTOMS  Pain and tenderness in the chest and rib area.  Pain that gets worse when coughing or taking deep breaths.  Pain that gets worse with specific movements. DIAGNOSIS  Your health care provider will do a physical exam and ask about your symptoms. Chest X-rays or other tests may be done to rule out other problems. TREATMENT  Costochondritis usually goes away on its own over time. Your health care provider may prescribe medicine to help relieve pain. HOME CARE INSTRUCTIONS   Avoid exhausting physical activity. Try not to strain your ribs during normal activity. This would include any activities using chest, abdominal, and side muscles, especially if heavy weights are used.  Apply ice to the affected area for the first 2 days after the pain begins.  Put ice in a plastic bag.  Place a towel between your skin and the bag.  Leave the ice on for 20 minutes, 2-3 times a day.  Only take over-the-counter or prescription medicines as directed by your health care provider. SEEK MEDICAL CARE IF:  You have redness or swelling at the rib joints. These are signs of infection.  Your pain does not go away despite rest or medicine. SEEK IMMEDIATE MEDICAL CARE  IF:   Your pain increases or you are very uncomfortable.  You have shortness of breath or difficulty breathing.  You cough up blood.  You have worse chest pains, sweating, or vomiting.  You have a fever or persistent symptoms for more than 2-3 days.  You have a fever and your symptoms suddenly get worse. MAKE SURE YOU:   Understand these instructions.  Will watch your condition.  Will get help right away if you are not doing well or get worse. Document Released: 02/03/2005 Document Revised: 02/14/2013 Document Reviewed: 11/28/2012 [facility] Patient Information 2015 [place], [place]. This information is not intended to replace advice given to you by your health care provider. Make sure you discuss any questions you have with your health care provider.

## 2013-11-14 NOTE — ED Provider Notes (Signed)
Medical screening examination/treatment/procedure(s) were performed by non-physician practitioner and as supervising physician I was immediately available for consultation/collaboration.   EKG Interpretation None       Doug SouSam Xolani Degracia, MD 11/14/13 [id number]

## 2014-01-17 ENCOUNTER — Emergency Department (HOSPITAL_COMMUNITY)
Admission: EM | Admit: 2014-01-17 | Discharge: 2014-01-17 | Disposition: A | Discharge status: Home / Self Care | Payer: Medicaid Other | Attending: Emergency Medicine | Admitting: Emergency Medicine

## 2014-01-17 ENCOUNTER — Encounter (HOSPITAL_COMMUNITY): Payer: Self-pay | Admitting: Emergency Medicine

## 2014-01-17 DIAGNOSIS — A599 Trichomoniasis, unspecified: Secondary | ICD-10-CM

## 2014-01-17 DIAGNOSIS — L02412 Cutaneous abscess of left axilla: Secondary | ICD-10-CM

## 2014-01-17 DIAGNOSIS — Z8619 Personal history of other infectious and parasitic diseases: Secondary | ICD-10-CM | POA: Insufficient documentation

## 2014-01-17 DIAGNOSIS — A5901 Trichomonal vulvovaginitis: Secondary | ICD-10-CM | POA: Insufficient documentation

## 2014-01-17 DIAGNOSIS — Z3202 Encounter for pregnancy test, result negative: Secondary | ICD-10-CM | POA: Insufficient documentation

## 2014-01-17 DIAGNOSIS — Z79899 Other long term (current) drug therapy: Secondary | ICD-10-CM | POA: Insufficient documentation

## 2014-01-17 DIAGNOSIS — IMO0002 Reserved for concepts with insufficient information to code with codable children: Secondary | ICD-10-CM | POA: Insufficient documentation

## 2014-01-17 LAB — URINALYSIS, ROUTINE W REFLEX MICROSCOPIC
Bilirubin Urine: NEGATIVE
GLUCOSE, UA: NEGATIVE mg/dL
HGB URINE DIPSTICK: NEGATIVE
KETONES UR: NEGATIVE mg/dL
Nitrite: NEGATIVE
Protein, ur: NEGATIVE mg/dL
Specific Gravity, Urine: 1.017 (ref 1.005–1.030)
Urobilinogen, UA: 1 mg/dL (ref 0.0–1.0)
pH: 7 (ref 5.0–8.0)

## 2014-01-17 LAB — URINE MICROSCOPIC-ADD ON

## 2014-01-17 LAB — WET PREP, GENITAL
CLUE CELLS WET PREP: NONE SEEN
YEAST WET PREP: NONE SEEN

## 2014-01-17 LAB — PREGNANCY, URINE: PREG TEST UR: NEGATIVE

## 2014-01-17 LAB — HIV ANTIBODY (ROUTINE TESTING W REFLEX): HIV 1&2 Ab, 4th Generation: NONREACTIVE

## 2014-01-17 MED ORDER — LIDOCAINE HCL (PF) 1 % IJ SOLN
30.0000 mL | Freq: Once | INTRAMUSCULAR | Status: DC
  Filled 2014-01-17: qty 30

## 2014-01-17 MED ORDER — AZITHROMYCIN 250 MG PO TABS
1000.0000 mg | ORAL_TABLET | Freq: Once | ORAL | Status: AC
  Administered 2014-01-17: 1000 mg via ORAL
  Filled 2014-01-17: qty 4

## 2014-01-17 MED ORDER — METRONIDAZOLE 500 MG PO TABS: 500.0000 mg | ORAL_TABLET | Freq: Two times a day (BID) | ORAL | Status: DC

## 2014-01-17 MED ORDER — LIDOCAINE HCL 1 % IJ SOLN: 1.7000 mL | Freq: Once | INTRAMUSCULAR | Status: DC

## 2014-01-17 MED ORDER — CEFTRIAXONE SODIUM 250 MG IJ SOLR
250.0000 mg | Freq: Once | INTRAMUSCULAR | Status: AC
  Administered 2014-01-17: 250 mg via INTRAMUSCULAR
  Filled 2014-01-17: qty 250

## 2014-01-17 MED ORDER — LIDOCAINE HCL (PF) 1 % IJ SOLN
5.0000 mL | Freq: Once | INTRAMUSCULAR | Status: AC
  Administered 2014-01-17: 5 mL via INTRADERMAL
  Filled 2014-01-17: qty 5

## 2014-01-17 MED ORDER — LIDOCAINE HCL 1 % IJ SOLN: 5.0000 mL | Freq: Once | INTRAMUSCULAR | Status: DC

## 2014-01-17 NOTE — ED Notes (Signed)
Pt states last week noted some dysuria. 3 days ago began with vaginal bleeding. Noted last night to have thick yellow vaginal discharge. Pt also states she has a boil under her left arm that has been bothering her for the last 2.5 weeks. Denies fever, vomiting, abdominal pain. Pt awake, alert, oriented x4, NAD at present.

## 2014-01-17 NOTE — ED Notes (Signed)
Meryl Dare., PA and Evette Doffing, NT at bedside performing pelvic exam

## 2014-01-17 NOTE — ED Provider Notes (Signed)
CSN: [id number]     Arrival date & time 01/17/14  1151 History   First MD Initiated Contact with Patient 01/17/14 1304     Chief Complaint  Patient presents with  . Vaginal Discharge  . Recurrent Skin Infections     (Consider location/radiation/quality/duration/timing/severity/associated sxs/prior Treatment) HPI Comments: Patient is a G0 23 yo F presenting to the ED for two complaints. First complaint is 3 day history of vaginal discharge (yellow) that started after some mild spotting four days ago. Patient states she had dysuria associated with the discharge but that has resolved. Patient is also complaining of a 1 week history of an abscess to her left axilla that she noticed 1-2 weeks ago. No drainage from area. Denies any fevers, chills, nausea, vomiting, diarrhea, abdominal pain. No abdominal surgical history.   Patient is a 23 y.o. female presenting with vaginal discharge.  Vaginal Discharge Associated symptoms: dysuria     Past Medical History  Diagnosis Date  . No pertinent past medical history   . Obstruction of colon    Past Surgical History  Procedure Laterality Date  . Anterior cruciate ligament repair    . Anterior cruciate ligament repair     Family History  Problem Relation Age of Onset  . Asthma Sister   . Cancer Maternal Grandmother    History  Substance Use Topics  . Smoking status: Never Smoker   . Smokeless tobacco: Not on file  . Alcohol Use: No   OB History   Grav Para Term Preterm Abortions TAB SAB Ect Mult Living   0 0             Review of Systems  Genitourinary: Positive for dysuria and vaginal discharge.  All other systems reviewed and are negative.     Allergies  Other  Home Medications   Prior to Admission medications   Medication Sig Start Date End Date Taking? Authorizing Provider  etonogestrel (IMPLANON) 68 MG IMPL implant Inject 1 each into the skin once.   Yes Historical Provider, MD  metroNIDAZOLE (FLAGYL) 500 MG tablet  Take 1 tablet (500 mg total) by mouth 2 (two) times daily. 01/17/14   [place] L Nevaen Tredway, PA-C   BP 102/58  Pulse 90  Temp(Src) 98.2 F (36.8 C) (Oral)  Resp 22  SpO2 99% Physical Exam  Nursing note and vitals reviewed. Constitutional: She is oriented to person, place, and time. She appears well-developed and well-nourished. No distress.  HENT:  Head: Normocephalic and atraumatic.  Right Ear: External ear normal.  Left Ear: External ear normal.  Nose: Nose normal.  Mouth/Throat: Oropharynx is clear and moist.  Eyes: Conjunctivae are normal.  Neck: Normal range of motion. Neck supple.  Cardiovascular: Normal rate, regular rhythm and normal heart sounds.   Pulmonary/Chest: Effort normal and breath sounds normal.  Abdominal: Soft. Normal appearance and bowel sounds are normal. There is no tenderness. There is no rigidity, no rebound, no guarding and no CVA tenderness.  Musculoskeletal: Normal range of motion.  Neurological: She is alert and oriented to person, place, and time.  Skin: Skin is warm and dry. She is not diaphoretic.     Psychiatric: She has a normal mood and affect.   Exam performed by Francee Piccolo L,  exam chaperoned Date: 01/17/2014 Pelvic exam: normal external genitalia without evidence of trauma. VULVA: normal appearing vulva with no masses, tenderness or lesion. VAGINA: normal appearing vagina with normal color and discharge, no lesions. CERVIX: normal appearing cervix without lesions, cervical motion  tenderness absent, cervical os closed with out purulent discharge; vaginal discharge - green, Wet prep and DNA probe for chlamydia and GC obtained.   ADNEXA: normal adnexa in size, nontender and no masses UTERUS: uterus is normal size, shape, consistency and nontender.   ED Course  Procedures (including critical care time) Medications  lidocaine (PF) (XYLOCAINE) 1 % injection 30 mL (not administered)  azithromycin (ZITHROMAX) tablet 1,000 mg (not  administered)  cefTRIAXone (ROCEPHIN) injection 250 mg (not administered)    Labs Review Labs Reviewed  WET PREP, GENITAL - Abnormal; Notable for the following:    Trich, Wet Prep MODERATE (*)    WBC, Wet Prep HPF POC MANY (*)    All other components within normal limits  URINALYSIS, ROUTINE W REFLEX MICROSCOPIC - Abnormal; Notable for the following:    APPearance HAZY (*)    Leukocytes, UA LARGE (*)    All other components within normal limits  URINE MICROSCOPIC-ADD ON - Abnormal; Notable for the following:    Squamous Epithelial / LPF FEW (*)    All other components within normal limits  GC/CHLAMYDIA PROBE AMP  PREGNANCY, URINE  HIV ANTIBODY (ROUTINE TESTING)    Imaging Review No results found.   EKG Interpretation None      INCISION AND DRAINAGE Performed by: Francee Piccolo L Consent: Verbal consent obtained. Risks and benefits: risks, benefits and alternatives were discussed Type: abscess  Body area: left axilla  Anesthesia: local infiltration  Incision was made with a scalpel.  Local anesthetic: lidocaine 2% w/ epinephrine  Anesthetic total: 5 ml  Complexity: complex Blunt dissection to break up loculations  Drainage: purulent  Drainage amount: moderate  Packing material: 1/4 in iodoform gauze  Patient tolerance: Patient tolerated the procedure well with no immediate complications.    MDM   Final diagnoses:  Abscess of axilla, left  Trichomonas infection    Filed Vitals:   01/17/14 1519  BP: 102/58  Pulse: 90  Temp:   Resp: 22   Afebrile, NAD, non-toxic appearing, AAOx4.   1) Vaginal discharge: Patient to be discharged with instructions to follow up with OBGYN. Pt understands GC/Chlamydia cultures pending and that they will need to inform all sexual partners within the last 6 months if results return positive. Pt has been treated prophylacticly with azithromycin and rocephin due to pts history, pelvic exam, and wet prep with  increased WBCs. Pt advised that she will receive a call in 48 hours if the test is positive and to refrain from sexual activity for 48 hours. If the test is positive, pt is advised to refrain from sexual activity for 10 days for the medicine to take effect.  Pt not concerning for PID because hemodynamically stable and no cervical motion tenderness on pelvic exam. Pt has also been treated with flagyl for Bacterial Vaginosis. Pt has been advised to not drink alcohol while on this medication.   Discussed that because pt has had recent unprotected sex, might want to consider getting tested for HIV as well. Counseled pt that latex condoms are the only way to prevent against STDs or HIV.   2) Abscess: Patient with skin abscess amenable to incision and drainage.  Abscess was not large enough to warrant packing or drain,  wound recheck in 2 days. Encouraged home warm soaks and flushing.  Mild signs of cellulitis is surrounding skin.  Will d/c to home.  No antibiotic therapy is indicated.   Return precautions discussed. Patient is agreeable to plan. Patient is stable  at time of discharge.    Jeannetta Ellis, PA-C 01/17/14 1611

## 2014-01-17 NOTE — Discharge Instructions (Signed)
Please follow up with your primary care physician in 1-2 days. If you do not have one please call the [facility] number listed above. Please follow up with an Ob/Gyn to schedule a follow up appointment.  Please take your antibiotic until completion. Someone will call you in 48 hours for a positive gonorrhea or chlamydia test result. If positive please refrain from any sexual intercourse for 10 days. Please read all discharge instructions and return precautions.    Abscess An abscess is an infected area that contains a collection of pus and debris.It can occur in almost any part of the body. An abscess is also known as a furuncle or boil. CAUSES  An abscess occurs when tissue gets infected. This can occur from blockage of oil or sweat glands, infection of hair follicles, or a minor injury to the skin. As the body tries to fight the infection, pus collects in the area and creates pressure under the skin. This pressure causes pain. People with weakened immune systems have difficulty fighting infections and get certain abscesses more often.  SYMPTOMS Usually an abscess develops on the skin and becomes a painful mass that is red, warm, and tender. If the abscess forms under the skin, you may feel a moveable soft area under the skin. Some abscesses break open (rupture) on their own, but most will continue to get worse without care. The infection can spread deeper into the body and eventually into the bloodstream, causing you to feel ill.  DIAGNOSIS  Your caregiver will take your medical history and perform a physical exam. A sample of fluid may also be taken from the abscess to determine what is causing your infection. TREATMENT  Your caregiver may prescribe antibiotic medicines to fight the infection. However, taking antibiotics alone usually does not cure an abscess. Your caregiver may need to make a small cut (incision) in the abscess to drain the pus. In some cases, gauze is packed  into the abscess to reduce pain and to continue draining the area. HOME CARE INSTRUCTIONS   Only take over-the-counter or prescription medicines for pain, discomfort, or fever as directed by your caregiver.  If you were prescribed antibiotics, take them as directed. Finish them even if you start to feel better.  If gauze is used, follow your caregiver's directions for changing the gauze.  To avoid spreading the infection:  Keep your draining abscess covered with a bandage.  Wash your hands well.  Do not share personal care items, towels, or whirlpools with others.  Avoid skin contact with others.  Keep your skin and clothes clean around the abscess.  Keep all follow-up appointments as directed by your caregiver. SEEK MEDICAL CARE IF:   You have increased pain, swelling, redness, fluid drainage, or bleeding.  You have muscle aches, chills, or a general ill feeling.  You have a fever. MAKE SURE YOU:   Understand these instructions.  Will watch your condition.  Will get help right away if you are not doing well or get worse. Document Released: 02/03/2005 Document Revised: 10/26/2011 Document Reviewed: 07/09/2011 [facility] Patient Information 2015 [place], [place]. This information is not intended to replace advice given to you by your health care provider. Make sure you discuss any questions you have with your health care provider. Trichomoniasis Trichomoniasis is an infection caused by an organism called Trichomonas. The infection can affect both women and men. In women, the outer female genitalia and the vagina are affected. In men, the penis is mainly  affected, but the prostate and other reproductive organs can also be involved. Trichomoniasis is a sexually transmitted infection (STI) and is most often passed to another person through sexual contact.  RISK FACTORS  Having unprotected sexual intercourse.  Having sexual intercourse with an infected partner. SIGNS AND SYMPTOMS   Symptoms of trichomoniasis in women include:  Abnormal gray-green frothy vaginal discharge.  Itching and irritation of the vagina.  Itching and irritation of the area outside the vagina. Symptoms of trichomoniasis in men include:   Penile discharge with or without pain.  Pain during urination. This results from inflammation of the urethra. DIAGNOSIS  Trichomoniasis may be found during a Pap test or physical exam. Your health care provider may use one of the following methods to help diagnose this infection:  Examining vaginal discharge under a microscope. For men, urethral discharge would be examined.  Testing the pH of the vagina with a test tape.  Using a vaginal swab test that checks for the Trichomonas organism. A test is available that provides results within a few minutes.  Doing a culture test for the organism. This is not usually needed. TREATMENT   You may be given medicine to fight the infection. Women should inform their health care provider if they could be or are pregnant. Some medicines used to treat the infection should not be taken during pregnancy.  Your health care provider may recommend over-the-counter medicines or creams to decrease itching or irritation.  Your sexual partner will need to be treated if infected. HOME CARE INSTRUCTIONS   Take medicines only as directed by your health care provider.  Take over-the-counter medicine for itching or irritation as directed by your health care provider.  Do not have sexual intercourse while you have the infection.  Women should not douche or wear tampons while they have the infection.  Discuss your infection with your partner. Your partner may have gotten the infection from you, or you may have gotten it from your partner.  Have your sex partner get examined and treated if necessary.  Practice safe, informed, and protected sex.  See your health care provider for other STI testing. SEEK MEDICAL CARE IF:    You still have symptoms after you finish your medicine.  You develop abdominal pain.  You have pain when you urinate.  You have bleeding after sexual intercourse.  You develop a rash.  Your medicine makes you sick or makes you throw up (vomit). MAKE SURE YOU:  Understand these instructions.  Will watch your condition.  Will get help right away if you are not doing well or get worse. Document Released: 10/20/2000 Document Revised: 09/10/2013 Document Reviewed: 02/05/2013 [facility] Patient Information 2015 [place], [place]. This information is not intended to replace advice given to you by your health care provider. Make sure you discuss any questions you have with your health care provider.

## 2014-01-18 LAB — GC/CHLAMYDIA PROBE AMP
CT Probe RNA: POSITIVE — AB
GC Probe RNA: NEGATIVE

## 2014-01-19 ENCOUNTER — Telehealth (HOSPITAL_BASED_OUTPATIENT_CLINIC_OR_DEPARTMENT_OTHER): Payer: Self-pay | Admitting: Emergency Medicine

## 2014-01-19 NOTE — Telephone Encounter (Signed)
Positive Chlamydia culture Treated with Zithromax and Rocephin [facility] faxed  Will notify patient  01/19/14 @ 0943 - left voicemail to call flow managers #

## 2014-01-20 ENCOUNTER — Telehealth (HOSPITAL_BASED_OUTPATIENT_CLINIC_OR_DEPARTMENT_OTHER): Payer: Self-pay | Admitting: Emergency Medicine

## 2014-01-20 NOTE — Telephone Encounter (Signed)
ID verified. Patient notified of postiive chlamydia and that treatment was given while in ED with Rocephin and Zithromax. STD instructions provided - patient verbalized understanding.

## 2014-01-23 NOTE — ED Provider Notes (Signed)
Medical screening examination/treatment/procedure(s) were performed by non-physician practitioner and as supervising physician I was immediately available for consultation/collaboration.   EKG Interpretation None       Raeford Razor, MD 01/23/14 1154

## 2014-07-17 ENCOUNTER — Encounter (HOSPITAL_COMMUNITY): Payer: Self-pay | Admitting: *Deleted

## 2014-07-17 ENCOUNTER — Emergency Department (HOSPITAL_COMMUNITY)
Admission: EM | Admit: 2014-07-17 | Discharge: 2014-07-17 | Disposition: A | Discharge status: Home / Self Care | Payer: Medicaid Other | Attending: Emergency Medicine | Admitting: Emergency Medicine

## 2014-07-17 DIAGNOSIS — Z8719 Personal history of other diseases of the digestive system: Secondary | ICD-10-CM | POA: Insufficient documentation

## 2014-07-17 DIAGNOSIS — L739 Follicular disorder, unspecified: Secondary | ICD-10-CM | POA: Insufficient documentation

## 2014-07-17 DIAGNOSIS — N61 Inflammatory disorders of breast: Secondary | ICD-10-CM | POA: Insufficient documentation

## 2014-07-17 DIAGNOSIS — Z792 Long term (current) use of antibiotics: Secondary | ICD-10-CM | POA: Insufficient documentation

## 2014-07-17 MED ORDER — DOXYCYCLINE HYCLATE 100 MG PO CAPS: 100.0000 mg | ORAL_CAPSULE | Freq: Two times a day (BID) | ORAL | Status: DC

## 2014-07-17 NOTE — ED Notes (Signed)
Pt reports boils under both arms x 1 month. No redness or swelling noted.

## 2014-07-17 NOTE — Discharge Instructions (Signed)
Warm compresses to the area with infection several times a day. Take antibiotics as prescribed. Follow up with your doctor for recheck. Return if worsening,.    Folliculitis  Folliculitis is redness, soreness, and swelling (inflammation) of the hair follicles. This condition can occur anywhere on the body. People with weakened immune systems, diabetes, or obesity have a greater risk of getting folliculitis. CAUSES  Bacterial infection. This is the most common cause.  Fungal infection.  Viral infection.  Contact with certain chemicals, especially oils and tars. Long-term folliculitis can result from bacteria that live in the nostrils. The bacteria may trigger multiple outbreaks of folliculitis over time. SYMPTOMS Folliculitis most commonly occurs on the scalp, thighs, legs, back, buttocks, and areas where hair is shaved frequently. An early sign of folliculitis is a small, white or yellow, pus-filled, itchy lesion (pustule). These lesions appear on a red, inflamed follicle. They are usually less than 0.2 inches (5 mm) wide. When there is an infection of the follicle that goes deeper, it becomes a boil or furuncle. A group of closely packed boils creates a larger lesion (carbuncle). Carbuncles tend to occur in hairy, sweaty areas of the body. DIAGNOSIS  Your caregiver can usually tell what is wrong by doing a physical exam. A sample may be taken from one of the lesions and tested in a lab. This can help determine what is causing your folliculitis. TREATMENT  Treatment may include:  Applying warm compresses to the affected areas.  Taking antibiotic medicines orally or applying them to the skin.  Draining the lesions if they contain a large amount of pus or fluid.  Laser hair removal for cases of long-lasting folliculitis. This helps to prevent regrowth of the hair. HOME CARE INSTRUCTIONS  Apply warm compresses to the affected areas as directed by your caregiver.  If antibiotics are  prescribed, take them as directed. Finish them even if you start to feel better.  You may take over-the-counter medicines to relieve itching.  Do not shave irritated skin.  Follow up with your caregiver as directed. SEEK IMMEDIATE MEDICAL CARE IF:   You have increasing redness, swelling, or pain in the affected area.  You have a fever. MAKE SURE YOU:  Understand these instructions.  Will watch your condition.  Will get help right away if you are not doing well or get worse. Document Released: 07/05/2001 Document Revised: 10/26/2011 Document Reviewed: 07/27/2011 [facility] Patient Information 2015 [place], [place]. This information is not intended to replace advice given to you by your health care provider. Make sure you discuss any questions you have with your health care provider.

## 2014-07-17 NOTE — ED Provider Notes (Signed)
CSN: [id number]     Arrival date & time 07/17/14  1046 History   First MD Initiated Contact with Patient 07/17/14 1107     Chief Complaint  Patient presents with  . Abscess     (Consider location/radiation/quality/duration/timing/severity/associated sxs/prior Treatment) HPI  April Crane is a 24 y.o. female with no medical problems presents to emergency department with multiple abscesses. States she has had multiple small abscesses to the left axilla, right axilla, breast for several months. States one of the abscesses to the left axilla started draining a few days ago. States abscess to the breast started to drain yesterday and is tender and not as what made her come in today. She has not tried any medications for this. She reports having incision and drainage done in the past for these. She denies fever, chills, malaise. Nothing making her symptoms better or worse.  Past Medical History  Diagnosis Date  . No pertinent past medical history   . Obstruction of colon    Past Surgical History  Procedure Laterality Date  . Anterior cruciate ligament repair    . Anterior cruciate ligament repair     Family History  Problem Relation Age of Onset  . Asthma Sister   . Cancer Maternal Grandmother    History  Substance Use Topics  . Smoking status: Never Smoker   . Smokeless tobacco: Not on file  . Alcohol Use: No   OB History    Gravida Para Term Preterm AB TAB SAB Ectopic Multiple Living   0 0             Review of Systems  Constitutional: Negative for fever and chills.  Respiratory: Negative for cough, chest tightness and shortness of breath.   Cardiovascular: Negative for chest pain, palpitations and leg swelling.  Musculoskeletal: Negative for myalgias, arthralgias, neck pain and neck stiffness.  Skin: Positive for wound. Negative for rash.  All other systems reviewed and are negative.     Allergies  Other  Home Medications   Prior to Admission medications    Medication Sig Start Date End Date Taking? Authorizing Provider  doxycycline (VIBRAMYCIN) 100 MG capsule Take 1 capsule (100 mg total) by mouth 2 (two) times daily. 07/17/14   Thanos Cousineau, PA-C  etonogestrel (IMPLANON) 68 MG IMPL implant Inject 1 each into the skin once.    Historical Provider, MD  metroNIDAZOLE (FLAGYL) 500 MG tablet Take 1 tablet (500 mg total) by mouth 2 (two) times daily. 01/17/14   Jennifer Piepenbrink, PA-C   BP 110/70 mmHg  Pulse 79  Temp(Src) 98.3 F (36.8 C) (Oral)  Resp 20  SpO2 100% Physical Exam  Constitutional: She appears well-developed and well-nourished. No distress.  Eyes: Conjunctivae are normal.  Neck: Neck supple.  Neurological: She is alert.  Skin: Skin is warm and dry.  A few small pustules to the left axilla, one is draining. No induration, fluctuance, erythema to the axilla. Area is nontender. Right axilla is normal-appearing. One small pustule to the right breast, nontender, measuring less than 1 cm. No surrounding erythema.  Nursing note and vitals reviewed.   ED Course  Procedures (including critical care time) Labs Review Labs Reviewed - No data to display  Imaging Review No results found.   EKG Interpretation None      MDM   Final diagnoses:  Chronic folliculitis    Patient with few small pustules to the left axilla and right breast. None of the areas required incision and drainage. She  is afebrile, nontoxic appearing. Will start on doxycycline for antibiotics, follow with primary care doctor.   Filed Vitals:   07/17/14 1053  BP: 110/70  Pulse: 79  Temp: 98.3 F (36.8 C)  TempSrc: Oral  Resp: 20  SpO2: 100%      Jaynie Crumbleatyana Trevyon Swor, PA-C 07/17/14 1258  Derwood KaplanAnkit Nanavati, MD 07/19/14 [phone]

## 2014-12-17 ENCOUNTER — Emergency Department (HOSPITAL_COMMUNITY)
Admission: EM | Admit: 2014-12-17 | Discharge: 2014-12-17 | Disposition: A | Discharge status: Home / Self Care | Payer: No Typology Code available for payment source | Attending: Emergency Medicine | Admitting: Emergency Medicine

## 2014-12-17 ENCOUNTER — Encounter (HOSPITAL_COMMUNITY): Payer: Self-pay | Admitting: Physical Medicine and Rehabilitation

## 2014-12-17 ENCOUNTER — Emergency Department (HOSPITAL_COMMUNITY): Payer: No Typology Code available for payment source

## 2014-12-17 DIAGNOSIS — Y9389 Activity, other specified: Secondary | ICD-10-CM | POA: Insufficient documentation

## 2014-12-17 DIAGNOSIS — S0990XA Unspecified injury of head, initial encounter: Secondary | ICD-10-CM | POA: Diagnosis not present

## 2014-12-17 DIAGNOSIS — M25572 Pain in left ankle and joints of left foot: Secondary | ICD-10-CM

## 2014-12-17 DIAGNOSIS — S8002XA Contusion of left knee, initial encounter: Secondary | ICD-10-CM | POA: Diagnosis not present

## 2014-12-17 DIAGNOSIS — S99912A Unspecified injury of left ankle, initial encounter: Secondary | ICD-10-CM | POA: Insufficient documentation

## 2014-12-17 DIAGNOSIS — Y92410 Unspecified street and highway as the place of occurrence of the external cause: Secondary | ICD-10-CM | POA: Diagnosis not present

## 2014-12-17 DIAGNOSIS — Y998 Other external cause status: Secondary | ICD-10-CM | POA: Diagnosis not present

## 2014-12-17 DIAGNOSIS — Z8719 Personal history of other diseases of the digestive system: Secondary | ICD-10-CM | POA: Insufficient documentation

## 2014-12-17 DIAGNOSIS — S8992XA Unspecified injury of left lower leg, initial encounter: Secondary | ICD-10-CM | POA: Diagnosis present

## 2014-12-17 DIAGNOSIS — Z79899 Other long term (current) drug therapy: Secondary | ICD-10-CM | POA: Insufficient documentation

## 2014-12-17 MED ORDER — HYDROCODONE-ACETAMINOPHEN 5-325 MG PO TABS
1.0000 | ORAL_TABLET | Freq: Once | ORAL | Status: AC
  Administered 2014-12-17: 1 via ORAL
  Filled 2014-12-17: qty 1

## 2014-12-17 MED ORDER — DICLOFENAC SODIUM 50 MG PO TBEC: 50.0000 mg | DELAYED_RELEASE_TABLET | Freq: Two times a day (BID) | ORAL | Status: DC
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
## 2014-12-18 ENCOUNTER — Telehealth: Payer: Self-pay | Admitting: *Deleted

## 2014-12-18 NOTE — Telephone Encounter (Signed)
Pt sister called stating pt did not receive Rx and it had not been called in to pharmacy.  NCM had pt sister flip through papers to find paper with Rx on back.  Pt sister located it and will take to pharmacy to have filled promptly. Suly Vukelich J. Lucretia Roers, RN, BSN, [employer] [phone]

## 2014-12-21 ENCOUNTER — Encounter (HOSPITAL_COMMUNITY): Payer: Self-pay | Admitting: Emergency Medicine

## 2014-12-21 DIAGNOSIS — Z79899 Other long term (current) drug therapy: Secondary | ICD-10-CM | POA: Diagnosis not present

## 2014-12-21 DIAGNOSIS — N390 Urinary tract infection, site not specified: Secondary | ICD-10-CM | POA: Insufficient documentation

## 2014-12-21 DIAGNOSIS — Z792 Long term (current) use of antibiotics: Secondary | ICD-10-CM | POA: Insufficient documentation

## 2014-12-21 DIAGNOSIS — Z8719 Personal history of other diseases of the digestive system: Secondary | ICD-10-CM | POA: Diagnosis not present

## 2014-12-21 DIAGNOSIS — Z3202 Encounter for pregnancy test, result negative: Secondary | ICD-10-CM | POA: Insufficient documentation

## 2014-12-21 DIAGNOSIS — R300 Dysuria: Secondary | ICD-10-CM | POA: Diagnosis present

## 2014-12-21 LAB — I-STAT BETA HCG BLOOD, ED (MC, WL, AP ONLY): I-stat hCG, quantitative: <5 m[IU]/mL (ref <5)

## 2014-12-21 LAB — URINE MICROSCOPIC-ADD ON

## 2014-12-21 LAB — URINALYSIS, ROUTINE W REFLEX MICROSCOPIC
Bilirubin Urine: NEGATIVE
Glucose, UA: NEGATIVE mg/dL
Ketones, ur: NEGATIVE mg/dL
Nitrite: NEGATIVE
PH: 6.5 (ref 5.0–8.0)
PROTEIN: 100 mg/dL — AB
Specific Gravity, Urine: 1.02 (ref 1.005–1.030)
Urobilinogen, UA: 1 mg/dL (ref 0.0–1.0)
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
## 2014-12-22 ENCOUNTER — Emergency Department (HOSPITAL_COMMUNITY)
Admission: EM | Admit: 2014-12-22 | Discharge: 2014-12-22 | Disposition: A | Discharge status: Home / Self Care | Payer: No Typology Code available for payment source | Attending: Emergency Medicine | Admitting: Emergency Medicine

## 2014-12-22 DIAGNOSIS — N390 Urinary tract infection, site not specified: Secondary | ICD-10-CM

## 2014-12-22 DIAGNOSIS — R319 Hematuria, unspecified: Secondary | ICD-10-CM

## 2014-12-22 MED ORDER — CEPHALEXIN 500 MG PO CAPS: 500.0000 mg | ORAL_CAPSULE | Freq: Two times a day (BID) | ORAL | Status: DC

## 2014-12-22 MED ORDER — IBUPROFEN 800 MG PO TABS: 800.0000 mg | ORAL_TABLET | Freq: Three times a day (TID) | ORAL | Status: DC | PRN

## 2014-12-22 MED ORDER — CEPHALEXIN 250 MG PO CAPS
500.0000 mg | ORAL_CAPSULE | Freq: Once | ORAL | Status: AC
  Administered 2014-12-22: 500 mg via ORAL
  Filled 2014-12-22: qty 2

## 2014-12-22 MED ORDER — IBUPROFEN 800 MG PO TABS
800.0000 mg | ORAL_TABLET | Freq: Once | ORAL | Status: AC
  Administered 2014-12-22: 800 mg via ORAL
  Filled 2014-12-22: qty 1
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
## 2014-12-22 NOTE — Discharge Instructions (Signed)

## 2014-12-24 LAB — URINE CULTURE: Culture: >=100000

## 2014-12-25 ENCOUNTER — Telehealth (HOSPITAL_COMMUNITY): Payer: Self-pay

## 2014-12-25 NOTE — Telephone Encounter (Signed)
Post ED Visit - Positive Culture Follow-up  Culture report reviewed by antimicrobial stewardship pharmacist:  Wes Dulaney, Pharm.D., BCPS  Celedonio Miyamoto, Pharm.D., BCPS  Georgina Pillion, Pharm.D., BCPS  [place], [address].D., BCPS, AAHIVP  Estella Husk, Pharm.D., BCPS, AAHIVP  [place], [address].D., BCPS X  T. [employer]. D.   Positive Urine culture, >/+ 100,000 colonies -> E Coli Treated with Cephalexin, organism sensitive to the same and no further patient follow-up is required at this time.  Arvid Right 12/25/2014, 4:56 PM

## 2015-02-02 ENCOUNTER — Encounter (HOSPITAL_COMMUNITY): Payer: Self-pay | Admitting: Emergency Medicine

## 2015-02-02 ENCOUNTER — Emergency Department (HOSPITAL_COMMUNITY)
Admission: EM | Admit: 2015-02-02 | Discharge: 2015-02-02 | Disposition: A | Discharge status: Home / Self Care | Payer: Self-pay | Attending: Emergency Medicine | Admitting: Emergency Medicine

## 2015-02-02 DIAGNOSIS — L03011 Cellulitis of right finger: Secondary | ICD-10-CM

## 2015-02-02 DIAGNOSIS — L03031 Cellulitis of right toe: Secondary | ICD-10-CM | POA: Insufficient documentation

## 2015-02-02 DIAGNOSIS — Z792 Long term (current) use of antibiotics: Secondary | ICD-10-CM | POA: Insufficient documentation

## 2015-02-02 DIAGNOSIS — Z23 Encounter for immunization: Secondary | ICD-10-CM | POA: Insufficient documentation

## 2015-02-02 MED ORDER — TETANUS-DIPHTH-ACELL PERTUSSIS 5-2.5-18.5 LF-MCG/0.5 IM SUSP
0.5000 mL | Freq: Once | INTRAMUSCULAR | Status: AC
  Administered 2015-02-02: 0.5 mL via INTRAMUSCULAR
  Filled 2015-02-02: qty 0.5

## 2015-02-02 MED ORDER — CEPHALEXIN 500 MG PO CAPS: 500.0000 mg | ORAL_CAPSULE | Freq: Four times a day (QID) | ORAL | Status: DC

## 2015-02-02 NOTE — ED Notes (Signed)
Pt reports toe pain x 1 week with green discharge, big toe of R foot.  NAD noted at this time

## 2015-02-02 NOTE — Discharge Instructions (Signed)
Follow up as needed for continued or worsening symptoms Paronychia  Paronychia is an infection of the skin caused by germs. It happens by the fingernail or toenail. You can avoid it by not:  Pulling on hangnails.  Nail biting.  Thumb sucking.  Cutting fingernails and toenails too short.  Cutting the skin at the base and sides of the fingernail or toenail (cuticle). HOME CARE  Keep the fingers or toes very dry. Put rubber gloves over cotton gloves when putting hands in water.  Keep the wound clean and bandaged (dressed) as told by your doctor.  Soak the fingers or toes in warm water for 15 to 20 minutes. Soak them 3 to 4 times per day for germ infections. Fungal infections are difficult to treat. Fungal infections often require treatment for a long time.  Only take medicine as told by your doctor. GET HELP RIGHT AWAY IF:   You have redness, puffiness (swelling), or pain that gets worse.  You see yellowish-white fluid (pus) coming from the wound.  You have a fever.  You have a bad smell coming from the wound or bandage. MAKE SURE YOU:  Understand these instructions.  Will watch your condition.  Will get help if you are not doing well or get worse. Document Released: 04/14/2009 Document Revised: 07/19/2011 Document Reviewed: 04/14/2009 [facility] Patient Information 2015 [place], [place]. This information is not intended to replace advice given to you by your health care provider. Make sure you discuss any questions you have with your health care provider.

## 2015-02-02 NOTE — ED Provider Notes (Signed)
CSN: [id number]     Arrival date & time 02/02/15  2052 History  This chart was scribed for non-physician provider Teressa Lower, NP-C, working with Leta Baptist, MD by Phillis Haggis, ED Scribe. This patient was seen in room TR10C/TR10C and patient care was started at 9:06 PM.    Chief Complaint  Patient presents with  . Toe Pain   The history is provided by the patient. No language interpreter was used.   HPI Comments: April Crane is a 24 y.o. female who presents to the Emergency Department complaining of a right great toe pain onset one week ago. Pt states that she thinks she hit the lateral aspect of the toe and began to notice pus in the crease between her nail and skin today. She denies hx of DM or hx of similar symptoms.   Past Medical History  Diagnosis Date  . No pertinent past medical history   . Obstruction of colon    Past Surgical History  Procedure Laterality Date  . Anterior cruciate ligament repair    . Anterior cruciate ligament repair     Family History  Problem Relation Age of Onset  . Asthma Sister   . Cancer Maternal Grandmother    Social History  Substance Use Topics  . Smoking status: Never Smoker   . Smokeless tobacco: Not on file  . Alcohol Use: No   OB History    Gravida Para Term Preterm AB TAB SAB Ectopic Multiple Living   0 0             Review of Systems  Musculoskeletal: Positive for arthralgias.  All other systems reviewed and are negative.  Allergies  Other  Home Medications   Prior to Admission medications   Medication Sig Start Date End Date Taking? Authorizing Provider  cephALEXin (KEFLEX) 500 MG capsule Take 1 capsule (500 mg total) by mouth 2 (two) times daily. 12/22/14   Kristen N Ward, DO  diclofenac (VOLTAREN) 50 MG EC tablet Take 1 tablet (50 mg total) by mouth 2 (two) times daily. 12/17/14   Hope Orlene Och, NP  doxycycline (VIBRAMYCIN) 100 MG capsule Take 1 capsule (100 mg total) by mouth 2 (two) times daily. 07/17/14    Tatyana Kirichenko, PA-C  etonogestrel (IMPLANON) 68 MG IMPL implant Inject 1 each into the skin once.    Historical Provider, MD  ibuprofen (ADVIL,MOTRIN) 800 MG tablet Take 1 tablet (800 mg total) by mouth every 8 (eight) hours as needed for mild pain. 12/22/14   Kristen N Ward, DO  metroNIDAZOLE (FLAGYL) 500 MG tablet Take 1 tablet (500 mg total) by mouth 2 (two) times daily. 01/17/14   Jennifer Piepenbrink, PA-C   There were no vitals taken for this visit. Physical Exam  Constitutional: She is oriented to person, place, and time. She appears well-developed and well-nourished.  HENT:  Head: Normocephalic and atraumatic.  Eyes: EOM are normal.  Neck: Normal range of motion. Neck supple.  Cardiovascular: Normal rate.   Pulmonary/Chest: Effort normal.  Musculoskeletal: Normal range of motion.  Neurological: She is alert and oriented to person, place, and time.  Skin: Skin is warm and dry.  Green fluctuant area noted on the medial aspect of the right great toe  Psychiatric: She has a normal mood and affect. Her behavior is normal.  Nursing note and vitals reviewed.   ED Course  Drain paronychia Date/Time: 02/02/2015 9:17 PM Performed by: Teressa Lower Authorized by: Teressa Lower Consent: Verbal consent obtained. Consent  given by: patient Patient identity confirmed: verbally with patient Local anesthesia used: no Patient sedated: no Patient tolerance: Patient tolerated the procedure well with no immediate complications   (including critical care time) DIAGNOSTIC STUDIES:   COORDINATION OF CARE: 9:12 PM-Discussed treatment plan which includes drainage of pus and anti-biotics with pt at bedside and pt agreed to plan.   Labs Review Labs Reviewed - No data to display  Imaging Review No results found.   EKG Interpretation None      MDM   Final diagnoses:  Paronychia, right    Area drained. Tetanus updated. Will treat with keflex.  I personally performed the  services described in this documentation, which was scribed in my presence. The recorded information has been reviewed and is accurate.    Teressa Lower, NP 02/02/15 2118  Leta Baptist, MD 02/08/15 [phone]

## 2015-02-23 ENCOUNTER — Encounter (HOSPITAL_COMMUNITY): Payer: Self-pay | Admitting: Emergency Medicine

## 2015-02-23 ENCOUNTER — Emergency Department (HOSPITAL_COMMUNITY)
Admission: EM | Admit: 2015-02-23 | Discharge: 2015-02-23 | Disposition: A | Discharge status: Home / Self Care | Payer: No Typology Code available for payment source | Attending: Emergency Medicine | Admitting: Emergency Medicine

## 2015-02-23 DIAGNOSIS — Z8719 Personal history of other diseases of the digestive system: Secondary | ICD-10-CM | POA: Diagnosis not present

## 2015-02-23 DIAGNOSIS — H109 Unspecified conjunctivitis: Secondary | ICD-10-CM | POA: Insufficient documentation

## 2015-02-23 DIAGNOSIS — H578 Other specified disorders of eye and adnexa: Secondary | ICD-10-CM | POA: Diagnosis present

## 2015-02-23 MED ORDER — POLYMYXIN B-TRIMETHOPRIM 10000-0.1 UNIT/ML-% OP SOLN: 1.0000 [drp] | OPHTHALMIC | Status: DC

## 2015-02-23 NOTE — ED Provider Notes (Signed)
CSN: [id number]     Arrival date & time 02/23/15  8295 History   First MD Initiated Contact with Patient 02/23/15 [phone]     Chief Complaint  Patient presents with  . Conjunctivitis     (Consider location/radiation/quality/duration/timing/severity/associated sxs/prior Treatment) HPI   Pt presents with bilateral eye irritation, redness, and discharge.  Pt noted right eye irritation last night, when she woke up this morning both of her eyes felt irritated, were red, slightly swollen.  The right eye was crusted shut with yellow/green discharge.  No change in vision or photophobia.  She wears glasses, never wears contacts.  Denies any trauma, injury, or possibility of foreign body to the eye.  She has some allergies but the eyes are not itching and she has had no sneezing.  Denies any URI symptoms.  She has been to a [employer] gathering putting on makeup involving the eyes 3 days ago and works in two different daycares.  Unsure of sick contacts.    Past Medical History  Diagnosis Date  . No pertinent past medical history   . Obstruction of colon [facility])    Past Surgical History  Procedure Laterality Date  . Anterior cruciate ligament repair    . Anterior cruciate ligament repair     Family History  Problem Relation Age of Onset  . Asthma Sister   . Cancer Maternal Grandmother    Social History  Substance Use Topics  . Smoking status: Never Smoker   . Smokeless tobacco: None  . Alcohol Use: No   OB History    Gravida Para Term Preterm AB TAB SAB Ectopic Multiple Living   0 0             Review of Systems  Constitutional: Negative for fever and chills.  HENT: Negative for congestion, dental problem, ear pain, mouth sores, rhinorrhea, sore throat and trouble swallowing.   Eyes: Positive for discharge and redness. Negative for photophobia, itching and visual disturbance.  Respiratory: Negative for cough, shortness of breath, wheezing and stridor.   Cardiovascular: Negative for chest  pain.  Musculoskeletal: Negative for neck pain and neck stiffness.  Skin: Negative for rash.  Allergic/Immunologic: Negative for immunocompromised state.  Hematological: Does not bruise/bleed easily.  Psychiatric/Behavioral: Negative for self-injury.      Allergies  Other  Home Medications   Prior to Admission medications   Medication Sig Start Date End Date Taking? Authorizing Provider  etonogestrel (IMPLANON) 68 MG IMPL implant Inject 1 each into the skin once.   Yes Historical Provider, MD  ibuprofen (ADVIL,MOTRIN) 800 MG tablet Take 1 tablet (800 mg total) by mouth every 8 (eight) hours as needed for mild pain. 12/22/14  Yes Kristen N Ward, DO  cephALEXin (KEFLEX) 500 MG capsule Take 1 capsule (500 mg total) by mouth 4 (four) times daily. Patient not taking: Reported on 02/23/2015 02/02/15   Teressa Lower, NP  diclofenac (VOLTAREN) 50 MG EC tablet Take 1 tablet (50 mg total) by mouth 2 (two) times daily. Patient not taking: Reported on 02/23/2015 12/17/14   [facility] Orlene Och, NP  doxycycline (VIBRAMYCIN) 100 MG capsule Take 1 capsule (100 mg total) by mouth 2 (two) times daily. Patient not taking: Reported on 02/23/2015 07/17/14   Tatyana Kirichenko, PA-C  metroNIDAZOLE (FLAGYL) 500 MG tablet Take 1 tablet (500 mg total) by mouth 2 (two) times daily. Patient not taking: Reported on 02/23/2015 01/17/14   Victorino Dike Piepenbrink, PA-C   BP 108/72 mmHg  Pulse 77  Temp(Src) 98.2  F (36.8 C) (Oral)  Resp 16  SpO2 100% Physical Exam  Constitutional: She appears well-developed and well-nourished. No distress.  HENT:  Head: Normocephalic and atraumatic.  Eyes: EOM are normal. Pupils are equal, round, and reactive to light. Right eye exhibits discharge. Right eye exhibits no chemosis, no exudate and no hordeolum. No foreign body present in the right eye. Left eye exhibits discharge. Left eye exhibits no chemosis, no exudate and no hordeolum. No foreign body present in the left eye. Right  conjunctiva is injected. Right conjunctiva has no hemorrhage. Left conjunctiva is injected. Left conjunctiva has no hemorrhage. No scleral icterus.  Bilateral eyes mildly injected with watery discharge.  EOMs intact.  No FB noted.  No periorbital or lid edema or erythema.    Neck: Neck supple.  Pulmonary/Chest: Effort normal.  Neurological: She is alert.  Skin: She is not diaphoretic.  Nursing note and vitals reviewed.   ED Course  Procedures (including critical care time) Labs Review Labs Reviewed - No data to display  Imaging Review No results found. I have personally reviewed and evaluated these images and lab results as part of my medical decision-making.   EKG Interpretation None      MDM   Final diagnoses:  Bilateral conjunctivitis    Afebrile, nontoxic patient with bilateral eye irritation and discharge.  Does not wear contacts.  No trauma or injury.  No concern for FB or other exposure.  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  Doubt corneal abrasion, corneal ulcer, foreign body, glaucoma, iritis.   D/C home with polytrim, PCP follow up.  Discussed result, findings, treatment, and follow up  with patient.  Pt given return precautions.  Pt verbalizes understanding and agrees with plan.         Trixie Dredgemily Yarelin Reichardt, PA-C 02/23/15 1038  Geoffery Lyonsouglas Delo, MD 02/23/15 [phone]

## 2015-02-23 NOTE — Discharge Instructions (Signed)
Read the information below.  Use the prescribed medication as directed.  Please discuss all new medications with your pharmacist.  You may return to the Emergency Department at any time for worsening condition or any new symptoms that concern you.      If you develop worsening pain in your eye, change in your vision, swelling around your eye, difficulty moving your eye, or fevers greater than 100.4, see your eye doctor or return to the Emergency Department immediately for a recheck.    °

## 2015-02-23 NOTE — ED Notes (Signed)
Last night right eye felt like it had something in it, so kept rubbing it. This morning when she woke up both were swollen, and right eye was crusted closed. Had to wash crust off to open eye. Conjuntiva red both eyes.

## 2015-07-05 ENCOUNTER — Encounter (HOSPITAL_COMMUNITY): Payer: Self-pay | Admitting: Emergency Medicine

## 2015-07-05 ENCOUNTER — Emergency Department (HOSPITAL_COMMUNITY)
Admission: EM | Admit: 2015-07-05 | Discharge: 2015-07-05 | Disposition: A | Discharge status: Home / Self Care | Payer: BLUE CROSS/BLUE SHIELD | Attending: Emergency Medicine | Admitting: Emergency Medicine

## 2015-07-05 DIAGNOSIS — R51 Headache: Secondary | ICD-10-CM | POA: Insufficient documentation

## 2015-07-05 DIAGNOSIS — Z8719 Personal history of other diseases of the digestive system: Secondary | ICD-10-CM | POA: Insufficient documentation

## 2015-07-05 DIAGNOSIS — J029 Acute pharyngitis, unspecified: Secondary | ICD-10-CM

## 2015-07-05 LAB — RAPID STREP SCREEN (MED CTR MEBANE ONLY): STREPTOCOCCUS, GROUP A SCREEN (DIRECT): NEGATIVE

## 2015-07-05 MED ORDER — IBUPROFEN 800 MG PO TABS: 800.0000 mg | ORAL_TABLET | Freq: Three times a day (TID) | ORAL | Status: DC

## 2015-07-05 MED ORDER — LIDOCAINE VISCOUS 2 % MT SOLN
15.0000 mL | Freq: Once | OROMUCOSAL | Status: AC
Start: 2015-07-05 — End: 2015-07-05
  Administered 2015-07-05: 15 mL via OROMUCOSAL
  Filled 2015-07-05: qty 15

## 2015-07-05 MED ORDER — DEXAMETHASONE SODIUM PHOSPHATE 10 MG/ML IJ SOLN
10.0000 mg | Freq: Once | INTRAMUSCULAR | Status: AC
  Administered 2015-07-05: 10 mg via INTRAMUSCULAR
  Filled 2015-07-05: qty 1

## 2015-07-05 MED ORDER — IBUPROFEN 400 MG PO TABS
800.0000 mg | ORAL_TABLET | Freq: Once | ORAL | Status: AC
  Administered 2015-07-05: 800 mg via ORAL
  Filled 2015-07-05: qty 2

## 2015-07-05 MED ORDER — LIDOCAINE VISCOUS 2 % MT SOLN: 20.0000 mL | OROMUCOSAL | Status: DC | PRN

## 2015-07-05 NOTE — ED Notes (Signed)
Declined W/C at D/C and was escorted to lobby by RN. 

## 2015-07-05 NOTE — ED Provider Notes (Signed)
CSN: [id number]     Arrival date & time 07/05/15  0809 History   First MD Initiated Contact with Patient 07/05/15 0825     Chief Complaint  Patient presents with  . Sore Throat     (Consider location/radiation/quality/duration/timing/severity/associated sxs/prior Treatment) Patient is a 25 y.o. female presenting with pharyngitis. The history is provided by the patient.  Sore Throat This is a new problem. The current episode started yesterday. The problem occurs constantly. The problem has been gradually worsening. Associated symptoms include coughing (dry, nonproductive), headaches and a sore throat. Pertinent negatives include no abdominal pain, chills, congestion, fever, myalgias, nausea, neck pain, swollen glands or vomiting. The symptoms are aggravated by drinking and eating. She has tried nothing for the symptoms. The treatment provided no relief.    Past Medical History  Diagnosis Date  . No pertinent past medical history   . Obstruction of colon [facility])    Past Surgical History  Procedure Laterality Date  . Anterior cruciate ligament repair    . Anterior cruciate ligament repair     Family History  Problem Relation Age of Onset  . Asthma Sister   . Cancer Maternal Grandmother    Social History  Substance Use Topics  . Smoking status: Never Smoker   . Smokeless tobacco: None  . Alcohol Use: No   OB History    Gravida Para Term Preterm AB TAB SAB Ectopic Multiple Living   0 0             Review of Systems  Constitutional: Negative for fever and chills.  HENT: Positive for sore throat. Negative for congestion, ear pain and rhinorrhea.   Respiratory: Positive for cough (dry, nonproductive).   Gastrointestinal: Negative for nausea, vomiting and abdominal pain.  Musculoskeletal: Negative for myalgias and neck pain.  Neurological: Positive for headaches.  All other systems reviewed and are negative.     Allergies  Other  Home Medications   Prior to Admission  medications   Medication Sig Start Date End Date Taking? Authorizing Provider  cephALEXin (KEFLEX) 500 MG capsule Take 1 capsule (500 mg total) by mouth 4 (four) times daily. Patient not taking: Reported on 02/23/2015 02/02/15   Teressa Lower, NP  diclofenac (VOLTAREN) 50 MG EC tablet Take 1 tablet (50 mg total) by mouth 2 (two) times daily. Patient not taking: Reported on 02/23/2015 12/17/14   [facility] Orlene Och, NP  doxycycline (VIBRAMYCIN) 100 MG capsule Take 1 capsule (100 mg total) by mouth 2 (two) times daily. Patient not taking: Reported on 02/23/2015 07/17/14   Jaynie Crumble, PA-C  etonogestrel (IMPLANON) 68 MG IMPL implant Inject 1 each into the skin once.    Historical Provider, MD  ibuprofen (ADVIL,MOTRIN) 800 MG tablet Take 1 tablet (800 mg total) by mouth 3 (three) times daily. 07/05/15   Cheri Fowler, PA-C  lidocaine (XYLOCAINE) 2 % solution Use as directed 20 mLs in the mouth or throat as needed for mouth pain. 07/05/15   Cheri Fowler, PA-C  metroNIDAZOLE (FLAGYL) 500 MG tablet Take 1 tablet (500 mg total) by mouth 2 (two) times daily. Patient not taking: Reported on 02/23/2015 01/17/14   Francee Piccolo, PA-C  trimethoprim-polymyxin b (POLYTRIM) ophthalmic solution Place 1 drop into both eyes every 4 (four) hours. X 7 days 02/23/15   Trixie Dredge, PA-C   BP 116/79 mmHg  Pulse 100  Temp(Src) 98.5 F (36.9 C) (Oral)  Resp 18  Ht 5' 0.5" (1.537 m)  Wt 82.3 kg  BMI 34.84  kg/m2  SpO2 96% Physical Exam  Constitutional: She is oriented to person, place, and time. She appears well-developed and well-nourished.  Non-toxic appearance. She does not have a sickly appearance. She does not appear ill.  HENT:  Head: Normocephalic and atraumatic.  Mouth/Throat: Uvula is midline and mucous membranes are normal. No trismus in the jaw. Posterior oropharyngeal edema and posterior oropharyngeal erythema present. No oropharyngeal exudate or tonsillar abscesses.  Eyes: Conjunctivae are normal. Pupils  are equal, round, and reactive to light.  Neck: Normal range of motion. Neck supple.  Cardiovascular: Normal rate, regular rhythm and normal heart sounds.   No murmur heard. Pulmonary/Chest: Effort normal and breath sounds normal. No accessory muscle usage or stridor. No respiratory distress. She has no wheezes. She has no rhonchi. She has no rales.  Abdominal: Soft. Bowel sounds are normal. She exhibits no distension. There is no tenderness.  Musculoskeletal: Normal range of motion.  Lymphadenopathy:    She has no cervical adenopathy.  Neurological: She is alert and oriented to person, place, and time.  Speech clear without dysarthria.  Skin: Skin is warm and dry.  Psychiatric: She has a normal mood and affect. Her behavior is normal.    ED Course  Procedures (including critical care time) Labs Review Labs Reviewed  RAPID STREP SCREEN (NOT AT [facility])  CULTURE, GROUP A STREP [facility])    Imaging Review No results found. I have personally reviewed and evaluated these images and lab results as part of my medical decision-making.   EKG Interpretation None      MDM   Final diagnoses:  Pharyngitis    Patient presents with sxs consistent with pharyngitis.  VSS, NAD. Post oropharyngeal edema and erythema, no exudates.  Uvula midline.  Remaining HENT exam unremarkable.  Rapid strep negative.  Patient given Decadron, viscous lidocaine, and ibuprofen in ED.  Plan to discharge home with symptomatic treatment. Discussed return precautions.  Patient agrees and acknowledges the above plan for discharge.    Cheri Fowler, PA-C 07/05/15 9811  Alvira Monday, MD 07/06/15 [phone]

## 2015-07-05 NOTE — Discharge Instructions (Signed)

## 2015-07-05 NOTE — ED Notes (Signed)
Pt c/o sore throat onset ~ yesterday  

## 2015-07-07 LAB — CULTURE, GROUP A STREP (THRC)

## 2015-07-08 ENCOUNTER — Encounter (HOSPITAL_COMMUNITY): Payer: Self-pay | Admitting: Emergency Medicine

## 2015-07-08 ENCOUNTER — Emergency Department (HOSPITAL_COMMUNITY): Payer: BLUE CROSS/BLUE SHIELD

## 2015-07-08 ENCOUNTER — Emergency Department (HOSPITAL_COMMUNITY)
Admission: EM | Admit: 2015-07-08 | Discharge: 2015-07-08 | Disposition: A | Discharge status: Home / Self Care | Payer: BLUE CROSS/BLUE SHIELD | Attending: Emergency Medicine | Admitting: Emergency Medicine

## 2015-07-08 DIAGNOSIS — Z8719 Personal history of other diseases of the digestive system: Secondary | ICD-10-CM | POA: Insufficient documentation

## 2015-07-08 DIAGNOSIS — R7989 Other specified abnormal findings of blood chemistry: Secondary | ICD-10-CM | POA: Diagnosis not present

## 2015-07-08 DIAGNOSIS — Z3202 Encounter for pregnancy test, result negative: Secondary | ICD-10-CM | POA: Insufficient documentation

## 2015-07-08 DIAGNOSIS — R51 Headache: Secondary | ICD-10-CM | POA: Insufficient documentation

## 2015-07-08 DIAGNOSIS — R809 Proteinuria, unspecified: Secondary | ICD-10-CM | POA: Diagnosis not present

## 2015-07-08 DIAGNOSIS — M549 Dorsalgia, unspecified: Secondary | ICD-10-CM | POA: Diagnosis not present

## 2015-07-08 DIAGNOSIS — R112 Nausea with vomiting, unspecified: Secondary | ICD-10-CM | POA: Diagnosis present

## 2015-07-08 DIAGNOSIS — R1033 Periumbilical pain: Secondary | ICD-10-CM | POA: Diagnosis not present

## 2015-07-08 DIAGNOSIS — Z791 Long term (current) use of non-steroidal anti-inflammatories (NSAID): Secondary | ICD-10-CM | POA: Insufficient documentation

## 2015-07-08 LAB — URINALYSIS, ROUTINE W REFLEX MICROSCOPIC
BILIRUBIN URINE: NEGATIVE
Glucose, UA: NEGATIVE mg/dL
HGB URINE DIPSTICK: NEGATIVE
KETONES UR: NEGATIVE mg/dL
Leukocytes, UA: NEGATIVE
NITRITE: NEGATIVE
Protein, ur: 100 mg/dL — AB
Specific Gravity, Urine: 1.006 (ref 1.005–1.030)
pH: 5.5 (ref 5.0–8.0)

## 2015-07-08 LAB — COMPREHENSIVE METABOLIC PANEL
ALBUMIN: 3.3 g/dL — AB (ref 3.5–5.0)
ALK PHOS: 42 U/L (ref 38–126)
ALT: 14 U/L (ref 14–54)
ANION GAP: 10 (ref 5–15)
AST: 18 U/L (ref 15–41)
BILIRUBIN TOTAL: 0.8 mg/dL (ref 0.3–1.2)
BUN: 13 mg/dL (ref 6–20)
CALCIUM: 9 mg/dL (ref 8.9–10.3)
CO2: 26 mmol/L (ref 22–32)
CREATININE: 1.17 mg/dL — AB (ref 0.44–1.00)
Chloride: 106 mmol/L (ref 101–111)
GFR calc non Af Amer: >60 mL/min (ref >60)
GLUCOSE: 100 mg/dL — AB (ref 65–99)
Potassium: 4 mmol/L (ref 3.5–5.1)
SODIUM: 142 mmol/L (ref 135–145)
TOTAL PROTEIN: 6.7 g/dL (ref 6.5–8.1)

## 2015-07-08 LAB — CBC
HCT: 37.3 % (ref 36.0–46.0)
Hemoglobin: 12.6 g/dL (ref 12.0–15.0)
MCH: 28.9 pg (ref 26.0–34.0)
MCHC: 33.8 g/dL (ref 30.0–36.0)
MCV: 85.6 fL (ref 78.0–100.0)
PLATELETS: 286 10*3/uL (ref 150–400)
RBC: 4.36 MIL/uL (ref 3.87–5.11)
RDW: 13.5 % (ref 11.5–15.5)
WBC: 8.9 10*3/uL (ref 4.0–10.5)

## 2015-07-08 LAB — URINE MICROSCOPIC-ADD ON: RBC / HPF: NONE SEEN RBC/hpf (ref 0–5)

## 2015-07-08 LAB — PREGNANCY, URINE: Preg Test, Ur: NEGATIVE

## 2015-07-08 LAB — LIPASE, BLOOD: Lipase: 17 U/L (ref 11–51)

## 2015-07-08 MED ORDER — MORPHINE SULFATE (PF) 4 MG/ML IV SOLN
4.0000 mg | Freq: Once | INTRAVENOUS | Status: AC
  Administered 2015-07-08: 4 mg via INTRAVENOUS
  Filled 2015-07-08: qty 1

## 2015-07-08 MED ORDER — ONDANSETRON 4 MG PO TBDP
ORAL_TABLET | ORAL | Status: AC
  Filled 2015-07-08: qty 1

## 2015-07-08 MED ORDER — IOHEXOL 300 MG/ML  SOLN
100.0000 mL | Freq: Once | INTRAMUSCULAR | Status: AC | PRN
  Administered 2015-07-08: 100 mL via INTRAVENOUS

## 2015-07-08 MED ORDER — SODIUM CHLORIDE 0.9 % IV BOLUS (SEPSIS)
1000.0000 mL | Freq: Once | INTRAVENOUS | Status: AC
  Administered 2015-07-08: 1000 mL via INTRAVENOUS

## 2015-07-08 MED ORDER — ONDANSETRON HCL 4 MG/2ML IJ SOLN
4.0000 mg | Freq: Once | INTRAMUSCULAR | Status: AC
  Administered 2015-07-08: 4 mg via INTRAVENOUS
  Filled 2015-07-08: qty 2

## 2015-07-08 MED ORDER — ONDANSETRON 4 MG PO TBDP: 4.0000 mg | ORAL_TABLET | Freq: Three times a day (TID) | ORAL | Status: DC | PRN

## 2015-07-08 MED ORDER — ONDANSETRON 4 MG PO TBDP
4.0000 mg | ORAL_TABLET | Freq: Once | ORAL | Status: AC | PRN
  Administered 2015-07-08: 4 mg via ORAL

## 2015-07-08 NOTE — ED Provider Notes (Signed)
CSN: [id number]     Arrival date & time 07/08/15  1201 History  By signing my name below, I, Ronney Lion, attest that this documentation has been prepared under the direction and in the presence of Melburn Hake, [place]. Electronically Signed: Ronney Lion, ED Scribe. 07/08/2015. 3:55 PM.    Chief Complaint  Patient presents with  . Abdominal Pain   The history is provided by the patient. No language interpreter was used.    HPI Comments: April Crane is a 25 y.o. female who presents to the Emergency Department complaining of constant, aching, occasionally sharp, 10/10 periumbilical abdominal pain and left-sided back pain that began yesterday. She also notes nausea and NBNB vomiting that occurred 2 days ago. She notes she vomited constantly for 4-5 minutes before she stopped, and she has not vomited since. She states her left-sided back pain started yesterday after vomiting. Other associated symptoms include generalized myalgias. Movement exacerbates her abdominal pain. Vomiting did not affect her abdominal pain. She states she taken ibuprofen with mild relief, but she has not tried any other medications. Patient reports she had a similar pain when she had a colon obstruction (10/2010, per medical records), which required hospitalization. Patient denies any known sick contact. Patient does state she was seen here 3 days ago for a sore throat, with a diagnosis of viral pharyngitis. She states her sore throat has since then resolved, although she still has the headache that was present the day she was seen. She denies a history of any abdominal surgeries. She denies fever, chills, hematemesis, hemochezia, dysuria, diarrhea, constipation, or chest pain. Patient states she has not had menses since having a Nexplanon implanted 4 years ago.   PCP: Billee Cashing, MD   Past Medical History  Diagnosis Date  . No pertinent past medical history   . Obstruction of colon [facility])    Past Surgical History   Procedure Laterality Date  . Anterior cruciate ligament repair    . Anterior cruciate ligament repair     Family History  Problem Relation Age of Onset  . Asthma Sister   . Cancer Maternal Grandmother    Social History  Substance Use Topics  . Smoking status: Never Smoker   . Smokeless tobacco: None  . Alcohol Use: No   OB History    Gravida Para Term Preterm AB TAB SAB Ectopic Multiple Living   0 0             Review of Systems  Constitutional: Negative for fever and chills.  Cardiovascular: Negative for chest pain.  Gastrointestinal: Positive for nausea (resolved), vomiting (resolved) and abdominal pain. Negative for diarrhea, constipation and blood in stool.  Genitourinary: Negative for dysuria.  Musculoskeletal: Positive for myalgias (generalized) and back pain.  Neurological: Positive for headaches (pt attributes to viral pharyngitis).      Allergies  Other  Home Medications   Prior to Admission medications   Medication Sig Start Date End Date Taking? Authorizing Provider  cephALEXin (KEFLEX) 500 MG capsule Take 1 capsule (500 mg total) by mouth 4 (four) times daily. Patient not taking: Reported on 02/23/2015 02/02/15   Teressa Lower, NP  diclofenac (VOLTAREN) 50 MG EC tablet Take 1 tablet (50 mg total) by mouth 2 (two) times daily. Patient not taking: Reported on 02/23/2015 12/17/14   [facility] Orlene Och, NP  doxycycline (VIBRAMYCIN) 100 MG capsule Take 1 capsule (100 mg total) by mouth 2 (two) times daily. Patient not taking: Reported on 02/23/2015 07/17/14  Tatyana Kirichenko, PA-C  etonogestrel (IMPLANON) 68 MG IMPL implant Inject 1 each into the skin once.    Historical Provider, MD  ibuprofen (ADVIL,MOTRIN) 800 MG tablet Take 1 tablet (800 mg total) by mouth 3 (three) times daily. 07/05/15   Cheri Fowler, PA-C  lidocaine (XYLOCAINE) 2 % solution Use as directed 20 mLs in the mouth or throat as needed for mouth pain. 07/05/15   Cheri Fowler, PA-C  metroNIDAZOLE (FLAGYL)  500 MG tablet Take 1 tablet (500 mg total) by mouth 2 (two) times daily. Patient not taking: Reported on 02/23/2015 01/17/14   Francee Piccolo, PA-C  ondansetron (ZOFRAN ODT) 4 MG disintegrating tablet Take 1 tablet (4 mg total) by mouth every 8 (eight) hours as needed for nausea or vomiting. 07/08/15   Barrett Henle, PA-C  trimethoprim-polymyxin b (POLYTRIM) ophthalmic solution Place 1 drop into both eyes every 4 (four) hours. X 7 days 02/23/15   Trixie Dredge, PA-C   BP 103/85 mmHg  Pulse 80  Temp(Src) 98.3 F (36.8 C) (Oral)  Resp 18  Ht  (1.626 m)  Wt 82.101 kg  BMI 31.05 kg/m2  SpO2 100% Physical Exam  Constitutional: She is oriented to person, place, and time. She appears well-developed and well-nourished.  HENT:  Head: Normocephalic and atraumatic.  Mouth/Throat: Uvula is midline, oropharynx is clear and moist and mucous membranes are normal. No oropharyngeal exudate, posterior oropharyngeal edema, posterior oropharyngeal erythema or tonsillar abscesses.  Eyes: Conjunctivae and EOM are normal. Right eye exhibits no discharge. Left eye exhibits no discharge. No scleral icterus.  Neck: Normal range of motion. Neck supple.  Cardiovascular: Normal rate, regular rhythm, normal heart sounds and intact distal pulses.   Pulmonary/Chest: Effort normal and breath sounds normal. No respiratory distress. She has no wheezes. She has no rales. She exhibits no tenderness.  Abdominal: Soft. Bowel sounds are normal. There is tenderness in the periumbilical area. There is no rigidity, no rebound, no guarding and no CVA tenderness.  Periumbilical tenderness to palpation.   Musculoskeletal: She exhibits no edema.  Neurological: She is alert and oriented to person, place, and time. She has normal strength. No sensory deficit. Gait normal.  Pt able to stand and ambulate without assistance.  Skin: Skin is warm and dry.  Nursing note and vitals reviewed.   ED Course  Procedures  (including critical care time)  DIAGNOSTIC STUDIES: Oxygen Saturation is 99% on RA, normal by my interpretation.    COORDINATION OF CARE: 1:52 PM - Discussed treatment plan with pt at bedside which includes IV fluids, nausea medication, and CT abdomen to r/o possibly a recurrent obstruction. Pt verbalized understanding and agreed to plan. Pt has no other questions at this time.    3:45 PM - Discussed pt with attending physician Dr. Rolland Porter, who recommends Rx antibiotics and f/u with nephrologist.   Labs Review Labs Reviewed  COMPREHENSIVE METABOLIC PANEL - Abnormal; Notable for the following:    Glucose, Bld 100 (*)    Creatinine, Ser 1.17 (*)    Albumin 3.3 (*)    All other components within normal limits  URINALYSIS, ROUTINE W REFLEX MICROSCOPIC (NOT AT [facility]) - Abnormal; Notable for the following:    Protein, ur 100 (*)    All other components within normal limits  URINE MICROSCOPIC-ADD ON - Abnormal; Notable for the following:    Squamous Epithelial / LPF 6-30 (*)    Bacteria, UA RARE (*)    All other components within normal limits  LIPASE,  BLOOD  CBC  PREGNANCY, URINE    Imaging Review Ct Abdomen Pelvis W Contrast  07/08/2015  CLINICAL DATA:  Constant aching and occasionally sharp periumbilical pain and back pain EXAM: CT ABDOMEN AND PELVIS WITH CONTRAST TECHNIQUE: Multidetector CT imaging of the abdomen and pelvis was performed using the standard protocol following bolus administration of intravenous contrast. CONTRAST:  OMNIPAQUE IOHEXOL 300 MG/ML  SOLN COMPARISON:  11/10/2011 FINDINGS: Lower chest:  The lung bases are clear.  No pleural effusion. Hepatobiliary: No suspicious liver abnormalities identified. The gallbladder appears normal. There is no biliary dilatation. Pancreas: Negative Spleen: The spleen appears normal. Adrenals/Urinary Tract: The adrenal glands are normal. There is a suggestion of striated nephrographic appearance of the kidneys. No hydronephrosis.  The urinary bladder appears normal. Stomach/Bowel: The stomach is within normal limits. The small bowel loops have a normal course and caliber. No obstruction. Normal appearance of the colon. The appendix is visualized and appears normal. Vascular/Lymphatic: Normal appearance of the abdominal aorta. No enlarged retroperitoneal or mesenteric adenopathy. No enlarged pelvic or inguinal lymph nodes. Reproductive: The uterus and adnexal structures appear normal. Other: There is no ascites or focal fluid collections within the abdomen or pelvis. Musculoskeletal: No aggressive lytic or sclerotic bone lesions identified. IMPRESSION: 1. There is a suggestion of a striated nephrograms bilaterally which may be seen with pyelonephritis. Correlation for any clinical signs or symptoms of urinary tract infection recommended. 2. Remainder of the examination is unremarkable. Electronically Signed   By: Signa Kell M.D.   On: 07/08/2015 15:14   I have personally reviewed and evaluated these images and lab results as part of my medical decision-making.  MDM   Final diagnoses:  Non-intractable vomiting with nausea, vomiting of unspecified type  Elevated serum creatinine  Proteinuria   Patient presents with abdominal pain and associated nausea/vomiting. VSS. Exam revealed mild periumbilical tenderness, no peritoneal signs, no CVA tenderness, remaining exam unremarkable. No back pain red flags. Patient given IV fluids, Zofran and pain meds in the ED.  Creatinine elevated at 1.17. Protein seen in urine. Remaining labs unremarkable. Pregnancy negative. Due to patient's history of obstruction in reporting her pain feels similar, will order CT abdomen for further evaluation. CT abdomen showed suggestion of a striated nephrograms bilaterally which may be seen with pyelonephritis, remaining exam unremarkable. I do not suspect patient has pyelonephritis at this time due to urine not appearing to have any signs of infection. I  suspect patient's symptoms are likely due to viral gastroenteritis. Patient able tolerate by mouth in the ED. However due to patient's elevated creatinine and proteinuria will plan to have patient follow up with nephrology outpatient. Discussed results and plan for discharge with patient with symptomatic tx. Advised patient to refrain from using Motrin or nephrotoxic drugs due to her elevated creatinine.  Evaluation does not show pathology requring ongoing emergent intervention or admission. Pt is hemodynamically stable and mentating appropriately. Discussed findings/results and plan with patient/guardian, who agrees with plan. All questions answered. Return precautions discussed and outpatient follow up given.    I personally performed the services described in this documentation, which was scribed in my presence. The recorded information has been reviewed and is accurate.     Satira Sark [place], [place] 07/08/15 1649  Rolland Porter, MD 07/21/15 0010

## 2015-07-08 NOTE — ED Notes (Signed)
PA at bedside.

## 2015-07-08 NOTE — Discharge Instructions (Signed)
Take your medications as prescribed as needed for nausea/vomiting. You may also take Tylenol as prescribed over-the-counter as needed for pain relief. Continue drinking fluids at home to remain hydrated. I recommend eating a bland diet for the next few days until your symptoms have improved. Call the nephrology clinic listed above to schedule a follow-up appointment for further evaluation regarding your elevated creatinine (1.17) and protein in your urine that was seen during her ED visit today. Please return to the Emergency Department if symptoms worsen or new onset of fever, uncontrollable vomiting, unable to tolerate fluids, abdominal pain, diarrhea, blood in urine or stool, pain/discomfort with urinating, weakness.

## 2015-07-08 NOTE — ED Notes (Signed)
Pt states she has been having abd pain and n/v for 2 days now. abd pain upper mid.

## 2015-07-13 ENCOUNTER — Inpatient Hospital Stay (HOSPITAL_COMMUNITY): Payer: BLUE CROSS/BLUE SHIELD

## 2015-07-13 ENCOUNTER — Inpatient Hospital Stay (HOSPITAL_COMMUNITY)
Admission: EM | Admit: 2015-07-13 | Discharge: 2015-07-15 | DRG: 700 | Disposition: A | Discharge status: Home / Self Care | Payer: BLUE CROSS/BLUE SHIELD | Attending: Internal Medicine | Admitting: Internal Medicine

## 2015-07-13 ENCOUNTER — Ambulatory Visit: Payer: BLUE CROSS/BLUE SHIELD

## 2015-07-13 ENCOUNTER — Encounter (HOSPITAL_COMMUNITY): Payer: Self-pay | Admitting: *Deleted

## 2015-07-13 DIAGNOSIS — R531 Weakness: Secondary | ICD-10-CM

## 2015-07-13 DIAGNOSIS — Z791 Long term (current) use of non-steroidal anti-inflammatories (NSAID): Secondary | ICD-10-CM | POA: Diagnosis not present

## 2015-07-13 DIAGNOSIS — Z881 Allergy status to other antibiotic agents status: Secondary | ICD-10-CM | POA: Diagnosis not present

## 2015-07-13 DIAGNOSIS — R079 Chest pain, unspecified: Secondary | ICD-10-CM | POA: Insufficient documentation

## 2015-07-13 DIAGNOSIS — R0789 Other chest pain: Secondary | ICD-10-CM | POA: Diagnosis present

## 2015-07-13 DIAGNOSIS — M549 Dorsalgia, unspecified: Secondary | ICD-10-CM | POA: Diagnosis present

## 2015-07-13 DIAGNOSIS — Z79899 Other long term (current) drug therapy: Secondary | ICD-10-CM

## 2015-07-13 DIAGNOSIS — Z809 Family history of malignant neoplasm, unspecified: Secondary | ICD-10-CM

## 2015-07-13 DIAGNOSIS — N059 Unspecified nephritic syndrome with unspecified morphologic changes: Principal | ICD-10-CM | POA: Diagnosis present

## 2015-07-13 DIAGNOSIS — L732 Hidradenitis suppurativa: Secondary | ICD-10-CM | POA: Diagnosis present

## 2015-07-13 DIAGNOSIS — R1084 Generalized abdominal pain: Secondary | ICD-10-CM | POA: Diagnosis not present

## 2015-07-13 DIAGNOSIS — R109 Unspecified abdominal pain: Secondary | ICD-10-CM | POA: Diagnosis present

## 2015-07-13 DIAGNOSIS — Z825 Family history of asthma and other chronic lower respiratory diseases: Secondary | ICD-10-CM | POA: Diagnosis not present

## 2015-07-13 DIAGNOSIS — N179 Acute kidney failure, unspecified: Secondary | ICD-10-CM | POA: Diagnosis present

## 2015-07-13 LAB — URINALYSIS, ROUTINE W REFLEX MICROSCOPIC
Bilirubin Urine: NEGATIVE
Glucose, UA: NEGATIVE mg/dL
Ketones, ur: NEGATIVE mg/dL
Leukocytes, UA: NEGATIVE
NITRITE: NEGATIVE
Protein, ur: NEGATIVE mg/dL
SPECIFIC GRAVITY, URINE: 1.006 (ref 1.005–1.030)
pH: 6.5 (ref 5.0–8.0)

## 2015-07-13 LAB — COMPREHENSIVE METABOLIC PANEL
ALT: 15 U/L (ref 14–54)
ANION GAP: 9 (ref 5–15)
AST: 16 U/L (ref 15–41)
Albumin: 3.6 g/dL (ref 3.5–5.0)
Alkaline Phosphatase: 40 U/L (ref 38–126)
BUN: 19 mg/dL (ref 6–20)
CALCIUM: 9.2 mg/dL (ref 8.9–10.3)
CHLORIDE: 105 mmol/L (ref 101–111)
CO2: 27 mmol/L (ref 22–32)
Creatinine, Ser: 2.33 mg/dL — ABNORMAL HIGH (ref 0.44–1.00)
GFR calc non Af Amer: 28 mL/min — ABNORMAL LOW (ref >60)
GFR, EST AFRICAN AMERICAN: 33 mL/min — AB (ref >60)
Glucose, Bld: 86 mg/dL (ref 65–99)
POTASSIUM: 4.8 mmol/L (ref 3.5–5.1)
SODIUM: 141 mmol/L (ref 135–145)
TOTAL PROTEIN: 7.6 g/dL (ref 6.5–8.1)
Total Bilirubin: 0.2 mg/dL — ABNORMAL LOW (ref 0.3–1.2)

## 2015-07-13 LAB — CBC
HCT: 39.5 % (ref 36.0–46.0)
HEMOGLOBIN: 13 g/dL (ref 12.0–15.0)
MCH: 28.6 pg (ref 26.0–34.0)
MCHC: 32.9 g/dL (ref 30.0–36.0)
MCV: 87 fL (ref 78.0–100.0)
Platelets: 337 10*3/uL (ref 150–400)
RBC: 4.54 MIL/uL (ref 3.87–5.11)
RDW: 13.4 % (ref 11.5–15.5)
WBC: 7 10*3/uL (ref 4.0–10.5)

## 2015-07-13 LAB — URINE MICROSCOPIC-ADD ON

## 2015-07-13 LAB — LIPASE, BLOOD: LIPASE: 18 U/L (ref 11–51)

## 2015-07-13 LAB — PREGNANCY, URINE: PREG TEST UR: NEGATIVE

## 2015-07-13 LAB — MRSA PCR SCREENING: MRSA by PCR: NEGATIVE

## 2015-07-13 MED ORDER — ACETAMINOPHEN 650 MG RE SUPP: 650.0000 mg | Freq: Four times a day (QID) | RECTAL | Status: DC | PRN

## 2015-07-13 MED ORDER — ONDANSETRON HCL 4 MG/2ML IJ SOLN: 4.0000 mg | Freq: Three times a day (TID) | INTRAMUSCULAR | Status: DC | PRN

## 2015-07-13 MED ORDER — SODIUM CHLORIDE 0.9 % IV BOLUS (SEPSIS)
1000.0000 mL | Freq: Once | INTRAVENOUS | Status: AC
  Administered 2015-07-13: 1000 mL via INTRAVENOUS

## 2015-07-13 MED ORDER — ENOXAPARIN SODIUM 40 MG/0.4ML ~~LOC~~ SOLN
40.0000 mg | SUBCUTANEOUS | Status: DC
  Administered 2015-07-13 – 2015-07-14 (×2): 40 mg via SUBCUTANEOUS
  Filled 2015-07-13 (×2): qty 0.4

## 2015-07-13 MED ORDER — HYDROMORPHONE HCL 1 MG/ML IJ SOLN
0.5000 mg | INTRAMUSCULAR | Status: DC | PRN
  Administered 2015-07-13: 1 mg via INTRAVENOUS
  Filled 2015-07-13: qty 1

## 2015-07-13 MED ORDER — ONDANSETRON HCL 4 MG PO TABS: 4.0000 mg | ORAL_TABLET | Freq: Four times a day (QID) | ORAL | Status: DC | PRN

## 2015-07-13 MED ORDER — ACETAMINOPHEN 325 MG PO TABS: 650.0000 mg | ORAL_TABLET | Freq: Four times a day (QID) | ORAL | Status: DC | PRN

## 2015-07-13 MED ORDER — SODIUM CHLORIDE 0.9 % IV SOLN
INTRAVENOUS | Status: DC
  Administered 2015-07-13 – 2015-07-14 (×2): via INTRAVENOUS
  Administered 2015-07-15 (×2): 1000 mL via INTRAVENOUS

## 2015-07-13 MED ORDER — OXYCODONE HCL 5 MG PO TABS
5.0000 mg | ORAL_TABLET | ORAL | Status: DC | PRN
  Administered 2015-07-14: 5 mg via ORAL
  Filled 2015-07-13: qty 1

## 2015-07-13 MED ORDER — ONDANSETRON HCL 4 MG/2ML IJ SOLN
4.0000 mg | Freq: Four times a day (QID) | INTRAMUSCULAR | Status: DC | PRN
  Administered 2015-07-13: 4 mg via INTRAVENOUS
  Filled 2015-07-13: qty 2

## 2015-07-13 MED ORDER — ALUM & MAG HYDROXIDE-SIMETH 200-200-20 MG/5ML PO SUSP: 30.0000 mL | Freq: Four times a day (QID) | ORAL | Status: DC | PRN

## 2015-07-13 NOTE — ED Notes (Signed)
Reports back and abd pain since Monday, nonspecific description

## 2015-07-13 NOTE — ED Notes (Signed)
Unable to start pt's IV. Will have 2nd RN attempt.

## 2015-07-13 NOTE — ED Notes (Signed)
Will transport to 3rd floor after ultrasound, that is in progress, is finished

## 2015-07-13 NOTE — ED Provider Notes (Signed)
CSN: [id number]     Arrival date & time 07/13/15  1337 History   First MD Initiated Contact with Patient 07/13/15 1607     Chief Complaint  Patient presents with  . Back Pain  . Abdominal Pain     (Consider location/radiation/quality/duration/timing/severity/associated sxs/prior Treatment) The history is provided by the patient.   April Crane is a 25 y.o. female with no significant past medical history presenting with persistent bilateral back pain, mild abdominal pain with nausea and increased fatigue and weakness since her last visit here one week ago.  At that time it was felt she had a gastroenteritis and was found to have an elevated creatinine at 1.17 along with proteinuria, although had no urinary complaints at that time.  She has seen her pcp this week for recheck who prescribed hydrocodone for presumptive low back pain.  She returns today due to persistent symptoms of pain and nausea along with decreased appetite and increasing fatigue and weakness.  She last ate yesterday and endorses episodes of lightheadedness when standing too quickly.  She denies vomiting, diarrhea, dysuria, hematuria vaginal dc but does endorse increased urinary frequency.  Her LMP was "years ago" due to Implanon.  Of note, she was seen here for pharyngitis on 2/25.  Her strep screen and culture was negative.     Past Medical History  Diagnosis Date  . No pertinent past medical history   . Obstruction of colon [facility])    Past Surgical History  Procedure Laterality Date  . Anterior cruciate ligament repair    . Anterior cruciate ligament repair     Family History  Problem Relation Age of Onset  . Asthma Sister   . Cancer Maternal Grandmother    Social History  Substance Use Topics  . Smoking status: Never Smoker   . Smokeless tobacco: None  . Alcohol Use: No   OB History    Gravida Para Term Preterm AB TAB SAB Ectopic Multiple Living   0 0             Review of Systems  Constitutional:  Positive for activity change, appetite change and fatigue. Negative for fever.  HENT: Negative for congestion and sore throat.   Eyes: Negative.   Respiratory: Negative for chest tightness and shortness of breath.   Cardiovascular: Negative for chest pain.  Gastrointestinal: Positive for abdominal pain. Negative for nausea.  Genitourinary: Positive for frequency. Negative for dysuria and vaginal discharge.  Musculoskeletal: Positive for back pain. Negative for joint swelling, arthralgias and neck pain.  Skin: Negative.  Negative for rash and wound.  Neurological: Negative for dizziness, weakness, light-headedness, numbness and headaches.  Psychiatric/Behavioral: Negative.       Allergies  Other  Home Medications   Prior to Admission medications   Medication Sig Start Date End Date Taking? Authorizing Provider  cephALEXin (KEFLEX) 500 MG capsule Take 1 capsule (500 mg total) by mouth 4 (four) times daily. Patient not taking: Reported on 02/23/2015 02/02/15   Teressa Lower, NP  diclofenac (VOLTAREN) 50 MG EC tablet Take 1 tablet (50 mg total) by mouth 2 (two) times daily. Patient not taking: Reported on 02/23/2015 12/17/14   [facility] Orlene Och, NP  doxycycline (VIBRAMYCIN) 100 MG capsule Take 1 capsule (100 mg total) by mouth 2 (two) times daily. Patient not taking: Reported on 02/23/2015 07/17/14   Jaynie Crumble, PA-C  etonogestrel (IMPLANON) 68 MG IMPL implant Inject 1 each into the skin once.    Historical Provider, MD  ibuprofen (  ADVIL,MOTRIN) 800 MG tablet Take 1 tablet (800 mg total) by mouth 3 (three) times daily. 07/05/15   Cheri FowlerKayla Rose, PA-C  lidocaine (XYLOCAINE) 2 % solution Use as directed 20 mLs in the mouth or throat as needed for mouth pain. 07/05/15   Cheri FowlerKayla Rose, PA-C  metroNIDAZOLE (FLAGYL) 500 MG tablet Take 1 tablet (500 mg total) by mouth 2 (two) times daily. Patient not taking: Reported on 02/23/2015 01/17/14   Francee PiccoloJennifer Piepenbrink, PA-C  ondansetron (ZOFRAN ODT) 4 MG  disintegrating tablet Take 1 tablet (4 mg total) by mouth every 8 (eight) hours as needed for nausea or vomiting. 07/08/15   Barrett HenleNicole Elizabeth Nadeau, PA-C  trimethoprim-polymyxin b (POLYTRIM) ophthalmic solution Place 1 drop into both eyes every 4 (four) hours. X 7 days 02/23/15   Trixie DredgeEmily West, PA-C   BP 129/84 mmHg  Pulse 71  Temp(Src) 98.7 F (37.1 C) (Oral)  Resp 17  SpO2 100% Physical Exam  Constitutional: She appears well-developed and well-nourished.  HENT:  Head: Normocephalic and atraumatic.  Eyes: Conjunctivae are normal.  Neck: Normal range of motion.  Cardiovascular: Normal rate, regular rhythm, normal heart sounds and intact distal pulses.   Pulmonary/Chest: Effort normal and breath sounds normal. She has no wheezes.  Abdominal: Soft. Bowel sounds are normal. She exhibits no distension. There is no tenderness. There is CVA tenderness. There is no rebound and no guarding.  Musculoskeletal: Normal range of motion.  Neurological: She is alert.  Skin: Skin is warm and dry.  Psychiatric: She has a normal mood and affect.  Nursing note and vitals reviewed.   ED Course  Procedures (including critical care time) Labs Review Labs Reviewed  COMPREHENSIVE METABOLIC PANEL - Abnormal; Notable for the following:    Creatinine, Ser 2.33 (*)    Total Bilirubin 0.2 (*)    GFR calc non Af Amer 28 (*)    GFR calc Af Amer 33 (*)    All other components within normal limits  LIPASE, BLOOD  CBC  PREGNANCY, URINE  URINALYSIS, ROUTINE W REFLEX MICROSCOPIC (NOT AT [facility])    Imaging Review No results found. I have personally reviewed and evaluated these images and lab results as part of my medical decision-making.   EKG Interpretation None      MDM   Final diagnoses:  Acute renal failure, unspecified acute renal failure type [facility](HCC)    Discussed with Dr. Kathrene BongoGoldsborough of renal, recommended medical admission with renal to consult.  Discussed with Dr. Lovell SheehanJenkins with tried hospitalist  who agrees with admission.  Temporary admission orders were placed.    Burgess AmorJulie Dorenda Pfannenstiel, PA-C 07/13/15 1729  Nelva Nayobert Beaton, MD 07/14/15 [phone]

## 2015-07-13 NOTE — H&P (Signed)
[facility] Admission History and Physical       April StaggersKeisha L Crane RUE:[id number] DOB: May 14, 1990 DOA: 07/13/2015  Referring physician:  PCP: April CashingMCKENZIE, WAYLAND, Crane  Specialists:   Chief Complaint: ABD and BAck Pain   HPI: April Crane is a 25 y.o. female with no previous health problems who presents to the ED with complaints of ABD and Back Pain x 6 days.    She was found to have an elevated creatinine compared to her previous levels of 1.17 on 02/28, and 0.6 from 1.5 years ago.   She denies having any nausea or vomiting or diarrhea.   She had acute pharynitis on 02/ 25 but was negative for Strep Infection.    She was referred for further workup.  The EDP contacted and consulted Renal for evaluation and Dr Kathrene BongoGoldsborough will see the patient in the AM.      Review of Systems:  Constitutional: No Weight Loss, No Weight Gain, Night Sweats, Fevers, Chills, Dizziness, Light Headedness, Fatigue, or Generalized Weakness HEENT: No Headaches, Difficulty Swallowing,Tooth/Dental Problems,Sore Throat,  No Sneezing, Rhinitis, Ear Ache, Nasal Congestion, or Post Nasal Drip,  Cardio-vascular:  No Chest pain, Orthopnea, PND, Edema in Lower Extremities, Anasarca, Dizziness, Palpitations  Resp: No Dyspnea, No DOE, No Productive Cough, No Non-Productive Cough, No Hemoptysis, No Wheezing.    GI: No Heartburn, Indigestion, Abdominal Pain, Nausea, Vomiting, Diarrhea, Constipation, Hematemesis, Hematochezia, Melena, Change in Bowel Habits,  Loss of Appetite  GU: No Dysuria, No Change in Color of Urine, No Urgency or Urinary Frequency, No Flank pain.  Musculoskeletal: No Joint Pain or Swelling, No Decreased Range of Motion, No Back Pain.  Neurologic: No Syncope, No Seizures, Muscle Weakness, Paresthesia, Vision Disturbance or Loss, No Diplopia, No Vertigo, No Difficulty Walking,  Skin: No Rash or Lesions. Psych: No Change in Mood or Affect, No Depression or Anxiety, No Memory loss, No Confusion, or  Hallucinations   Past Medical History  Diagnosis Date  . No pertinent past medical history   . Obstruction of colon [facility](HCC)      Past Surgical History  Procedure Laterality Date  . Anterior cruciate ligament repair    . Anterior cruciate ligament repair        Prior to Admission medications   Medication Sig Start Date End Date Taking? Authorizing Provider  cephALEXin (KEFLEX) 500 MG capsule Take 1 capsule (500 mg total) by mouth 4 (four) times daily. Patient not taking: Reported on 02/23/2015 02/02/15   Teressa LowerVrinda Pickering, NP  diclofenac (VOLTAREN) 50 MG EC tablet Take 1 tablet (50 mg total) by mouth 2 (two) times daily. Patient not taking: Reported on 02/23/2015 12/17/14   [facility] Orlene OchM Neese, NP  doxycycline (VIBRAMYCIN) 100 MG capsule Take 1 capsule (100 mg total) by mouth 2 (two) times daily. Patient not taking: Reported on 02/23/2015 07/17/14   Jaynie Crumbleatyana Kirichenko, PA-C  etonogestrel (IMPLANON) 68 MG IMPL implant Inject 1 each into the skin once.    Historical Provider, Crane  ibuprofen (ADVIL,MOTRIN) 800 MG tablet Take 1 tablet (800 mg total) by mouth 3 (three) times daily. 07/05/15   Cheri FowlerKayla Rose, PA-C  lidocaine (XYLOCAINE) 2 % solution Use as directed 20 mLs in the mouth or throat as needed for mouth pain. 07/05/15   Cheri FowlerKayla Rose, PA-C  metroNIDAZOLE (FLAGYL) 500 MG tablet Take 1 tablet (500 mg total) by mouth 2 (two) times daily. Patient not taking: Reported on 02/23/2015 01/17/14   Francee PiccoloJennifer Piepenbrink, PA-C  ondansetron (ZOFRAN ODT) 4 MG disintegrating tablet Take  1 tablet (4 mg total) by mouth every 8 (eight) hours as needed for nausea or vomiting. 07/08/15   Barrett Henle, PA-C  trimethoprim-polymyxin b (POLYTRIM) ophthalmic solution Place 1 drop into both eyes every 4 (four) hours. X 7 days 02/23/15   Trixie Dredge, PA-C     Allergies  Allergen Reactions  . Other Nausea And Vomiting    Antibiotic    Social History:  reports that she has never smoked. She does not have any smokeless  tobacco history on file. She reports that she does not drink alcohol or use illicit drugs.    Family History  Problem Relation Age of Onset  . Asthma Sister   . Cancer Maternal Grandmother        Physical Exam:  GEN:   Pleasant  Well Nourished and Well Developed   25 y.o. African American  female examined and in no acute distress; cooperative with exam Filed Vitals:   07/13/15 1440 07/13/15 1646  BP: 114/78 129/84  Pulse: 75 71  Temp: 98.7 F (37.1 C)   TempSrc: Oral   Resp: 20 17  SpO2: 100% 100%   Blood pressure 129/84, pulse 71, temperature 98.7 F (37.1 C), temperature source Oral, resp. rate 17, SpO2 100 %. PSYCH: She is alert and oriented x4; does not appear anxious does not appear depressed; affect is normal HEENT: Normocephalic and Atraumatic, Mucous membranes pink; PERRLA; EOM intact; Fundi:  Benign;  No scleral icterus, Nares: Patent, Oropharynx: Clear, Fair Dentition,    Neck:  FROM, No Cervical Lymphadenopathy nor Thyromegaly or Carotid Bruit; No JVD; Breasts:: Not examined CHEST WALL: No tenderness CHEST: Normal respiration, clear to auscultation bilaterally HEART: Regular rate and rhythm; no murmurs rubs or gallops BACK: No kyphosis or scoliosis; No CVA tenderness ABDOMEN: Positive Bowel Sounds, Soft Non-Tender, No Rebound or Guarding; No Masses, No Organomegaly Rectal Exam: Not done EXTREMITIES: No Cyanosis, Clubbing, or Edema; No Ulcerations. Genitalia: not examined PULSES: 2+ and symmetric SKIN: Normal hydration no rash or ulceration CNS:  Alert and Oriented x 4, No Focal Deficits Vascular: pulses palpable throughout    Labs on Admission:  Basic Metabolic Panel:  Recent Labs Lab 07/08/15 1213 07/13/15 1506  NA 142 141  K 4.0 4.8  CL 106 105  CO2 26 27  GLUCOSE 100* 86  BUN 13 19  CREATININE 1.17* 2.33*  CALCIUM 9.0 9.2   Liver Function Tests:  Recent Labs Lab 07/08/15 1213 07/13/15 1506  AST 18 16  ALT 14 15  ALKPHOS 42 40  BILITOT  0.8 0.2*  PROT 6.7 7.6  ALBUMIN 3.3* 3.6    Recent Labs Lab 07/08/15 1213 07/13/15 1506  LIPASE 17 18   No results for input(s): AMMONIA in the last 168 hours. CBC:  Recent Labs Lab 07/08/15 1213 07/13/15 1506  WBC 8.9 7.0  HGB 12.6 13.0  HCT 37.3 39.5  MCV 85.6 87.0  PLT 286 337   Cardiac Enzymes: No results for input(s): CKTOTAL, CKMB, CKMBINDEX, TROPONINI in the last 168 hours.  BNP (last 3 results) No results for input(s): BNP in the last 8760 hours.  ProBNP (last 3 results) No results for input(s): PROBNP in the last 8760 hours.  CBG: No results for input(s): GLUCAP in the last 168 hours.  Radiological Exams on Admission: No results found.     Assessment/Plan:   25 y.o. female with  Active Problems:     AKI (acute kidney injury) (HCC)    IVFs    Monitor  BUN/Cr    Renal US ordered    Renal Consulted to see in AM     DVT Prophylaxis    Lovenox         Code Status:     FULL CODE   Family Communication:   Mother at Bedside      Disposition Plan:    Inpatient  Status        Time spent: 46 Minutes      Ron Parker [facility] Pager [phone]   If 7AM -7PM Please Contact the Day Rounding Team [facility] [facility]  If 7PM-7AM, Please Contact Night-Floor Coverage  [email or web address] Password [email or web address] 07/13/2015, 5:28 PM     ADDENDUM:   Patient was seen and examined on 07/13/2015

## 2015-07-14 ENCOUNTER — Inpatient Hospital Stay (HOSPITAL_COMMUNITY): Payer: BLUE CROSS/BLUE SHIELD

## 2015-07-14 DIAGNOSIS — R1084 Generalized abdominal pain: Secondary | ICD-10-CM

## 2015-07-14 DIAGNOSIS — R109 Unspecified abdominal pain: Secondary | ICD-10-CM

## 2015-07-14 DIAGNOSIS — R531 Weakness: Secondary | ICD-10-CM

## 2015-07-14 DIAGNOSIS — N179 Acute kidney failure, unspecified: Secondary | ICD-10-CM

## 2015-07-14 LAB — CBC
HEMATOCRIT: 35.1 % — AB (ref 36.0–46.0)
Hemoglobin: 11.6 g/dL — ABNORMAL LOW (ref 12.0–15.0)
MCH: 28.6 pg (ref 26.0–34.0)
MCHC: 33 g/dL (ref 30.0–36.0)
MCV: 86.5 fL (ref 78.0–100.0)
PLATELETS: 298 10*3/uL (ref 150–400)
RBC: 4.06 MIL/uL (ref 3.87–5.11)
RDW: 13.4 % (ref 11.5–15.5)
WBC: 6.6 10*3/uL (ref 4.0–10.5)

## 2015-07-14 LAB — BASIC METABOLIC PANEL
Anion gap: 7 (ref 5–15)
BUN: 17 mg/dL (ref 6–20)
CHLORIDE: 108 mmol/L (ref 101–111)
CO2: 24 mmol/L (ref 22–32)
Calcium: 8.5 mg/dL — ABNORMAL LOW (ref 8.9–10.3)
Creatinine, Ser: 1.84 mg/dL — ABNORMAL HIGH (ref 0.44–1.00)
GFR calc Af Amer: 43 mL/min — ABNORMAL LOW (ref >60)
GFR calc non Af Amer: 37 mL/min — ABNORMAL LOW (ref >60)
Glucose, Bld: 102 mg/dL — ABNORMAL HIGH (ref 65–99)
POTASSIUM: 4.6 mmol/L (ref 3.5–5.1)
SODIUM: 139 mmol/L (ref 135–145)

## 2015-07-14 LAB — TROPONIN I
Troponin I: <0.03 ng/mL (ref <0.031)
Troponin I: <0.03 ng/mL (ref <0.031)

## 2015-07-14 NOTE — Consult Note (Signed)
Renal Service Consult Note [facility] 07/14/2015 [place] [facility] Requesting Physician:  Dr Tat  Reason for Consult:  Acute renal failure HPI: The patient is a 25 y.o. year-old presented on 3/5 to ED with c/o bilat flank pain, abd pain and nausea with general fatiugue and weakness.  Was in ED last week, felt to have gastroenteritis. Creat was 1.17 last week in ED.  Creat was 0.7 in 2015.    Patient has hidradenitis suppurativa and has been on antiobiotics.  She takes a lot of Advil for headaches and pain when she has surgery on her axillae.  Recently her Advil dose was increased to 800 mg from 400 mg. She was seen in ED last week and creat was up so they told her to stop Advil which she did.   Denies change in urine color, hematuria, hx kidney disease.  No gross hematuria.  No hx HTN or HD.  No hair loss or mouth sores.  No paralysis , no hemoptysis, SOB , cp or abd pain.    Home meds: Implanon, norco, zofran, Keflex, Advil, lidocaine 2 % soln  Date   Creat  eGFR 2012    0.55- 0.62 >60 2013   0.65- 0.70 2015    0.61- 0.77 Jul 08, 2015  1.17  >60 Jul 13, 2015   2.33  28 Jul 14, 2015  1.84  37   ROS  denies CP  no joint pain   no HA  no blurry vision  no rash  no diarrhea  no nausea/ vomiting  no dysuria  no difficulty voiding  no change in urine color    Past Medical History  Past Medical History  Diagnosis Date  . No pertinent past medical history   . Obstruction of colon [facility])    Past Surgical History  Past Surgical History  Procedure Laterality Date  . Anterior cruciate ligament repair    . Anterior cruciate ligament repair     Family History  Family History  Problem Relation Age of Onset  . Asthma Sister   . Cancer Maternal Grandmother    Social History  reports that she has never smoked. She does not have any smokeless tobacco history on file. She reports that she does not drink alcohol or use illicit drugs. Allergies   Allergies  Allergen Reactions  . Other Nausea And Vomiting    Antibiotic   Home medications Prior to Admission medications   Medication Sig Start Date End Date Taking? Authorizing Provider  etonogestrel (IMPLANON) 68 MG IMPL implant Inject 1 each into the skin once. Place 07/2014   Yes Historical Provider, MD  HYDROcodone-acetaminophen (NORCO/VICODIN) 5-325 MG tablet Take 1 tablet by mouth every 4 (four) hours as needed for moderate pain.  07/10/15  Yes Historical Provider, MD  ondansetron (ZOFRAN ODT) 4 MG disintegrating tablet Take 1 tablet (4 mg total) by mouth every 8 (eight) hours as needed for nausea or vomiting. 07/08/15  Yes Nona Dell, PA-C  cephALEXin (KEFLEX) 500 MG capsule Take 1 capsule (500 mg total) by mouth 4 (four) times daily. Patient not taking: Reported on 02/23/2015 02/02/15   Glendell Docker, NP  ibuprofen (ADVIL,MOTRIN) 800 MG tablet Take 1 tablet (800 mg total) by mouth 3 (three) times daily. Patient not taking: Reported on 07/13/2015 07/05/15   Gloriann Loan, PA-C  lidocaine (XYLOCAINE) 2 % solution Use as directed 20 mLs in the mouth or throat as needed for mouth pain. Patient not taking: Reported  on 07/13/2015 07/05/15   Kayla Rose, PA-C  trimethoprim-polymyxin b (POLYTRIM) ophthalmic solution Place 1 drop into both eyes every 4 (four) hours. X 7 days Patient not taking: Reported on 07/13/2015 02/23/15   Emily West, PA-C   Liver Function Tests  Recent Labs Lab 07/08/15 1213 07/13/15 1506  AST 18 16  ALT 14 15  ALKPHOS 42 40  BILITOT 0.8 0.2*  PROT 6.7 7.6  ALBUMIN 3.3* 3.6    Recent Labs Lab 07/08/15 1213 07/13/15 1506  LIPASE 17 18   CBC  Recent Labs Lab 07/08/15 1213 07/13/15 1506 07/14/15 0343  WBC 8.9 7.0 6.6  HGB 12.6 13.0 11.6*  HCT 37.3 39.5 35.1*  MCV 85.6 87.0 86.5  PLT 286 337 298   Basic Metabolic Panel  Recent Labs Lab 07/08/15 1213 07/13/15 1506 07/14/15 0343  NA 142 141 139  K 4.0 4.8 4.6  CL 106 105 108  CO2 26  27 24  GLUCOSE 100* 86 102*  BUN 13 19 17  CREATININE 1.17* 2.33* 1.84*  CALCIUM 9.0 9.2 8.5*    Filed Vitals:   07/13/15 1827 07/13/15 2056 07/14/15 0658 07/14/15 1322  BP: 123/83 135/73 110/67 113/64  Pulse: 69 82 78 78  Temp: 97.5 F (36.4 C) 99 F (37.2 C) 98.7 F (37.1 C) 98.6 F (37 C)  TempSrc: Oral Oral Oral Oral  Resp:  18 18 16  Height: 5' 6" (1.676 m)     Weight: 82.101 kg (181 lb)     SpO2: 100% 100% 100% 100%   Exam Alert no distress No rash, cyanosis or gangrene Sclera anicteric, throat clear No jvd Chest clear bilat RRR no MRG ABd soft ntnd no mass or ascites no hsm GU deferred MS no joint effusions; mild bilat HS in axillae Ext no LE or UE edema Neuro alert no distress nf  UA > negative protein, 0-5 wbc/ rbc/ epis, 1.006, pH 6.5, clear urine US Renal > kidneys 12-13 cm bilat and no hydro, mass, normal appearing echo  Home meds: Implanon, norco, zofran, Keflex, Advil, lidocaine 2 % soln   Assessment: 1 Acute renal failure - improving, nonoliguric.  In setting of heavy NSAID use related to HS and headaches.  Normal UA, no signs of GN.  Normal renal US.  Suspect she will improve off of nsaid's and with IVF's.  Will get ANA and C3/ C4 at this time. F/U creat in am.  Avoid nsaids, use Tylenol for pain.   2 Hidradenitis suppurativa 3 Headaches   Plan - as above  Rob Schertz MD [facility] pager [phone]    cell [phone] 07/14/2015, 4:56 PM    

## 2015-07-14 NOTE — Progress Notes (Signed)
PROGRESS NOTE  April Crane QMV:[id number] DOB: 08/02/1990 DOA: 07/13/2015 PCP: Billee CashingMCKENZIE, WAYLAND, MD Brief History 25 year old female with no chronic medical problems presented with 2 day worsening of abdominal pain and back pain with nausea and generalized weakness. The patient has been having abdominal pain and back pain for the greater part of one week. On 07/05/2015, the patient had an ED visit where she was diagnosed with pharyngitis and given Decadron and ibuprofen. Throat cultures were negative for group A streptococcus. The patient visited the ED again on 07/08/2015 because of abdominal pain with vomiting. CT of the abdomen and pelvis with intravenous contrast was performed at that time that showed a striated nephrographic appearance of her kidneys, but urinalysis did not show significant pyuria. It did show 100 mg/dL of protein. The patient was sent home in stable condition. For abdominal pain, the patient had been taking ibuprofen daily until she was told to stop on her ED visit 07/08/2015. She represented on 07/13/2015 because of abdominal pain. She was noted to have acute kidney injury with serum creatinine of 2.33. Admission was advised for further workup. There is no family history of renal disease.   Assessment/Plan: Acute kidney injury  -Suspect contrast nephropathy in the setting of recent NSAID use  -Continue IV fluids  -Renal ultrasound negative for hydronephrosis  -Nephrology has been consulted  -07/13/2015 urinalysis appears bland  -Avoid nephrotoxins  Abdominal pain  -Resolved  -Advance diet    Family Communication:   No family at beside Disposition Plan:   Home in 1-2 days pending nephrology input      Procedures/Studies: Ct Abdomen Pelvis W Contrast  07/08/2015  CLINICAL DATA:  Constant aching and occasionally sharp periumbilical pain and back pain EXAM: CT ABDOMEN AND PELVIS WITH CONTRAST TECHNIQUE: Multidetector CT imaging of the abdomen and pelvis  was performed using the standard protocol following bolus administration of intravenous contrast. CONTRAST:  100mL OMNIPAQUE IOHEXOL 300 MG/ML  SOLN COMPARISON:  11/10/2011 FINDINGS: Lower chest:  The lung bases are clear.  No pleural effusion. Hepatobiliary: No suspicious liver abnormalities identified. The gallbladder appears normal. There is no biliary dilatation. Pancreas: Negative Spleen: The spleen appears normal. Adrenals/Urinary Tract: The adrenal glands are normal. There is a suggestion of striated nephrographic appearance of the kidneys. No hydronephrosis. The urinary bladder appears normal. Stomach/Bowel: The stomach is within normal limits. The small bowel loops have a normal course and caliber. No obstruction. Normal appearance of the colon. The appendix is visualized and appears normal. Vascular/Lymphatic: Normal appearance of the abdominal aorta. No enlarged retroperitoneal or mesenteric adenopathy. No enlarged pelvic or inguinal lymph nodes. Reproductive: The uterus and adnexal structures appear normal. Other: There is no ascites or focal fluid collections within the abdomen or pelvis. Musculoskeletal: No aggressive lytic or sclerotic bone lesions identified. IMPRESSION: 1. There is a suggestion of a striated nephrograms bilaterally which may be seen with pyelonephritis. Correlation for any clinical signs or symptoms of urinary tract infection recommended. 2. Remainder of the examination is unremarkable. Electronically Signed   By: Signa Kellaylor  Stroud M.D.   On: 07/08/2015 15:14   [place] Renal  07/13/2015  CLINICAL DATA:  Acute kidney injury EXAM: RENAL / URINARY TRACT ULTRASOUND COMPLETE COMPARISON:  07/08/2015 FINDINGS: Right Kidney: Length: 12.5 cm. Echogenicity within normal limits. No mass or hydronephrosis visualized. Left Kidney: Length: 12.2 cm. Echogenicity within normal limits. No mass or hydronephrosis visualized. Bladder: Appears normal for degree of bladder distention. IMPRESSION: 1.  Normal  exam.  No obstructive uropathy identified. Electronically Signed   By: Signa Kell M.D.   On: 07/13/2015 18:57         Subjective: Patient denies fevers, chills, headache, chest pain, dyspnea, nausea, vomiting, diarrhea, abdominal pain, dysuria, hematuria   Objective: Filed Vitals:   07/13/15 1815 07/13/15 1827 07/13/15 2056 07/14/15 0658  BP: 116/83 123/83 135/73 110/67  Pulse: 71 69 82 78  Temp: 98.2 F (36.8 C) 97.5 F (36.4 C) 99 F (37.2 C) 98.7 F (37.1 C)  TempSrc: Oral Oral Oral Oral  Resp: Height:   (1.676 m)    Weight:  82.101 kg (181 lb)    SpO2: 100% 100% 100% 100%    Intake/Output Summary (Last 24 hours) at 07/14/15 1059 Last data filed at 07/14/15 0923  Gross per 24 hour  Intake 1416.7 ml  Output      0 ml  Net 1416.7 ml   Weight change:  Exam:   General:  Pt is alert, follows commands appropriately, not in acute distress  HEENT: No icterus, No thrush, No neck mass, [place]/AT  Cardiovascular: RRR, S1/S2, no rubs, no gallops  Respiratory: CTA bilaterally, no wheezing, no crackles, no rhonchi  Abdomen: Soft/+BS, non tender, non distended, no guarding  Extremities: No edema, No lymphangitis, No petechiae, No rashes, no synovitis  Data Reviewed: Basic Metabolic Panel:  Recent Labs Lab 07/08/15 1213 07/13/15 1506 07/14/15 0343  NA 142 141 139  K 4.0 4.8 4.6  CL 106 105 108  CO2 GLUCOSE 100* 86 102*  BUN CREATININE 1.17* 2.33* 1.84*  CALCIUM 9.0 9.2 8.5*   Liver Function Tests:  Recent Labs Lab 07/08/15 1213 07/13/15 1506  AST 18 16  ALT 14 15  ALKPHOS 42 40  BILITOT 0.8 0.2*  PROT 6.7 7.6  ALBUMIN 3.3* 3.6    Recent Labs Lab 07/08/15 1213 07/13/15 1506  LIPASE 17 18   No results for input(s): AMMONIA in the last 168 hours. CBC:  Recent Labs Lab 07/08/15 1213 07/13/15 1506 07/14/15 0343  WBC 8.9 7.0 6.6  HGB 12.6 13.0 11.6*  HCT 37.3 39.5 35.1*  MCV 85.6 87.0 86.5  PLT 286  337 298   Cardiac Enzymes: No results for input(s): CKTOTAL, CKMB, CKMBINDEX, TROPONINI in the last 168 hours. BNP: Invalid input(s): POCBNP CBG: No results for input(s): GLUCAP in the last 168 hours.  Recent Results (from the past 240 hour(s))  Rapid strep screen     Status: None   Collection Time: 07/05/15  9:02 AM  Result Value Ref Range Status   Streptococcus, Group A Screen (Direct) NEGATIVE NEGATIVE Final    Comment: (NOTE) A Rapid Antigen test may result negative if the antigen level in the sample is below the detection level of this test. The FDA has not cleared this test as a stand-alone test therefore the rapid antigen negative result has reflexed to a Group A Strep culture.   Culture, group A strep     Status: None   Collection Time: 07/05/15  9:02 AM  Result Value Ref Range Status   Specimen Description THROAT  Final   Special Requests NONE Reflexed from [id number]  Final   Culture NO GROUP A STREP (S.PYOGENES) ISOLATED  Final   Report Status 07/07/2015 FINAL  Final  MRSA PCR Screening     Status: None   Collection Time: 07/13/15  6:50 PM  Result Value  Ref Range Status   MRSA by PCR NEGATIVE NEGATIVE Final    Comment:        The GeneXpert MRSA Assay (FDA approved for NASAL specimens only), is one component of a comprehensive MRSA colonization surveillance program. It is not intended to diagnose MRSA infection nor to guide or monitor treatment for MRSA infections.      Scheduled Meds: . enoxaparin (LOVENOX) injection  40 mg Subcutaneous Q24H   Continuous Infusions: . sodium chloride 100 mL/hr at 07/13/15 1844     Shernita Rabinovich, DO  [facility] Pager [phone]  If 7PM-7AM, please contact night-coverage [email or web address] Password [email or web address] 07/14/2015, 10:59 AM   LOS: 1 day

## 2015-07-14 NOTE — Progress Notes (Signed)
Patient c/o chest discomfort. States location is in center of her chest and reports this occasionally happens at home with no obvious triggers and subsides without any interventions.  MD notified and new orders placed.

## 2015-07-15 DIAGNOSIS — R079 Chest pain, unspecified: Secondary | ICD-10-CM | POA: Insufficient documentation

## 2015-07-15 DIAGNOSIS — R0789 Other chest pain: Secondary | ICD-10-CM

## 2015-07-15 LAB — RENAL FUNCTION PANEL
ANION GAP: 8 (ref 5–15)
Albumin: 3.3 g/dL — ABNORMAL LOW (ref 3.5–5.0)
BUN: 14 mg/dL (ref 6–20)
CALCIUM: 8.7 mg/dL — AB (ref 8.9–10.3)
CHLORIDE: 108 mmol/L (ref 101–111)
CO2: 24 mmol/L (ref 22–32)
Creatinine, Ser: 1.36 mg/dL — ABNORMAL HIGH (ref 0.44–1.00)
GFR calc non Af Amer: 54 mL/min — ABNORMAL LOW (ref >60)
Glucose, Bld: 147 mg/dL — ABNORMAL HIGH (ref 65–99)
POTASSIUM: 4.3 mmol/L (ref 3.5–5.1)
Phosphorus: 3.7 mg/dL (ref 2.5–4.6)
Sodium: 140 mmol/L (ref 135–145)

## 2015-07-15 LAB — TROPONIN I: Troponin I: <0.03 ng/mL (ref <0.031)

## 2015-07-15 NOTE — Discharge Summary (Signed)
Physician Discharge Summary  Craig StaggersKeisha L Arscott [id number] DOB: 1990-10-23 DOA: 07/13/2015  PCP: Billee CashingMCKENZIE, WAYLAND, MD  Admit date: 07/13/2015 Discharge date: 07/15/2015  Recommendations for Outpatient Follow-up:  1. Pt will need to follow up with PCP in 1 week post discharge 2. Please obtain BMP in one week 3. Please follow up on C3, C4, and ANA  Discharge Diagnoses:  -Suspect contrast nephropathy in the setting of NSAID use  -Continue IV fluids  -Renal ultrasound negative for hydronephrosis  -Nephrology has been consulted--ordered C3, C4, ANA -07/13/2015 urinalysis appears bland  -Avoid nephrotoxins -serum creatinine 2.33 on day of admission -serum creatinine 1.3 on day of discharge -3/7--discussed with Dr. Vianne BullsShertz--ok to discharge to follow up with Dr. Ronne BinningMckenzie within 1 week for BMP  Abdominal pain  -Resolved  -Advance diet --tolerated ok Atypical Chest Pain -troponins neg -EKG without concerning ischemic changes  Discharge Condition: stable  Disposition: home  Diet:heart healthy Wt Readings from Last 3 Encounters:  07/13/15 82.101 kg (181 lb)  07/08/15 82.101 kg (181 lb)  07/05/15 82.3 kg (181 lb 7 oz)    History of present illness:  25 year old female with no chronic medical problems presented with 2 day worsening of abdominal pain and back pain with nausea and generalized weakness. The patient has been having abdominal pain and back pain for the greater part of one week. On 07/05/2015, the patient had an ED visit where she was diagnosed with pharyngitis and given Decadron and ibuprofen. Throat cultures were negative for group A streptococcus. The patient visited the ED again on 07/08/2015 because of abdominal pain with vomiting. CT of the abdomen and pelvis with intravenous contrast was performed at that time that showed a striated nephrographic appearance of her kidneys, but urinalysis did not show significant pyuria. It did show 100 mg/dL of protein. The patient  was sent home in stable condition. For abdominal pain, the patient had been taking ibuprofen daily until she was told to stop on her ED visit 07/08/2015. She represented on 07/13/2015 because of abdominal pain. She was noted to have acute kidney injury with serum creatinine of 2.33. Admission was advised for further workup. There is no family history of renal disease.   Consultants: Nephrology--Shertz  Discharge Exam: Filed Vitals:   07/15/15 0656 07/15/15 1430  BP: 113/60 116/74  Pulse: 82 80  Temp: 98.7 F (37.1 C) 98.9 F (37.2 C)  Resp: 20 20   Filed Vitals:   07/14/15 1322 07/14/15 2355 07/15/15 0656 07/15/15 1430  BP: 113/64 104/64 113/60 116/74  Pulse: 78 62 82 80  Temp: 98.6 F (37 C) 98.8 F (37.1 C) 98.7 F (37.1 C) 98.9 F (37.2 C)  TempSrc: Oral Oral Oral Oral  Resp: 16 16 20 20   Height:      Weight:      SpO2: 100% 100% 100% 100%   General: A&O x 3, NAD, pleasant, cooperative Cardiovascular: RRR, no rub, no gallop, no S3 Respiratory: CTAB, no wheeze, no rhonchi Abdomen:soft, nontender, nondistended, positive bowel sounds Extremities: No edema, No lymphangitis, no petechiae  Discharge Instructions  Discharge Instructions    Diet - low sodium heart healthy    Complete by:  As directed      Increase activity slowly    Complete by:  As directed             Medication List    STOP taking these medications        cephALEXin 500 MG capsule  Commonly known as:  KEFLEX  ibuprofen 800 MG tablet  Commonly known as:  ADVIL,MOTRIN     lidocaine 2 % solution  Commonly known as:  XYLOCAINE     trimethoprim-polymyxin b ophthalmic solution  Commonly known as:  POLYTRIM      TAKE these medications        HYDROcodone-acetaminophen 5-325 MG tablet  Commonly known as:  NORCO/VICODIN  Take 1 tablet by mouth every 4 (four) hours as needed for moderate pain.     IMPLANON 68 MG Impl implant  Generic drug:  etonogestrel  Inject 1 each into the skin once.  Place 07/2014     ondansetron 4 MG disintegrating tablet  Commonly known as:  ZOFRAN ODT  Take 1 tablet (4 mg total) by mouth every 8 (eight) hours as needed for nausea or vomiting.         The results of significant diagnostics from this hospitalization (including imaging, microbiology, ancillary and laboratory) are listed below for reference.    Significant Diagnostic Studies: Dg Chest 2 View  07/14/2015  CLINICAL DATA:  Intermittent mid chest pain. EXAM: CHEST - 2 VIEW COMPARISON:  Two-view chest x-ray 11/13/2013. FINDINGS: The heart size and mediastinal contours are within normal limits. Both lungs are clear. The visualized skeletal structures are unremarkable. IMPRESSION: Negative two view chest x-ray Electronically Signed   By: Marin Roberts M.D.   On: 07/14/2015 16:06   Ct Abdomen Pelvis W Contrast  07/08/2015  CLINICAL DATA:  Constant aching and occasionally sharp periumbilical pain and back pain EXAM: CT ABDOMEN AND PELVIS WITH CONTRAST TECHNIQUE: Multidetector CT imaging of the abdomen and pelvis was performed using the standard protocol following bolus administration of intravenous contrast. CONTRAST:  OMNIPAQUE IOHEXOL 300 MG/ML  SOLN COMPARISON:  11/10/2011 FINDINGS: Lower chest:  The lung bases are clear.  No pleural effusion. Hepatobiliary: No suspicious liver abnormalities identified. The gallbladder appears normal. There is no biliary dilatation. Pancreas: Negative Spleen: The spleen appears normal. Adrenals/Urinary Tract: The adrenal glands are normal. There is a suggestion of striated nephrographic appearance of the kidneys. No hydronephrosis. The urinary bladder appears normal. Stomach/Bowel: The stomach is within normal limits. The small bowel loops have a normal course and caliber. No obstruction. Normal appearance of the colon. The appendix is visualized and appears normal. Vascular/Lymphatic: Normal appearance of the abdominal aorta. No enlarged retroperitoneal or  mesenteric adenopathy. No enlarged pelvic or inguinal lymph nodes. Reproductive: The uterus and adnexal structures appear normal. Other: There is no ascites or focal fluid collections within the abdomen or pelvis. Musculoskeletal: No aggressive lytic or sclerotic bone lesions identified. IMPRESSION: 1. There is a suggestion of a striated nephrograms bilaterally which may be seen with pyelonephritis. Correlation for any clinical signs or symptoms of urinary tract infection recommended. 2. Remainder of the examination is unremarkable. Electronically Signed   By: Signa Kell M.D.   On: 07/08/2015 15:14   US Renal  07/13/2015  CLINICAL DATA:  Acute kidney injury EXAM: RENAL / URINARY TRACT ULTRASOUND COMPLETE COMPARISON:  07/08/2015 FINDINGS: Right Kidney: Length: 12.5 cm. Echogenicity within normal limits. No mass or hydronephrosis visualized. Left Kidney: Length: 12.2 cm. Echogenicity within normal limits. No mass or hydronephrosis visualized. Bladder: Appears normal for degree of bladder distention. IMPRESSION: 1. Normal exam.  No obstructive uropathy identified. Electronically Signed   By: Signa Kell M.D.   On: 07/13/2015 18:57     Microbiology: Recent Results (from the past 240 hour(s))  MRSA PCR Screening     Status: None  Collection Time: 07/13/15  6:50 PM  Result Value Ref Range Status   MRSA by PCR NEGATIVE NEGATIVE Final    Comment:        The GeneXpert MRSA Assay (FDA approved for NASAL specimens only), is one component of a comprehensive MRSA colonization surveillance program. It is not intended to diagnose MRSA infection nor to guide or monitor treatment for MRSA infections.      Labs: Basic Metabolic Panel:  Recent Labs Lab 07/13/15 1506 07/14/15 0343 07/15/15 0315  NA 141 139 140  K 4.8 4.6 4.3  CL 105 108 108  CO2 GLUCOSE 86 102* 147*  BUN CREATININE 2.33* 1.84* 1.36*  CALCIUM 9.2 8.5* 8.7*  PHOS  --   --  3.7   Liver Function  Tests:  Recent Labs Lab 07/13/15 1506 07/15/15 0315  AST 16  --   ALT 15  --   ALKPHOS 40  --   BILITOT 0.2*  --   PROT 7.6  --   ALBUMIN 3.6 3.3*    Recent Labs Lab 07/13/15 1506  LIPASE 18   No results for input(s): AMMONIA in the last 168 hours. CBC:  Recent Labs Lab 07/13/15 1506 07/14/15 0343  WBC 7.0 6.6  HGB 13.0 11.6*  HCT 39.5 35.1*  MCV 87.0 86.5  PLT 337 298   Cardiac Enzymes:  Recent Labs Lab 07/14/15 1532 07/14/15 2225 07/15/15 0315  TROPONINI <0.03 <0.03 <0.03   BNP: Invalid input(s): POCBNP CBG: No results for input(s): GLUCAP in the last 168 hours.  Time coordinating discharge:  Greater than 30 minutes  Signed:  Ellison Leisure, DO [facility] Pager: [phone] 07/15/2015, 10:29 PM

## 2015-07-15 NOTE — Progress Notes (Signed)
Patient discharging home.  Her sister is here to pick up in personal vehicle.  Discharge instructions reviewed with patient, she verbalized understanding.  Patient has no questions for nurse at this time.   IV in right AC removed, catheter intact.  Patient taken via wheel chair to meet sister at entrance.

## 2015-07-15 NOTE — Care Management Note (Signed)
Case Management Note  Patient Details  Name: April StaggersKeisha L Lamoreaux MRN: [id number] Date of Birth: [birth year]  Subjective/Objective:     25 yo admitted with AKI               Action/Plan: From home with parent  Expected Discharge Date:  07/17/15               Expected Discharge Plan:  Home/Self Care  In-House Referral:     Discharge planning Services  CM Consult  Post Acute Care Choice:    Choice offered to:     DME Arranged:    DME Agency:     HH Arranged:    HH Agency:     Status of Service:  In process, will continue to follow  Medicare Important Message Given:    Date Medicare IM Given:    Medicare IM give by:    Date Additional Medicare IM Given:    Additional Medicare Important Message give by:     If discussed at Long Length of Stay Meetings, dates discussed:    Additional Comments: Chart reviewed and no CM needs identified or communicated at this time. CM will continue to follow. Sandford Crazeora Shamere Campas RN,BSN,NCM [phone] BillLEMENTS, Elizabeth Paulsen H, RN 07/15/2015, 2:10 PM

## 2015-07-17 LAB — C3 COMPLEMENT: C3 Complement: 162 mg/dL (ref 82–167)

## 2015-07-17 LAB — C4 COMPLEMENT: COMPLEMENT C4, BODY FLUID: 36 mg/dL (ref 14–44)

## 2015-07-17 LAB — ANTINUCLEAR ANTIBODIES, IFA: ANTINUCLEAR ANTIBODIES, IFA: NEGATIVE

## 2015-07-18 ENCOUNTER — Encounter (HOSPITAL_COMMUNITY): Payer: Self-pay | Admitting: Emergency Medicine

## 2015-07-18 ENCOUNTER — Emergency Department (HOSPITAL_COMMUNITY)
Admission: EM | Admit: 2015-07-18 | Discharge: 2015-07-18 | Disposition: A | Discharge status: Home / Self Care | Payer: BLUE CROSS/BLUE SHIELD | Attending: Emergency Medicine | Admitting: Emergency Medicine

## 2015-07-18 DIAGNOSIS — R109 Unspecified abdominal pain: Secondary | ICD-10-CM | POA: Diagnosis present

## 2015-07-18 DIAGNOSIS — K2970 Gastritis, unspecified, without bleeding: Secondary | ICD-10-CM | POA: Diagnosis not present

## 2015-07-18 LAB — URINALYSIS, ROUTINE W REFLEX MICROSCOPIC
Bilirubin Urine: NEGATIVE
Glucose, UA: NEGATIVE mg/dL
Ketones, ur: NEGATIVE mg/dL
Nitrite: NEGATIVE
PROTEIN: NEGATIVE mg/dL
Specific Gravity, Urine: 1.016 (ref 1.005–1.030)
pH: 6 (ref 5.0–8.0)

## 2015-07-18 LAB — COMPREHENSIVE METABOLIC PANEL
ALBUMIN: 4.4 g/dL (ref 3.5–5.0)
ALK PHOS: 44 U/L (ref 38–126)
ALT: 39 U/L (ref 14–54)
AST: 25 U/L (ref 15–41)
Anion gap: 10 (ref 5–15)
BUN: 13 mg/dL (ref 6–20)
CALCIUM: 9.6 mg/dL (ref 8.9–10.3)
CHLORIDE: 104 mmol/L (ref 101–111)
CO2: 28 mmol/L (ref 22–32)
CREATININE: 0.98 mg/dL (ref 0.44–1.00)
GFR calc non Af Amer: >60 mL/min (ref >60)
Glucose, Bld: 106 mg/dL — ABNORMAL HIGH (ref 65–99)
Potassium: 4.1 mmol/L (ref 3.5–5.1)
SODIUM: 142 mmol/L (ref 135–145)
Total Bilirubin: 1 mg/dL (ref 0.3–1.2)
Total Protein: 8.8 g/dL — ABNORMAL HIGH (ref 6.5–8.1)

## 2015-07-18 LAB — CBC
HCT: 43.2 % (ref 36.0–46.0)
Hemoglobin: 14.3 g/dL (ref 12.0–15.0)
MCH: 28.9 pg (ref 26.0–34.0)
MCHC: 33.1 g/dL (ref 30.0–36.0)
MCV: 87.3 fL (ref 78.0–100.0)
PLATELETS: 325 10*3/uL (ref 150–400)
RBC: 4.95 MIL/uL (ref 3.87–5.11)
RDW: 13.2 % (ref 11.5–15.5)
WBC: 10.2 10*3/uL (ref 4.0–10.5)

## 2015-07-18 LAB — URINE MICROSCOPIC-ADD ON

## 2015-07-18 LAB — LIPASE, BLOOD: LIPASE: 21 U/L (ref 11–51)

## 2015-07-18 MED ORDER — ONDANSETRON 4 MG PO TBDP
4.0000 mg | ORAL_TABLET | Freq: Once | ORAL | Status: AC
  Administered 2015-07-18: 4 mg via ORAL
  Filled 2015-07-18: qty 1

## 2015-07-18 MED ORDER — FAMOTIDINE 20 MG PO TABS
20.0000 mg | ORAL_TABLET | Freq: Once | ORAL | Status: AC
  Administered 2015-07-18: 20 mg via ORAL
  Filled 2015-07-18: qty 1

## 2015-07-18 MED ORDER — ONDANSETRON 4 MG PO TBDP: 4.0000 mg | ORAL_TABLET | ORAL | Status: DC | PRN

## 2015-07-18 MED ORDER — FAMOTIDINE 20 MG PO TABS: 20.0000 mg | ORAL_TABLET | Freq: Two times a day (BID) | ORAL | Status: DC

## 2015-07-18 MED ORDER — ONDANSETRON 4 MG PO TBDP
4.0000 mg | ORAL_TABLET | Freq: Once | ORAL | Status: AC | PRN
  Administered 2015-07-18: 4 mg via ORAL
  Filled 2015-07-18: qty 1

## 2015-07-18 NOTE — ED Provider Notes (Signed)
CSN: [id number]     Arrival date & time 07/18/15  1242 History   First MD Initiated Contact with Patient 07/18/15 1823     Chief Complaint  Patient presents with  . Abdominal Pain     (Consider location/radiation/quality/duration/timing/severity/associated sxs/prior Treatment) HPI Patient was discharged from the hospital 2 days ago. She reports on the day of discharge she felt fine. She had been hospitalized for abdominal pain and renal insufficiency that was thought due to a combination of IV contrast dye and ibuprofen. Yesterday she ate chicken and macaroni and cheese. She reports today she ate cereal. Today she developed cramping abdominal pain that she identifies as being predominantly central and epigastric. She reports she vomited 2 times. She states she had normal bowel movement yesterday. No diarrhea or constipation. No urinary symptoms of pain burning or urgency. No fevers or chills. Past Medical History  Diagnosis Date  . No pertinent past medical history   . Obstruction of colon [facility])    Past Surgical History  Procedure Laterality Date  . Anterior cruciate ligament repair    . Anterior cruciate ligament repair     Family History  Problem Relation Age of Onset  . Asthma Sister   . Cancer Maternal Grandmother    Social History  Substance Use Topics  . Smoking status: Never Smoker   . Smokeless tobacco: None  . Alcohol Use: No   OB History    Gravida Para Term Preterm AB TAB SAB Ectopic Multiple Living   0 0             Review of Systems 10 Systems reviewed and are negative for acute change except as noted in the HPI.    Allergies  Other  Home Medications   Prior to Admission medications   Medication Sig Start Date End Date Taking? Authorizing Provider  etonogestrel (IMPLANON) 68 MG IMPL implant Inject 1 each into the skin once. Place 07/2014   Yes Historical Provider, MD  HYDROcodone-acetaminophen (NORCO/VICODIN) 5-325 MG tablet Take 1 tablet by mouth every  4 (four) hours as needed for moderate pain.  07/10/15  Yes Historical Provider, MD  ondansetron (ZOFRAN ODT) 4 MG disintegrating tablet Take 1 tablet (4 mg total) by mouth every 8 (eight) hours as needed for nausea or vomiting. 07/08/15  Yes Barrett Henle, PA-C  famotidine (PEPCID) 20 MG tablet Take 1 tablet (20 mg total) by mouth 2 (two) times daily. 07/18/15   Arby Barrette, MD  ondansetron (ZOFRAN ODT) 4 MG disintegrating tablet Take 1 tablet (4 mg total) by mouth every 4 (four) hours as needed for nausea or vomiting. 07/18/15   Arby Barrette, MD   BP 148/100 mmHg  Pulse 81  Temp(Src) 97.4 F (36.3 C) (Oral)  Resp 18  SpO2 100% Physical Exam  Constitutional: She is oriented to person, place, and time. She appears well-developed and well-nourished.  HENT:  Head: Normocephalic and atraumatic.  Eyes: EOM are normal. Pupils are equal, round, and reactive to light.  Neck: Neck supple.  Cardiovascular: Normal rate, regular rhythm, normal heart sounds and intact distal pulses.   Pulmonary/Chest: Effort normal and breath sounds normal.  Abdominal: Soft. Bowel sounds are normal. She exhibits no distension. There is tenderness.  Moderate discomfort to palpation in the epigastrium and right upper quadrant. No guarding. Lower abdomen is nontender.  Musculoskeletal: Normal range of motion. She exhibits no edema or tenderness.  Neurological: She is alert and oriented to person, place, and time. She has normal strength.  Coordination normal. GCS eye subscore is 4. GCS verbal subscore is 5. GCS motor subscore is 6.  Skin: Skin is warm, dry and intact.  Psychiatric: She has a normal mood and affect.    ED Course  Procedures (including critical care time) Labs Review Labs Reviewed  COMPREHENSIVE METABOLIC PANEL - Abnormal; Notable for the following:    Glucose, Bld 106 (*)    Total Protein 8.8 (*)    All other components within normal limits  URINALYSIS, ROUTINE W REFLEX MICROSCOPIC (NOT AT  [facility]) - Abnormal; Notable for the following:    APPearance CLOUDY (*)    Hgb urine dipstick TRACE (*)    Leukocytes, UA SMALL (*)    All other components within normal limits  URINE MICROSCOPIC-ADD ON - Abnormal; Notable for the following:    Squamous Epithelial / LPF 6-30 (*)    Bacteria, UA MANY (*)    All other components within normal limits  LIPASE, BLOOD  CBC    Imaging Review No results found. I have personally reviewed and evaluated these images and lab results as part of my medical decision-making.   EKG Interpretation None      MDM   Final diagnoses:  Gastritis   Patient's diagnostic workup is normal. No leukocytosis or anemia. Renal function is normal. Patient has some abdominal discomfort but no guarding or signs of surgical abdomen. At this time suspicion is for gastritis. Patient will be given Pepcid to take as well as Zofran. She is also counseled on a gallbladder diet for possible delayed biliary emptying. CT scan did not show cholecystitis or stones, patient's symptoms however are suggestive of biliary colic thus the diet is recommended with family physician follow-up.    Arby BarretteMarcy Nieves Chapa, MD 07/18/15 2038

## 2015-07-18 NOTE — ED Notes (Signed)
Pt states that she has been having mid abd pain since this morning.  Was recently d/c from 3rd floor on 3/5.  When asked why she was here, pt states, "kidneys".  Pt endorses vomiting.

## 2015-07-18 NOTE — Discharge Instructions (Signed)
Suspected Gastritis, Adult Gastritis is soreness and swelling (inflammation) of the lining of the stomach. Gastritis can develop as a sudden onset (acute) or long-term (chronic) condition. If gastritis is not treated, it can lead to stomach bleeding and ulcers. CAUSES  Gastritis occurs when the stomach lining is weak or damaged. Digestive juices from the stomach then inflame the weakened stomach lining. The stomach lining may be weak or damaged due to viral or bacterial infections. One common bacterial infection is the Helicobacter pylori infection. Gastritis can also result from excessive alcohol consumption, taking certain medicines, or having too much acid in the stomach.  SYMPTOMS  In some cases, there are no symptoms. When symptoms are present, they may include:  Pain or a burning sensation in the upper abdomen.  Nausea.  Vomiting.  An uncomfortable feeling of fullness after eating. DIAGNOSIS  Your caregiver may suspect you have gastritis based on your symptoms and a physical exam. To determine the cause of your gastritis, your caregiver may perform the following:  Blood or stool tests to check for the H pylori bacterium.  Gastroscopy. A thin, flexible tube (endoscope) is passed down the esophagus and into the stomach. The endoscope has a light and camera on the end. Your caregiver uses the endoscope to view the inside of the stomach.  Taking a tissue sample (biopsy) from the stomach to examine under a microscope. TREATMENT  Depending on the cause of your gastritis, medicines may be prescribed. If you have a bacterial infection, such as an H pylori infection, antibiotics may be given. If your gastritis is caused by too much acid in the stomach, H2 blockers or antacids may be given. Your caregiver may recommend that you stop taking aspirin, ibuprofen, or other nonsteroidal anti-inflammatory drugs (NSAIDs). HOME CARE INSTRUCTIONS  Only take over-the-counter or prescription medicines as  directed by your caregiver.  If you were given antibiotic medicines, take them as directed. Finish them even if you start to feel better.  Drink enough fluids to keep your urine clear or pale yellow.  Avoid foods and drinks that make your symptoms worse, such as:  Caffeine or alcoholic drinks.  Chocolate.  Peppermint or mint flavorings.  Garlic and onions.  Spicy foods.  Citrus fruits, such as oranges, lemons, or limes.  Tomato-based foods such as sauce, chili, salsa, and pizza.  Fried and fatty foods.  Eat small, frequent meals instead of large meals. SEEK IMMEDIATE MEDICAL CARE IF:   You have black or dark red stools.  You vomit blood or material that looks like coffee grounds.  You are unable to keep fluids down.  Your abdominal pain gets worse.  You have a fever.  You do not feel better after 1 week.  You have any other questions or concerns. MAKE SURE YOU:  Understand these instructions.  Will watch your condition.  Will get help right away if you are not doing well or get worse.   This information is not intended to replace advice given to you by your health care provider. Make sure you discuss any questions you have with your health care provider.   Document Released: 04/20/2001 Document Revised: 10/26/2011 Document Reviewed: 06/09/2011 Elsevier Interactive Patient Education 2016 [employer] POSSIBLE Biliary Colic EAT A NON FAT DIET UNTIL YOU HAVE FOLLOWED UP WITH YOUR FAMILY DOCTOR FOR RECHECK. SOMETIMES, THE GALLBLADDER CAN BE SLOW TO EMPTY AND REQUIRE A TEST CALLED A HIDA SCAN TO IDENTIFY THAT PROBLEM. IF YOUR SYMPTOMS ARE NOT IMPROVING WITH THE PRESCRIBED MEDICATION,  YOUR DOCTOR MIGHT CONSIDER SCHEDULING THIS TEST FOR YOU. tHIS IS NOT A TEST DONE THROUGH THE EMERGENCY DEPARTMENT. Biliary colic is a pain in the upper abdomen. The pain:  Is usually felt on the right side of the abdomen, but it may also be felt in the center of the abdomen, just  below the breastbone (sternum).  May spread back toward the right shoulder blade.  May be steady or irregular.  May be accompanied by nausea and vomiting. Most of the time, the pain goes away in 1-5 hours. After the most intense pain passes, the abdomen may continue to ache mildly for about 24 hours. Biliary colic is caused by a blockage in the bile duct. The bile duct is a pathway that carries bile--a liquid that helps to digest fats--from the gallbladder to the small intestine. Biliary colic usually occurs after eating, when the digestive system demands bile. The pain develops when muscle cells contract forcefully to try to move the blockage so that bile can get by. HOME CARE INSTRUCTIONS  Take medicines only as directed by your health care provider.  Drink enough fluid to keep your urine clear or pale yellow.  Avoid fatty, greasy, and fried foods. These kinds of foods increase your body's demand for bile.  Avoid any foods that make your pain worse.  Avoid overeating.  Avoid having a large meal after fasting. SEEK MEDICAL CARE IF:  You develop a fever.  Your pain gets worse.  You vomit.  You develop nausea that prevents you from eating and drinking. SEEK IMMEDIATE MEDICAL CARE IF:  You suddenly develop a fever and shaking chills.  You develop a yellowish discoloration (jaundice) of:  Skin.  Whites of the eyes.  Mucous membranes.  You have continuous or severe pain that is not relieved with medicines.  You have nausea and vomiting that is not relieved with medicines.  You develop dizziness or you faint.   This information is not intended to replace advice given to you by your health care provider. Make sure you discuss any questions you have with your health care provider.   Document Released: 09/27/2005 Document Revised: 09/10/2014 Document Reviewed: 02/05/2014 Elsevier Interactive Patient Education 2016 Else[employer] Inc[employer]  Low-Fat Diet for Pancreatitis or  Gallbladder Conditions A low-fat diet can be helpful if you have pancreatitis or a gallbladder condition. With these conditions, your pancreas and gallbladder have trouble digesting fats. A healthy eating plan with less fat will help rest your pancreas and gallbladder and reduce your symptoms. WHAT DO I NEED TO KNOW ABOUT THIS DIET?  Eat a low-fat diet.  Reduce your fat intake to less than 20-30% of your total daily calories. This is less than 50-60 g of fat per day.  Remember that you need some fat in your diet. Ask your dietician what your daily goal should be.  Choose nonfat and low-fat healthy foods. Look for the words "nonfat," "low fat," or "fat free."  As a guide, look on the label and choose foods with less than 3 g of fat per serving. Eat only one serving.  Avoid alcohol.  Do not smoke. If you need help quitting, talk with your health care provider.  Eat small frequent meals instead of three large heavy meals. WHAT FOODS CAN I EAT? Grains Include healthy grains and starches such as potatoes, wheat bread, fiber-rich cereal, and brown rice. Choose whole grain options whenever possible. In adults, whole grains should account for 45-65% of your daily calories.  Fruits and Vegetables  Eat plenty of fruits and vegetables. Fresh fruits and vegetables add fiber to your diet. Meats and Other Protein Sources Eat lean meat such as chicken and pork. Trim any fat off of meat before cooking it. Eggs, fish, and beans are other sources of protein. In adults, these foods should account for 10-35% of your daily calories. Dairy Choose low-fat milk and dairy options. Dairy includes fat and protein, as well as calcium.  Fats and Oils Limit high-fat foods such as fried foods, sweets, baked goods, sugary drinks.  Other Creamy sauces and condiments, such as mayonnaise, can add extra fat. Think about whether or not you need to use them, or use smaller amounts or low fat options. WHAT FOODS ARE NOT  RECOMMENDED?  High fat foods, such as:  [employer].  Ice cream.  [place] toast.  Sweet rolls.  Pizza.  Cheese bread.  Foods covered with batter, butter, creamy sauces, or cheese.  Fried foods.  Sugary drinks and desserts.  Foods that cause gas or bloating   This information is not intended to replace advice given to you by your health care provider. Make sure you discuss any questions you have with your health care provider.   Document Released: 05/01/2013 Document Reviewed: 05/01/2013 Elsevier Interactive Patient Education [employer]

## 2015-11-08 ENCOUNTER — Emergency Department (HOSPITAL_COMMUNITY)
Admission: EM | Admit: 2015-11-08 | Discharge: 2015-11-08 | Disposition: A | Discharge status: Home / Self Care | Payer: BLUE CROSS/BLUE SHIELD | Attending: Emergency Medicine | Admitting: Emergency Medicine

## 2015-11-08 ENCOUNTER — Encounter (HOSPITAL_COMMUNITY): Payer: Self-pay

## 2015-11-08 DIAGNOSIS — K5900 Constipation, unspecified: Secondary | ICD-10-CM | POA: Insufficient documentation

## 2015-11-08 DIAGNOSIS — R1084 Generalized abdominal pain: Secondary | ICD-10-CM

## 2015-11-08 DIAGNOSIS — R1030 Lower abdominal pain, unspecified: Secondary | ICD-10-CM | POA: Diagnosis present

## 2015-11-08 LAB — URINALYSIS, ROUTINE W REFLEX MICROSCOPIC
BILIRUBIN URINE: NEGATIVE
Glucose, UA: NEGATIVE mg/dL
HGB URINE DIPSTICK: NEGATIVE
KETONES UR: 15 mg/dL — AB
Nitrite: NEGATIVE
Protein, ur: NEGATIVE mg/dL
SPECIFIC GRAVITY, URINE: 1.028 (ref 1.005–1.030)
pH: 5.5 (ref 5.0–8.0)

## 2015-11-08 LAB — COMPREHENSIVE METABOLIC PANEL
ALK PHOS: 40 U/L (ref 38–126)
ALT: 12 U/L — ABNORMAL LOW (ref 14–54)
ANION GAP: 7 (ref 5–15)
AST: 15 U/L (ref 15–41)
Albumin: 3.6 g/dL (ref 3.5–5.0)
BILIRUBIN TOTAL: 1 mg/dL (ref 0.3–1.2)
BUN: 7 mg/dL (ref 6–20)
CALCIUM: 9.5 mg/dL (ref 8.9–10.3)
CO2: 25 mmol/L (ref 22–32)
CREATININE: 0.65 mg/dL (ref 0.44–1.00)
Chloride: 107 mmol/L (ref 101–111)
Glucose, Bld: 86 mg/dL (ref 65–99)
Potassium: 3.7 mmol/L (ref 3.5–5.1)
SODIUM: 139 mmol/L (ref 135–145)
TOTAL PROTEIN: 7.2 g/dL (ref 6.5–8.1)

## 2015-11-08 LAB — CBC
HCT: 36.6 % (ref 36.0–46.0)
HEMOGLOBIN: 12.1 g/dL (ref 12.0–15.0)
MCH: 29 pg (ref 26.0–34.0)
MCHC: 33.1 g/dL (ref 30.0–36.0)
MCV: 87.8 fL (ref 78.0–100.0)
Platelets: 307 10*3/uL (ref 150–400)
RBC: 4.17 MIL/uL (ref 3.87–5.11)
RDW: 13.1 % (ref 11.5–15.5)
WBC: 6.1 10*3/uL (ref 4.0–10.5)

## 2015-11-08 LAB — I-STAT BETA HCG BLOOD, ED (MC, WL, AP ONLY)

## 2015-11-08 LAB — WET PREP, GENITAL
CLUE CELLS WET PREP: NONE SEEN
SPERM: NONE SEEN
TRICH WET PREP: NONE SEEN
YEAST WET PREP: NONE SEEN

## 2015-11-08 LAB — LIPASE, BLOOD: Lipase: 15 U/L (ref 11–51)

## 2015-11-08 LAB — URINE MICROSCOPIC-ADD ON: RBC / HPF: NONE SEEN RBC/hpf (ref 0–5)

## 2015-11-08 MED ORDER — DICYCLOMINE HCL 10 MG PO CAPS: 10.0000 mg | ORAL_CAPSULE | Freq: Three times a day (TID) | ORAL | Status: DC | PRN

## 2015-11-08 MED ORDER — DICYCLOMINE HCL 10 MG PO CAPS: 10.0000 mg | ORAL_CAPSULE | Freq: Three times a day (TID) | ORAL | Status: DC

## 2015-11-08 NOTE — ED Notes (Signed)
Patient complains of lower abdominal cramping x 2 days, denies nausea, vomiting, diarrhea. No distress

## 2015-11-08 NOTE — Discharge Instructions (Signed)
Stay hydrated! Recommended miralax taper (4 caps in 1 32oz bottle day 1), senokot and colace for most aggressive therapy.  May try 1-2 caps of miralax per day without other medications for mild therapy.   Abdominal Pain, Adult Many things can cause abdominal pain. Usually, abdominal pain is not caused by a disease and will improve without treatment. It can often be observed and treated at home. Your health care provider will do a physical exam and possibly order blood tests and X-rays to help determine the seriousness of your pain. However, in many cases, more time must pass before a clear cause of the pain can be found. Before that point, your health care provider may not know if you need more testing or further treatment. HOME CARE INSTRUCTIONS Monitor your abdominal pain for any changes. The following actions may help to alleviate any discomfort you are experiencing:  Only take over-the-counter or prescription medicines as directed by your health care provider.  Do not take laxatives unless directed to do so by your health care provider.  Try a clear liquid diet (broth, tea, or water) as directed by your health care provider. Slowly move to a bland diet as tolerated. SEEK MEDICAL CARE IF:  You have unexplained abdominal pain.  You have abdominal pain associated with nausea or diarrhea.  You have pain when you urinate or have a bowel movement.  You experience abdominal pain that wakes you in the night.  You have abdominal pain that is worsened or improved by eating food.  You have abdominal pain that is worsened with eating fatty foods.  You have a fever. SEEK IMMEDIATE MEDICAL CARE IF:  Your pain does not go away within 2 hours.  You keep throwing up (vomiting).  Your pain is felt only in portions of the abdomen, such as the right side or the left lower portion of the abdomen.  You pass bloody or black tarry stools. MAKE SURE YOU:  Understand these instructions.  Will watch  your condition.  Will get help right away if you are not doing well or get worse.   This information is not intended to replace advice given to you by your health care provider. Make sure you discuss any questions you have with your health care provider.   Document Released: 02/03/2005 Document Revised: 01/15/2015 Document Reviewed: 01/03/2013 Elsevier Interactive Patient Education [employer] Inc[employer]

## 2015-11-08 NOTE — ED Notes (Signed)
Currently pain free.  Pt states that the cramping is like an urge to go to the bathroom but she doesn't have to go.

## 2015-11-08 NOTE — ED Provider Notes (Addendum)
CSN: [id number]     Arrival date & time 11/08/15  1654 History   First MD Initiated Contact with Patient 11/08/15 1836     Chief Complaint  Patient presents with  . Abdominal Pain     (Consider location/radiation/quality/duration/timing/severity/associated sxs/prior Treatment) HPI   Across middle of abdomen and lower abdomen having pain every few minutes, started yesterday, cramping pain. Sitting up makes it better.  When stretch out it hurts more and pain moves.  No n/v/diarrhea.  Constipation 4 days, When feel like have to go, looks like soft, small, looks like cotton balls, not normal, looks like throw up. 7/10 pain. Passing flatus.   No vaginal bleeding or discharge. In februrary did not use protection however using condoms now.    Past Medical History  Diagnosis Date  . No pertinent past medical history   . Obstruction of colon [facility](HCC)    Past Surgical History  Procedure Laterality Date  . Anterior cruciate ligament repair    . Anterior cruciate ligament repair     Family History  Problem Relation Age of Onset  . Asthma Sister   . Cancer Maternal Grandmother    Social History  Substance Use Topics  . Smoking status: Never Smoker   . Smokeless tobacco: None  . Alcohol Use: No   OB History    Gravida Para Term Preterm AB TAB SAB Ectopic Multiple Living   0 0             Review of Systems  Constitutional: Negative for fever and appetite change.  HENT: Negative for sore throat.   Eyes: Negative for visual disturbance.  Respiratory: Negative for cough and shortness of breath.   Cardiovascular: Negative for chest pain.  Gastrointestinal: Positive for abdominal pain and constipation. Negative for nausea, vomiting and diarrhea.  Genitourinary: Negative for dysuria, vaginal bleeding, vaginal discharge and difficulty urinating.  Musculoskeletal: Negative for back pain and neck pain.  Skin: Negative for rash.  Neurological: Negative for syncope and headaches.       Allergies  Other  Home Medications   Prior to Admission medications   Medication Sig Start Date End Date Taking? Authorizing Provider  etonogestrel (IMPLANON) 68 MG IMPL implant Inject 1 each into the skin once. Place 07/2014   Yes Historical Provider, MD  UNKNOWN TO PATIENT Anti-fungal foot cream (prescribed): Apply to affected area once to twice daily as needed for itch   Yes Historical Provider, MD  dicyclomine (BENTYL) 10 MG capsule Take 1 capsule (10 mg total) by mouth 3 (three) times daily as needed for spasms. 11/08/15   Alvira MondayErin Bev Drennen, MD  famotidine (PEPCID) 20 MG tablet Take 1 tablet (20 mg total) by mouth 2 (two) times daily. Patient not taking: Reported on 11/08/2015 07/18/15   Arby BarretteMarcy Pfeiffer, MD  ondansetron (ZOFRAN ODT) 4 MG disintegrating tablet Take 1 tablet (4 mg total) by mouth every 8 (eight) hours as needed for nausea or vomiting. Patient not taking: Reported on 11/08/2015 07/08/15   Barrett HenleNicole Elizabeth Nadeau, PA-C  ondansetron (ZOFRAN ODT) 4 MG disintegrating tablet Take 1 tablet (4 mg total) by mouth every 4 (four) hours as needed for nausea or vomiting. Patient not taking: Reported on 11/08/2015 07/18/15   Arby BarretteMarcy Pfeiffer, MD   BP 95/73 mmHg  Pulse 90  Temp(Src) 98 F (36.7 C) (Oral)  Resp 16  Ht 5' (1.524 m)  Wt 180 lb (81.647 kg)  BMI 35.15 kg/m2  SpO2 96% Physical Exam  Constitutional: She is oriented to person,  place, and time. She appears well-developed and well-nourished. No distress.  HENT:  Head: Normocephalic and atraumatic.  Eyes: Conjunctivae and EOM are normal.  Neck: Normal range of motion.  Cardiovascular: Normal rate, regular rhythm, normal heart sounds and intact distal pulses.  Exam reveals no gallop and no friction rub.   No murmur heard. Pulmonary/Chest: Effort normal and breath sounds normal. No respiratory distress. She has no wheezes. She has no rales.  Abdominal: Soft. She exhibits no distension. There is no tenderness. There is no  guarding, no CVA tenderness, no tenderness at McBurney's point and negative Murphy's sign.  Genitourinary: There is no tenderness on the right labia. There is no tenderness on the left labia. Cervix exhibits no motion tenderness and no discharge. Right adnexum displays no mass and no tenderness. Left adnexum displays no mass and no tenderness. No vaginal discharge found.  Musculoskeletal: She exhibits no edema or tenderness.  Neurological: She is alert and oriented to person, place, and time.  Skin: Skin is warm and dry. No rash noted. She is not diaphoretic. No erythema.  Nursing note and vitals reviewed.   ED Course  Procedures (including critical care time) Labs Review Labs Reviewed  WET PREP, GENITAL - Abnormal; Notable for the following:    WBC, Wet Prep HPF POC MANY (*)    All other components within normal limits  COMPREHENSIVE METABOLIC PANEL - Abnormal; Notable for the following:    ALT 12 (*)    All other components within normal limits  URINALYSIS, ROUTINE W REFLEX MICROSCOPIC (NOT AT [facility]) - Abnormal; Notable for the following:    APPearance CLOUDY (*)    Ketones, ur 15 (*)    Leukocytes, UA SMALL (*)    All other components within normal limits  URINE MICROSCOPIC-ADD ON - Abnormal; Notable for the following:    Squamous Epithelial / LPF 6-30 (*)    Bacteria, UA RARE (*)    All other components within normal limits  LIPASE, BLOOD  CBC  I-STAT BETA HCG BLOOD, ED ([facility], [facility], [facility] ONLY)  GC/CHLAMYDIA PROBE AMP () NOT AT [facility]    Imaging Review No results found. I have personally reviewed and evaluated these images and lab results as part of my medical decision-making.   EKG Interpretation None      MDM   Final diagnoses:  Generalized abdominal pain  Constipation, unspecified constipation type   25 year old female with history of admission for acute kidney injury in March, Presents with concern for abdominal pain starting yesterday. Labs obtained show  normal lipase, no transaminitis, no acute kidney injury. Pregnancy test negative. Urinalysis shows no sign of UTI.  Abdominal exam benign, and have low suspicion for cholecystitis, appendicitis, or diverticulitis based on exam and hx. Patient appears comfortable, and history is not consistent with ovarian torsion, or nephrolithiasis. Pelvic exam was obtained which showed a benign exam, no cervical motion tenderness, no uterine or ovarian tenderness. No abnormalities on wet prep. Patient describes constipation, small abnormal bowel movements over the last 4 days.   Recommended MiraLAX, and PCP follow-up. Feel constipation may be etiology of her pain.  Patient does have a history of admission in 2012 for concern of colonic obstruction caused by sigmoid edema, and had a colonoscopy which showed sigmoid thickening with pathology showing of question of ischemia thought to be secondary to implanon--pt  improved during the hospitalization with supportive care.  She has also presented to the ED and discharged with miralax last noted in 2013.  Today, given patient's benign abdominal exam, no nausea and vomiting, well appearance, feel that initiating treatment for constipation is appropriate, and if her symptoms worsen she may return to the emergency department and doubt current colonic obstruction.  Patient discharged in stable condition with understanding of reasons to return.   Rinnah Peppel, MD 0Alvira Monday7/02/17 [id number]  Alvira MondayErin Minsa Weddington, MD 11/09/15 [id number]

## 2015-11-10 LAB — GC/CHLAMYDIA PROBE AMP (~~LOC~~) NOT AT ARMC
Chlamydia: NEGATIVE
NEISSERIA GONORRHEA: NEGATIVE

## 2016-03-24 ENCOUNTER — Encounter (HOSPITAL_COMMUNITY): Payer: Self-pay | Admitting: *Deleted

## 2016-03-24 ENCOUNTER — Emergency Department (HOSPITAL_COMMUNITY)
Admission: EM | Admit: 2016-03-24 | Discharge: 2016-03-24 | Disposition: A | Discharge status: Home / Self Care | Payer: BLUE CROSS/BLUE SHIELD | Attending: Emergency Medicine | Admitting: Emergency Medicine

## 2016-03-24 ENCOUNTER — Emergency Department (HOSPITAL_COMMUNITY): Payer: BLUE CROSS/BLUE SHIELD

## 2016-03-24 DIAGNOSIS — M79645 Pain in left finger(s): Secondary | ICD-10-CM | POA: Insufficient documentation

## 2016-03-24 MED ORDER — NAPROXEN 500 MG PO TABS: 500.0000 mg | ORAL_TABLET | Freq: Two times a day (BID) | ORAL | 0 refills | Status: DC

## 2016-03-24 NOTE — ED Provider Notes (Signed)
[facility]-EMERGENCY DEPT Provider Note   CSN: [id number] Arrival date & time: 03/24/16  1717  By signing my name below, I, Emmanuella Mensah, attest that this documentation has been prepared under the direction and in the presence of Kerrie BuffaloHope Neese, NP. Electronically Signed: Angelene GiovanniEmmanuella Mensah, ED Scribe. 03/24/16. 6:22 PM.   History   Chief Complaint Chief Complaint  Patient presents with  . Hand Pain    HPI Comments: April Crane is a 25 y.o. female who presents to the Emergency Department complaining of gradually worsening moderate pain to her left middle finger with swelling onset 2 weeks ago. She states that she is unsure of any injuries or trauma to the hand. No alleviating factors noted. Pt has not tried any medications PTA. She has NKDA. She denies any fever, chills, open wounds, or any other symptoms.   The history is provided by the patient. No language interpreter was used.  Hand Pain  This is a new problem. The current episode started more than 1 week ago. The problem has been gradually worsening. Nothing aggravates the symptoms. Nothing relieves the symptoms. She has tried nothing for the symptoms.    Past Medical History:  Diagnosis Date  . No pertinent past medical history   . Obstruction of colon     Patient Active Problem List   Diagnosis Date Noted  . Chest pain   . Abdominal pain 07/14/2015  . Generalized weakness 07/14/2015  . AKI (acute kidney injury) (HCC) 07/13/2015  . Acute renal failure (ARF) (HCC) 07/13/2015    Past Surgical History:  Procedure Laterality Date  . ANTERIOR CRUCIATE LIGAMENT REPAIR    . ANTERIOR CRUCIATE LIGAMENT REPAIR      OB History    Gravida Para Term Preterm AB Living   0 0           SAB TAB Ectopic Multiple Live Births                   Home Medications    Prior to Admission medications   Medication Sig Start Date End Date Taking? Authorizing Provider  dicyclomine (BENTYL) 10 MG capsule Take 1 capsule (10 mg total)  by mouth 3 (three) times daily as needed for spasms. 11/08/15   Alvira MondayErin Schlossman, MD  etonogestrel (IMPLANON) 68 MG IMPL implant Inject 1 each into the skin once. Place 07/2014    Historical Provider, MD  famotidine (PEPCID) 20 MG tablet Take 1 tablet (20 mg total) by mouth 2 (two) times daily. Patient not taking: Reported on 11/08/2015 07/18/15   Arby BarretteMarcy Pfeiffer, MD  naproxen (NAPROSYN) 500 MG tablet Take 1 tablet (500 mg total) by mouth 2 (two) times daily. 03/24/16   Hope Orlene OchM Neese, NP  ondansetron (ZOFRAN ODT) 4 MG disintegrating tablet Take 1 tablet (4 mg total) by mouth every 8 (eight) hours as needed for nausea or vomiting. Patient not taking: Reported on 11/08/2015 07/08/15   Barrett HenleNicole Elizabeth Nadeau, PA-C  ondansetron (ZOFRAN ODT) 4 MG disintegrating tablet Take 1 tablet (4 mg total) by mouth every 4 (four) hours as needed for nausea or vomiting. Patient not taking: Reported on 11/08/2015 07/18/15   Arby BarretteMarcy Pfeiffer, MD  UNKNOWN TO PATIENT Anti-fungal foot cream (prescribed): Apply to affected area once to twice daily as needed for itch    Historical Provider, MD    Family History Family History  Problem Relation Age of Onset  . Asthma Sister   . Cancer Maternal Grandmother     Social History Social History  Substance Use Topics  . Smoking status: Never Smoker  . Smokeless tobacco: Never Used  . Alcohol use No     Allergies   Other   Review of Systems Review of Systems  Constitutional: Negative for chills and fever.  Musculoskeletal: Positive for arthralgias and joint swelling.  Skin: Negative for wound.     Physical Exam Updated Vital Signs BP 121/73 (BP Location: Left Arm)   Pulse 84   Temp 99.7 F (37.6 C) (Oral)   Resp 18   Ht 5' 0.5" (1.537 m)   Wt 81.6 kg   SpO2 100%   BMI 34.58 kg/m   Physical Exam  Constitutional: She is oriented to person, place, and time. She appears well-developed and well-nourished. No distress.  HENT:  Head: Normocephalic and atraumatic.    Eyes: Conjunctivae and EOM are normal.  Neck: Neck supple. No tracheal deviation present.  Cardiovascular: Normal rate.   Pulmonary/Chest: Effort normal. No respiratory distress.  Musculoskeletal:       Left hand: She exhibits tenderness and swelling. She exhibits normal capillary refill, no deformity and no laceration. Normal sensation noted. Normal strength noted. She exhibits no thumb/finger opposition.  Radial pulses 2+; adequate circulation  Tenderness and swelling to the PIP of left middle digit; no tenderness at the DIP. There is no erythema, red streaking or ecchymosis noted.   Neurological: She is alert and oriented to person, place, and time.  Skin: Skin is warm and dry.  Psychiatric: She has a normal mood and affect. Her behavior is normal.  Nursing note and vitals reviewed.    ED Treatments / Results  DIAGNOSTIC STUDIES: Oxygen Saturation is 100% on RA, normal by my interpretation.    COORDINATION OF CARE: 6:19 PM- Pt advised of plan for treatment and pt agrees. Pt will receive middle finger x-ray for further evaluation. She will receive finger splint. Will provide resources for Ortho follow up.    Labs (all labs ordered are listed, but only abnormal results are displayed) Labs Reviewed - No data to display  EKG  EKG Interpretation None       Radiology Dg Finger Middle Left  Result Date: 03/24/2016 CLINICAL DATA:  Left finger pain/ swelling along PIP joint EXAM: LEFT MIDDLE FINGER 2+V COMPARISON:  Left hand radiographs dated 07/15/2007. FINDINGS: No fracture or dislocation is seen. The joint spaces are preserved. Mild diffuse soft tissue swelling along the PIP joint. IMPRESSION: Mild diffuse soft tissue swelling along the PIP joint. No acute osseus abnormality is seen. Electronically Signed   By: Charline Bills M.D.   On: 03/24/2016 18:14    Procedures Procedures (including critical care time)  Medications Ordered in ED Medications - No data to  display   Initial Impression / Assessment and Plan / ED Course  Kerrie Buffalo, NP has reviewed the triage vital signs and the nursing notes.  Pertinent labs & imaging results that were available during my care of the patient were reviewed by me and considered in my medical decision making (see chart for details).  Clinical Course    Patient X-Ray negative for obvious fracture or dislocation. Pain managed in ED. Pt advised to follow up with orthopedics if symptoms persist for possibility of missed fracture diagnosis. Patient given splint while in ED, conservative therapy recommended and discussed. Patient will be dc home & is agreeable with above plan.  Final Clinical Impressions(s) / ED Diagnoses   Final diagnoses:  Finger pain, left    New Prescriptions Discharge Medication List  as of 03/24/2016  6:26 PM    START taking these medications   Details  naproxen (NAPROSYN) 500 MG tablet Take 1 tablet (500 mg total) by mouth 2 (two) times daily., Starting Wed 03/24/2016, Print       I personally performed the services described in this documentation, which was scribed in my presence. The recorded information has been reviewed and is accurate.    [address]Hope HoldenM Neese, NP 03/25/16 Cleophas [id number]    Lyndal Pulleyaniel Knott, MD 03/25/16 [phone]

## 2016-03-24 NOTE — Discharge Instructions (Signed)
Take the medication as needed. Elevate the hand, apply ice. Follow up with ortho if symptoms persist. Return here as needed.

## 2016-03-24 NOTE — ED Notes (Signed)
Declined W/C at D/C and was escorted to lobby by RN. 

## 2017-02-22 ENCOUNTER — Encounter (HOSPITAL_BASED_OUTPATIENT_CLINIC_OR_DEPARTMENT_OTHER): Payer: Self-pay

## 2017-02-22 ENCOUNTER — Emergency Department (HOSPITAL_BASED_OUTPATIENT_CLINIC_OR_DEPARTMENT_OTHER)
Admission: EM | Admit: 2017-02-22 | Discharge: 2017-02-22 | Disposition: A | Discharge status: Home / Self Care | Payer: BLUE CROSS/BLUE SHIELD | Attending: Emergency Medicine | Admitting: Emergency Medicine

## 2017-02-22 DIAGNOSIS — R110 Nausea: Secondary | ICD-10-CM | POA: Insufficient documentation

## 2017-02-22 DIAGNOSIS — J209 Acute bronchitis, unspecified: Secondary | ICD-10-CM | POA: Insufficient documentation

## 2017-02-22 MED ORDER — ALBUTEROL SULFATE HFA 108 (90 BASE) MCG/ACT IN AERS: 2.0000 | INHALATION_SPRAY | RESPIRATORY_TRACT | 0 refills | Status: DC | PRN

## 2017-02-22 MED ORDER — ONDANSETRON 8 MG PO TBDP: 8.0000 mg | ORAL_TABLET | Freq: Three times a day (TID) | ORAL | 0 refills | Status: DC | PRN

## 2017-02-22 MED ORDER — AZITHROMYCIN 250 MG PO TABS: 250.0000 mg | ORAL_TABLET | Freq: Every day | ORAL | 0 refills | Status: DC

## 2017-02-22 NOTE — ED Provider Notes (Signed)
[facility] EMERGENCY DEPARTMENT Provider Note   CSN: [id number] Arrival date & time: 02/22/17  1957     History   Chief Complaint Chief Complaint  Patient presents with  . Cough    HPI April Crane is a 26 y.o. female.  HPI Patient is a 26 year old female presents to the emergency department with complaints of ongoing productive cough for the past week. Denies fevers and chills. Some nausea vomiting. Denies diarrhea. No chest pain. No abdominal pain. No tobacco abuse. Denies a history of asthma. Symptoms are mild in severity. No sick contacts. No other complaints. Denies exertional shortness of breath   Past Medical History:  Diagnosis Date  . No pertinent past medical history   . Obstruction of colon [facility])     Patient Active Problem List   Diagnosis Date Noted  . Chest pain   . Abdominal pain 07/14/2015  . Generalized weakness 07/14/2015  . AKI (acute kidney injury) (HCC) 07/13/2015  . Acute renal failure (ARF) (HCC) 07/13/2015    Past Surgical History:  Procedure Laterality Date  . ANTERIOR CRUCIATE LIGAMENT REPAIR    . ANTERIOR CRUCIATE LIGAMENT REPAIR      OB History    Gravida Para Term Preterm AB Living   0 0           SAB TAB Ectopic Multiple Live Births                   Home Medications    Prior to Admission medications   Medication Sig Start Date End Date Taking? Authorizing Provider  albuterol (PROVENTIL HFA;VENTOLIN HFA) 108 (90 Base) MCG/ACT inhaler Inhale 2 puffs into the lungs every 4 (four) hours as needed for wheezing or shortness of breath. 02/22/17   Azalia Bilis, MD  azithromycin (ZITHROMAX Z-PAK) 250 MG tablet Take 1 tablet (250 mg total) by mouth daily. Take 2 tabs for first dose, then 1 tab for each additional dose 02/22/17   Azalia Bilis, MD  ondansetron (ZOFRAN ODT) 8 MG disintegrating tablet Take 1 tablet (8 mg total) by mouth every 8 (eight) hours as needed for nausea or vomiting. 02/22/17   Azalia Bilis, MD     Family History Family History  Problem Relation Age of Onset  . Asthma Sister   . Cancer Maternal Grandmother     Social History Social History  Substance Use Topics  . Smoking status: Never Smoker  . Smokeless tobacco: Never Used  . Alcohol use No     Allergies   Other   Review of Systems Review of Systems  All other systems reviewed and are negative.    Physical Exam Updated Vital Signs BP 128/81 (BP Location: Left Arm)   Pulse (!) 105   Temp 98.8 F (37.1 C) (Oral)   Resp 18   Ht 5' (1.524 m)   Wt 82.6 kg (182 lb)   SpO2 100%   BMI 35.54 kg/m   Physical Exam  Constitutional: She is oriented to person, place, and time. She appears well-developed and well-nourished.  HENT:  Head: Normocephalic.  Eyes: EOM are normal.  Neck: Normal range of motion.  Pulmonary/Chest: Effort normal.  Abdominal: She exhibits no distension.  Musculoskeletal: Normal range of motion.  Neurological: She is alert and oriented to person, place, and time.  Psychiatric: She has a normal mood and affect.  Nursing note and vitals reviewed.    ED Treatments / Results  Labs (all labs ordered are listed, but only abnormal results  are displayed) Labs Reviewed - No data to display  EKG  EKG Interpretation None       Radiology No results found.  Procedures Procedures (including critical care time)  Medications Ordered in ED Medications - No data to display   Initial Impression / Assessment and Plan / ED Course  I have reviewed the triage vital signs and the nursing notes.  Pertinent labs & imaging results that were available during my care of the patient were reviewed by me and considered in my medical decision making (see chart for details).     Patient is overall well appearing. Likely acute bronchitis. Could represent developing pneumonia given persistence of symptoms for 1 week. Patient be started on azithromycin and bronchodilators. Primary care follow-up.  She understands to return to the ER for new or worsening symptoms  Final Clinical Impressions(s) / ED Diagnoses   Final diagnoses:  Acute bronchitis, unspecified organism  Nausea    New Prescriptions New Prescriptions   ALBUTEROL (PROVENTIL HFA;VENTOLIN HFA) 108 (90 BASE) MCG/ACT INHALER    Inhale 2 puffs into the lungs every 4 (four) hours as needed for wheezing or shortness of breath.   AZITHROMYCIN (ZITHROMAX Z-PAK) 250 MG TABLET    Take 1 tablet (250 mg total) by mouth daily. Take 2 tabs for first dose, then 1 tab for each additional dose   ONDANSETRON (ZOFRAN ODT) 8 MG DISINTEGRATING TABLET    Take 1 tablet (8 mg total) by mouth every 8 (eight) hours as needed for nausea or vomiting.     Azalia Bilis, MD 02/22/17 2050

## 2017-02-22 NOTE — ED Triage Notes (Signed)
C/o cough x 1 week-NAD-steady gait 

## 2017-05-04 ENCOUNTER — Emergency Department (HOSPITAL_COMMUNITY)
Admission: EM | Admit: 2017-05-04 | Discharge: 2017-05-04 | Disposition: A | Discharge status: Home / Self Care | Payer: No Typology Code available for payment source | Attending: Emergency Medicine | Admitting: Emergency Medicine

## 2017-05-04 ENCOUNTER — Other Ambulatory Visit: Payer: Self-pay

## 2017-05-04 ENCOUNTER — Encounter (HOSPITAL_COMMUNITY): Payer: Self-pay | Admitting: Emergency Medicine

## 2017-05-04 DIAGNOSIS — Z79899 Other long term (current) drug therapy: Secondary | ICD-10-CM | POA: Insufficient documentation

## 2017-05-04 DIAGNOSIS — B309 Viral conjunctivitis, unspecified: Secondary | ICD-10-CM

## 2017-05-04 MED ORDER — NAPHAZOLINE-PHENIRAMINE 0.025-0.3 % OP SOLN: 1.0000 [drp] | Freq: Four times a day (QID) | OPHTHALMIC | 0 refills | Status: DC | PRN

## 2017-05-04 MED ORDER — ERYTHROMYCIN 5 MG/GM OP OINT: TOPICAL_OINTMENT | OPHTHALMIC | 0 refills | Status: DC

## 2017-05-04 NOTE — Discharge Instructions (Signed)
Please read attached information regarding her condition. Use eyedrops as needed for use antibiotic ointment in the eyes to prevent any bacterial infection. Follow-up with your primary care provider for further evaluation. Return to ED for worsening symptoms, vision changes, increased swelling around eye, pain with eye movements.

## 2017-05-04 NOTE — ED Triage Notes (Signed)
Pt to ER for one day hx of eye swelling, drainage, and pruritis. States eye was closed shut this morning with drainage. NAD

## 2017-05-04 NOTE — ED Provider Notes (Signed)
[name]: [id number] Arrival date & time: 05/04/17  1216     History   Chief Complaint Chief Complaint  Patient presents with  . Conjunctivitis    HPI April Crane is a 26 y.o. female who presents to ED for evaluation of 1 day history of right-sided eye redness, drainage and pruritus.  States that she now feels the same symptoms in her left eye.  Woke up with both of her eyes matted together shut.  Her nephew had similar symptoms last week and was diagnosed with conjunctivitis.  She has not tried any over-the-counter medications to help with symptoms.  She denies any fevers, foreign body sensation, injury to eye, trauma, swelling around eye, vision changes or contact lens use.  HPI  Past Medical History:  Diagnosis Date  . No pertinent past medical history   . Obstruction of colon [facility](HCC)     Patient Active Problem List   Diagnosis Date Noted  . Chest pain   . Abdominal pain 07/14/2015  . Generalized weakness 07/14/2015  . AKI (acute kidney injury) (HCC) 07/13/2015  . Acute renal failure (ARF) (HCC) 07/13/2015    Past Surgical History:  Procedure Laterality Date  . ANTERIOR CRUCIATE LIGAMENT REPAIR    . ANTERIOR CRUCIATE LIGAMENT REPAIR      OB History    Gravida Para Term Preterm AB Living   0 0           SAB TAB Ectopic Multiple Live Births                   Home Medications    Prior to Admission medications   Medication Sig Start Date End Date Taking? Authorizing Provider  albuterol (PROVENTIL HFA;VENTOLIN HFA) 108 (90 Base) MCG/ACT inhaler Inhale 2 puffs into the lungs every 4 (four) hours as needed for wheezing or shortness of breath. 02/22/17   Azalia Bilisampos, Kevin, MD  azithromycin (ZITHROMAX Z-PAK) 250 MG tablet Take 1 tablet (250 mg total) by mouth daily. Take 2 tabs for first dose, then 1 tab for each additional dose 02/22/17   Azalia Bilisampos, Kevin, MD  erythromycin ophthalmic ointment Place a 1/2  inch ribbon of ointment into the lower eyelid. 05/04/17   Morry Veiga, PA-C  naphazoline-pheniramine (NAPHCON-A) 0.025-0.3 % ophthalmic solution Place 1 drop into both eyes 4 (four) times daily as needed for eye irritation. 05/04/17   Aneli Zara, PA-C  ondansetron (ZOFRAN ODT) 8 MG disintegrating tablet Take 1 tablet (8 mg total) by mouth every 8 (eight) hours as needed for nausea or vomiting. 02/22/17   Azalia Bilisampos, Kevin, MD    Family History Family History  Problem Relation Age of Onset  . Asthma Sister   . Cancer Maternal Grandmother     Social History Social History   Tobacco Use  . Smoking status: Never Smoker  . Smokeless tobacco: Never Used  Substance Use Topics  . Alcohol use: No  . Drug use: No     Allergies   Other   Review of Systems Review of Systems  Constitutional: Negative for chills and fever.  HENT: Negative for congestion, rhinorrhea, sore throat and trouble swallowing.   Eyes: Positive for discharge, redness and itching. Negative for photophobia, pain and visual disturbance.  Skin: Negative for rash.     Physical Exam Updated Vital Signs BP 116/77 (BP Location: Right Arm)   Pulse 74   Temp 98.4 F (36.9 C) (Oral)   Resp 18  SpO2 100%   Physical Exam  Constitutional: She appears well-developed and well-nourished. No distress.  HENT:  Head: Normocephalic and atraumatic.  Eyes: EOM are normal. Pupils are equal, round, and reactive to light. Right eye exhibits discharge. Left eye exhibits discharge. Right conjunctiva is injected. Left conjunctiva is injected. No scleral icterus. Left eye exhibits normal extraocular motion.  Right and left eye with injected conjunctiva, no eyelid swelling or erythema or tenderness to palpation.  Mild clear drainage noted bilaterally.  No foreign bodies noted.  No pain with EOMs.  No chemosis, proptosis, or consensual photophobia.  Neck: Normal range of motion.  Pulmonary/Chest: Effort normal. No respiratory distress.   Neurological: She is alert.  Skin: No rash noted. She is not diaphoretic.  Psychiatric: She has a normal mood and affect.  Nursing note and vitals reviewed.    ED Treatments / Results  Labs (all labs ordered are listed, but only abnormal results are displayed) Labs Reviewed - No data to display  EKG  EKG Interpretation None       Radiology No results found.  Procedures Procedures (including critical care time)  Medications Ordered in ED Medications - No data to display   Initial Impression / Assessment and Plan / ED Course  I have reviewed the triage vital signs and the nursing notes.  Pertinent labs & imaging results that were available during my care of the patient were reviewed by me and considered in my medical decision making (see chart for details).      Patient presents to ED for evaluation of 1 day history of right-sided eye redness, drainage and pruritus that has now developed in her left eye as well.  Sick contacts with nephew with similar symptoms last week and diagnosed with conjunctivitis.  Denies any foreign body sensation, contact lens use, vision changes, swelling around eye, injury or trauma.  On physical exam she is overall well-appearing.  There is clear drainage noted as well as mild conjunctival injection bilaterally. Patient presentation consistent with conjunctivitis.  No evidence of corneal abrasions, entrapment, consensual photophobia, or herpes keratitis.  Presentation not concerning for iritis, or corneal abrasions.  Pt discharged with erythromycin and Naphcon for comfort.  Personal hygiene and frequent handwashing discussed.  Parent advised to follow up with pediatrician if symptoms persist or worsen. Return precautions discussed.  Parent verbalizes understanding and is agreeable with discharge.   Final Clinical Impressions(s) / ED Diagnoses   Final diagnoses:  Acute viral conjunctivitis of both eyes    ED Discharge Orders        Ordered     naphazoline-pheniramine (NAPHCON-A) 0.025-0.3 % ophthalmic solution  4 times daily PRN     05/04/17 1327    erythromycin ophthalmic ointment     05/04/17 1327     Portions of this note were generated with Dragon dictation software. Dictation errors may occur despite best attempts at proofreading.    Dietrich PatesKhatri, Candid Bovey, PA-C 05/04/17 1331    Lavera GuiseLiu, Dana Duo, MD 05/04/17 [id number]

## 2017-07-28 ENCOUNTER — Encounter (HOSPITAL_COMMUNITY): Payer: Self-pay

## 2017-07-28 ENCOUNTER — Other Ambulatory Visit: Payer: Self-pay

## 2017-07-28 ENCOUNTER — Emergency Department (HOSPITAL_COMMUNITY)
Admission: EM | Admit: 2017-07-28 | Discharge: 2017-07-29 | Disposition: A | Discharge status: Home / Self Care | Payer: Self-pay | Attending: Emergency Medicine | Admitting: Emergency Medicine

## 2017-07-28 DIAGNOSIS — T148XXA Other injury of unspecified body region, initial encounter: Secondary | ICD-10-CM

## 2017-07-28 DIAGNOSIS — W260XXA Contact with knife, initial encounter: Secondary | ICD-10-CM | POA: Insufficient documentation

## 2017-07-28 DIAGNOSIS — Y999 Unspecified external cause status: Secondary | ICD-10-CM | POA: Insufficient documentation

## 2017-07-28 DIAGNOSIS — S60311A Abrasion of right thumb, initial encounter: Secondary | ICD-10-CM | POA: Insufficient documentation

## 2017-07-28 DIAGNOSIS — Z79899 Other long term (current) drug therapy: Secondary | ICD-10-CM | POA: Insufficient documentation

## 2017-07-28 DIAGNOSIS — Z23 Encounter for immunization: Secondary | ICD-10-CM | POA: Insufficient documentation

## 2017-07-28 DIAGNOSIS — Y929 Unspecified place or not applicable: Secondary | ICD-10-CM | POA: Insufficient documentation

## 2017-07-28 DIAGNOSIS — Y93G1 Activity, food preparation and clean up: Secondary | ICD-10-CM | POA: Insufficient documentation

## 2017-07-28 MED ORDER — TETANUS-DIPHTH-ACELL PERTUSSIS 5-2.5-18.5 LF-MCG/0.5 IM SUSP
0.5000 mL | Freq: Once | INTRAMUSCULAR | Status: AC
  Administered 2017-07-28: 0.5 mL via INTRAMUSCULAR
  Filled 2017-07-28: qty 0.5

## 2017-07-28 MED ORDER — BACITRACIN ZINC 500 UNIT/GM EX OINT
TOPICAL_OINTMENT | Freq: Once | CUTANEOUS | Status: AC
  Administered 2017-07-28: 1 via TOPICAL
  Filled 2017-07-28: qty 0.9

## 2017-07-28 MED ORDER — ACETAMINOPHEN 325 MG PO TABS
650.0000 mg | ORAL_TABLET | Freq: Once | ORAL | Status: AC
  Administered 2017-07-28: 650 mg via ORAL
  Filled 2017-07-28: qty 2

## 2017-07-28 NOTE — ED Triage Notes (Signed)
Pt presents with laceration to tip of R thumb while cutting cabbage with kitchen knife at 2100 tonight.

## 2017-07-28 NOTE — Discharge Instructions (Signed)
Keep the wound clean and dry for the first 24 hours. After that you may gently clean the wound with soap and water. Make sure to pat dry the wound before covering it with any dressing. You can use topical antibiotic ointment and bandage. Ice and elevate for pain relief.   You can take Tylenol for pain.   Monitor closely for any signs of infection. Return to the Emergency Department for any worsening redness/swelling of the area that begins to spread, drainage from the site, worsening pain, fever or any other worsening or concerning symptoms.

## 2017-07-28 NOTE — ED Notes (Signed)
ED Provider at bedside. 

## 2017-07-28 NOTE — ED Provider Notes (Signed)
[facility] EMERGENCY DEPARTMENT Provider Note   CSN: [id number] Arrival date & time: 07/28/17  2147     History   Chief Complaint Chief Complaint  Patient presents with  . Finger Injury    HPI April Crane is a 27 y.o. female who presents for evaluation of right thumb injury that occurred at 9 PM.  Patient states that she was cutting cabbage when the knife slipped, causing a small avulsion of the skin to the distal aspect of the thumb.  Bleeding is controlled on ED arrival.  Patient reports she is not on any blood thinners.  Patient has not taken any medications for the pain.  Patient does not recall when her last tetanus shot was.  Patient denies any fever, numbness/weakness.  The history is provided by the patient.    Past Medical History:  Diagnosis Date  . No pertinent past medical history   . Obstruction of colon [facility])     Patient Active Problem List   Diagnosis Date Noted  . Chest pain   . Abdominal pain 07/14/2015  . Generalized weakness 07/14/2015  . AKI (acute kidney injury) (HCC) 07/13/2015  . Acute renal failure (ARF) (HCC) 07/13/2015    Past Surgical History:  Procedure Laterality Date  . ANTERIOR CRUCIATE LIGAMENT REPAIR    . ANTERIOR CRUCIATE LIGAMENT REPAIR      OB History    Gravida  0   Para  0   Term      Preterm      AB      Living        SAB      TAB      Ectopic      Multiple      Live Births               Home Medications    Prior to Admission medications   Medication Sig Start Date End Date Taking? Authorizing Provider  albuterol (PROVENTIL HFA;VENTOLIN HFA) 108 (90 Base) MCG/ACT inhaler Inhale 2 puffs into the lungs every 4 (four) hours as needed for wheezing or shortness of breath. 02/22/17   Azalia Bilis, MD  azithromycin (ZITHROMAX Z-PAK) 250 MG tablet Take 1 tablet (250 mg total) by mouth daily. Take 2 tabs for first dose, then 1 tab for each additional dose 02/22/17   Azalia Bilis, MD    erythromycin ophthalmic ointment Place a 1/2 inch ribbon of ointment into the lower eyelid. 05/04/17   Khatri, Hina, PA-C  naphazoline-pheniramine (NAPHCON-A) 0.025-0.3 % ophthalmic solution Place 1 drop into both eyes 4 (four) times daily as needed for eye irritation. 05/04/17   Khatri, Hina, PA-C  ondansetron (ZOFRAN ODT) 8 MG disintegrating tablet Take 1 tablet (8 mg total) by mouth every 8 (eight) hours as needed for nausea or vomiting. 02/22/17   Azalia Bilis, MD    Family History Family History  Problem Relation Age of Onset  . Asthma Sister   . Cancer Maternal Grandmother     Social History Social History   Tobacco Use  . Smoking status: Never Smoker  . Smokeless tobacco: Never Used  Substance Use Topics  . Alcohol use: No  . Drug use: No     Allergies   Other   Review of Systems Review of Systems  Skin: Positive for wound.  Neurological: Negative for weakness and numbness.     Physical Exam Updated Vital Signs BP 121/79   Pulse 91   Temp 98.9 F (37.2  C) (Oral)   Resp 18   SpO2 100%   Physical Exam  Constitutional: She appears well-developed and well-nourished.  HENT:  Head: Normocephalic and atraumatic.  Eyes: Conjunctivae and EOM are normal. Right eye exhibits no discharge. Left eye exhibits no discharge. No scleral icterus.  Cardiovascular:  Pulses:      Radial pulses are 2+ on the right side, and 2+ on the left side.  Pulmonary/Chest: Effort normal.  Musculoskeletal:  Full range of motion of right wrist intact without any difficulty.  No tenderness palpation right wrist, right hand.  Full range of motion of all 5 digits of right hand without any difficulty.  No tenderness palpation to the base of the right thumb.  Mild tenderness to palpation to the distal aspect of the thumb where the abrasion is.  No bony tenderness.  No deformity or crepitus noted.  Full range of motion of right thumb intact without any difficulty.  Neurological: She is alert.   Skin: Skin is warm and dry. Capillary refill takes less than 2 seconds.  Good distal cap refill.  Right thumb has a superficial, small less than 0.5 cm skin abrasion to the distal aspect of the thumb.  No deep laceration noted.  No nail injury.  Psychiatric: She has a normal mood and affect. Her speech is normal and behavior is normal.  Nursing note and vitals reviewed.    ED Treatments / Results  Labs (all labs ordered are listed, but only abnormal results are displayed) Labs Reviewed - No data to display  EKG  EKG Interpretation None       Radiology No results found.  Procedures Procedures (including critical care time)  Medications Ordered in ED Medications  acetaminophen (TYLENOL) tablet 650 mg (has no administration in time range)  Tdap (BOOSTRIX) injection 0.5 mL (has no administration in time range)  bacitracin ointment (has no administration in time range)     Initial Impression / Assessment and Plan / ED Course  I have reviewed the triage vital signs and the nursing notes.  Pertinent labs & imaging results that were available during my care of the patient were reviewed by me and considered in my medical decision making (see chart for details).     27 year old female who presents for evaluation of finger injury that occurred this evening at 9 PM.  Patient cut the end of the finger with a knife while cutting cabbage.  Tetanus is not up-to-date. Patient is afebrile, non-toxic appearing, sitting comfortably on examination table. Vital signs reviewed and stable. Patient is neurovascularly intact. On exam, patient is a very small superficial skin abrasion noted to the distal aspect of the right thumb.  The small area of the skin has been removed.  No deep laceration.  No bony tenderness of the thumb.  Full range of motion without any difficulty.  No evidence of deep laceration that would require suture or repair.  No evidence of fracture or dislocation that would need  x-ray imaging.  We will plan to soak the wound and evaluate.  Tetanus will be updated in the department.  Review after extensive irrigation with saline.  No evidence of deep laceration that would require repair here in the ED today.  Patient has a small abrasion where the skin is missing. There is no skin flap to repair with.  I offered to Dermabond the area to protect it but patient declined at this time.  We will plan to apply bacitracin ointment, dressing.  Wound care  precautions discussed with patient and family. Patient had ample opportunity for questions and discussion. All patient's questions were answered with full understanding. Strict return precautions discussed. Patient expresses understanding and agreement to plan.   Final Clinical Impressions(s) / ED Diagnoses   Final diagnoses:  Abrasion    ED Discharge Orders    None       Rosana Hoes 07/28/17 2342    Charlynne Pander, MD 07/28/17 [phone]

## 2017-07-29 NOTE — ED Notes (Signed)
Pt departed in NAD, refused use of wheelchair.  

## 2017-11-14 ENCOUNTER — Other Ambulatory Visit: Payer: Self-pay

## 2017-11-14 ENCOUNTER — Encounter (HOSPITAL_COMMUNITY): Payer: Self-pay

## 2017-11-14 ENCOUNTER — Emergency Department (HOSPITAL_COMMUNITY)
Admission: EM | Admit: 2017-11-14 | Discharge: 2017-11-14 | Disposition: A | Discharge status: Home / Self Care | Payer: Self-pay | Attending: Emergency Medicine | Admitting: Emergency Medicine

## 2017-11-14 DIAGNOSIS — Z79899 Other long term (current) drug therapy: Secondary | ICD-10-CM | POA: Insufficient documentation

## 2017-11-14 DIAGNOSIS — S80862A Insect bite (nonvenomous), left lower leg, initial encounter: Secondary | ICD-10-CM

## 2017-11-14 DIAGNOSIS — W57XXXA Bitten or stung by nonvenomous insect and other nonvenomous arthropods, initial encounter: Secondary | ICD-10-CM

## 2017-11-14 DIAGNOSIS — T63441A Toxic effect of venom of bees, accidental (unintentional), initial encounter: Secondary | ICD-10-CM | POA: Insufficient documentation

## 2017-11-14 MED ORDER — CEPHALEXIN 500 MG PO CAPS: 500.0000 mg | ORAL_CAPSULE | Freq: Four times a day (QID) | ORAL | 0 refills | Status: DC

## 2017-11-14 NOTE — ED Provider Notes (Signed)
[facility]   CSN: [id number] Arrival date & time: 11/14/17  1919     History   Chief Complaint Chief Complaint  Patient presents with  . Insect Bite    HPI April Crane is a 27 y.o. female.  HPI 27 year old African American female with no pertinent past medical history presents to the ED for evaluation of bee sting to her left leg.  Patient states that 2 days ago she was stung by a bee to the left in her leg.  States that since then the redness, warmth has increased.  She also reports some burning sensation to the area.  Patient denies any associated fevers, chills, nausea or vomiting.  She is not take any medications for her symptoms.  No history of allergies.  Denies any difficulty breathing or swallowing.  Nothing makes symptoms better or worse. Past Medical History:  Diagnosis Date  . No pertinent past medical history   . Obstruction of colon [facility](HCC)     Patient Active Problem List   Diagnosis Date Noted  . Chest pain   . Abdominal pain 07/14/2015  . Generalized weakness 07/14/2015  . AKI (acute kidney injury) (HCC) 07/13/2015  . Acute renal failure (ARF) (HCC) 07/13/2015    Past Surgical History:  Procedure Laterality Date  . ANTERIOR CRUCIATE LIGAMENT REPAIR    . ANTERIOR CRUCIATE LIGAMENT REPAIR       OB History    Gravida  0   Para  0   Term      Preterm      AB      Living        SAB      TAB      Ectopic      Multiple      Live Births               Home Medications    Prior to Admission medications   Medication Sig Start Date End Date Taking? Authorizing Provider  albuterol (PROVENTIL HFA;VENTOLIN HFA) 108 (90 Base) MCG/ACT inhaler Inhale 2 puffs into the lungs every 4 (four) hours as needed for wheezing or shortness of breath. 02/22/17   Azalia Bilisampos, Kevin, MD  azithromycin (ZITHROMAX Z-PAK) 250 MG tablet Take 1 tablet (250 mg total) by mouth daily. Take 2 tabs for first dose,  then 1 tab for each additional dose 02/22/17   Azalia Bilisampos, Kevin, MD  cephALEXin (KEFLEX) 500 MG capsule Take 1 capsule (500 mg total) by mouth 4 (four) times daily. 11/14/17   Rise MuLeaphart, Vedanshi Massaro T, PA-C  erythromycin ophthalmic ointment Place a 1/2 inch ribbon of ointment into the lower eyelid. 05/04/17   Khatri, Hina, PA-C  naphazoline-pheniramine (NAPHCON-A) 0.025-0.3 % ophthalmic solution Place 1 drop into both eyes 4 (four) times daily as needed for eye irritation. 05/04/17   Khatri, Hina, PA-C  ondansetron (ZOFRAN ODT) 8 MG disintegrating tablet Take 1 tablet (8 mg total) by mouth every 8 (eight) hours as needed for nausea or vomiting. 02/22/17   Azalia Bilisampos, Kevin, MD    Family History Family History  Problem Relation Age of Onset  . Asthma Sister   . Cancer Maternal Grandmother     Social History Social History   Tobacco Use  . Smoking status: Never Smoker  . Smokeless tobacco: Never Used  Substance Use Topics  . Alcohol use: No  . Drug use: No     Allergies   Other   Review of Systems Review of  Systems  Constitutional: Negative for chills and fever.  Respiratory: Negative for shortness of breath and wheezing.   Gastrointestinal: Negative for vomiting.  Musculoskeletal: Positive for myalgias.  Skin: Positive for color change.  Neurological: Negative for numbness.     Physical Exam Updated Vital Signs BP 94/64 (BP Location: Right Arm)   Pulse 80   Temp 98.7 F (37.1 C) (Oral)   Resp 15   Ht 5' 0.5" (1.537 m)   Wt 78 kg (172 lb)   SpO2 100%   BMI 33.04 kg/m   Physical Exam  Constitutional: She appears well-developed and well-nourished. No distress.  HENT:  Head: Normocephalic and atraumatic.  Eyes: Right eye exhibits no discharge. Left eye exhibits no discharge. No scleral icterus.  Neck: Normal range of motion.  Pulmonary/Chest: No respiratory distress.  Musculoskeletal: Normal range of motion.  DP pulses are 2+ bilaterally.  Sensation intact.  Brisk cap  refill.  Neurological: She is alert.  Skin: Skin is warm and dry. Capillary refill takes less than 2 seconds. No pallor.  Patient has a proximal by 3 x 3 cm area of erythema and warmth to the left lower inner leg just above the medial malleolus.  There is no area of fluctuance or purulent drainage.  No tenderness with palpation.  No signs of foreign body.  Skin compartments are soft.  Psychiatric: Her behavior is normal. Judgment and thought content normal.  Nursing note and vitals reviewed.    ED Treatments / Results  Labs (all labs ordered are listed, but only abnormal results are displayed) Labs Reviewed - No data to display  EKG None  Radiology No results found.  Procedures Procedures (including critical care time)  Medications Ordered in ED Medications - No data to display   Initial Impression / Assessment and Plan / ED Course  I have reviewed the triage vital signs and the nursing notes.  Pertinent labs & imaging results that were available during my care of the patient were reviewed by me and considered in my medical decision making (see chart for details).     Patient presents the ED for evaluation of bee sting to the left leg.  This happened 2 days ago.  Erythema and warmth to the area.  No signs of abscess at this time.  Patient has no systemic symptoms.  Will treat his cellulitis with Keflex.  Encouraged symptom Medicare at home with Benadryl and Pepcid for pruritis.   Pt is hemodynamically stable, in NAD, & able to ambulate in the ED. Evaluation does not show pathology that would require ongoing emergent intervention or inpatient treatment. I explained the diagnosis to the patient. Pain has been managed & has no complaints prior to dc. Pt is comfortable with above plan and is stable for discharge at this time. All questions were answered prior to disposition. Strict return precautions for f/u to the ED were discussed. Encouraged follow up with PCP.   Final Clinical  Impressions(s) / ED Diagnoses   Final diagnoses:  Insect bite of left lower leg, initial encounter    ED Discharge Orders        Ordered    cephALEXin (KEFLEX) 500 MG capsule  4 times daily     11/14/17 2223       Wallace Keller 11/14/17 2229    Maia Plan, MD 11/15/17 1115

## 2017-11-14 NOTE — Discharge Instructions (Addendum)
Take the antibiotics as prescribed.  I would use Benadryl at night to help with the itching and you may also use a daily Pepcid or Zantac to help with itching.  If you develop worsening symptoms or the redness increases follow-up with primary care doctor and the symptoms not improving the next 2 to 3 days make sure you follow with your primary care doctor return the ED.

## 2017-11-14 NOTE — ED Triage Notes (Signed)
Pt endorses being stung by a bee on Saturday on the lower inner leg just above the ankle, no hx of allergies. Area around sting appears red. VSS.

## 2018-01-05 ENCOUNTER — Other Ambulatory Visit: Payer: Self-pay

## 2018-01-05 ENCOUNTER — Encounter (HOSPITAL_COMMUNITY): Payer: Self-pay | Admitting: Emergency Medicine

## 2018-01-05 ENCOUNTER — Emergency Department (HOSPITAL_COMMUNITY)
Admission: EM | Admit: 2018-01-05 | Discharge: 2018-01-05 | Disposition: A | Discharge status: Home / Self Care | Payer: No Typology Code available for payment source | Attending: Emergency Medicine | Admitting: Emergency Medicine

## 2018-01-05 ENCOUNTER — Emergency Department (HOSPITAL_COMMUNITY): Payer: No Typology Code available for payment source

## 2018-01-05 DIAGNOSIS — R51 Headache: Secondary | ICD-10-CM | POA: Insufficient documentation

## 2018-01-05 DIAGNOSIS — Y939 Activity, unspecified: Secondary | ICD-10-CM | POA: Diagnosis not present

## 2018-01-05 DIAGNOSIS — Y999 Unspecified external cause status: Secondary | ICD-10-CM | POA: Diagnosis not present

## 2018-01-05 DIAGNOSIS — Y929 Unspecified place or not applicable: Secondary | ICD-10-CM | POA: Insufficient documentation

## 2018-01-05 DIAGNOSIS — M25561 Pain in right knee: Secondary | ICD-10-CM | POA: Diagnosis not present

## 2018-01-05 LAB — POC URINE PREG, ED: PREG TEST UR: NEGATIVE
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
## 2018-06-09 ENCOUNTER — Encounter (HOSPITAL_COMMUNITY): Payer: Self-pay | Admitting: Emergency Medicine

## 2018-06-09 ENCOUNTER — Emergency Department (HOSPITAL_COMMUNITY)
Admission: EM | Admit: 2018-06-09 | Discharge: 2018-06-09 | Disposition: A | Discharge status: Home / Self Care | Payer: Medicaid Other | Attending: Emergency Medicine | Admitting: Emergency Medicine

## 2018-06-09 ENCOUNTER — Other Ambulatory Visit: Payer: Self-pay

## 2018-06-09 DIAGNOSIS — N3000 Acute cystitis without hematuria: Secondary | ICD-10-CM | POA: Insufficient documentation

## 2018-06-09 DIAGNOSIS — R112 Nausea with vomiting, unspecified: Secondary | ICD-10-CM

## 2018-06-09 DIAGNOSIS — Z20828 Contact with and (suspected) exposure to other viral communicable diseases: Secondary | ICD-10-CM

## 2018-06-09 DIAGNOSIS — J111 Influenza due to unidentified influenza virus with other respiratory manifestations: Secondary | ICD-10-CM

## 2018-06-09 DIAGNOSIS — J101 Influenza due to other identified influenza virus with other respiratory manifestations: Secondary | ICD-10-CM | POA: Insufficient documentation

## 2018-06-09 DIAGNOSIS — R1013 Epigastric pain: Secondary | ICD-10-CM

## 2018-06-09 DIAGNOSIS — R69 Illness, unspecified: Secondary | ICD-10-CM

## 2018-06-09 DIAGNOSIS — M791 Myalgia, unspecified site: Secondary | ICD-10-CM

## 2018-06-09 LAB — COMPREHENSIVE METABOLIC PANEL
ALT: 14 U/L (ref 0–44)
ANION GAP: 9 (ref 5–15)
AST: 15 U/L (ref 15–41)
Albumin: 4.1 g/dL (ref 3.5–5.0)
Alkaline Phosphatase: 44 U/L (ref 38–126)
BILIRUBIN TOTAL: 0.7 mg/dL (ref 0.3–1.2)
BUN: 13 mg/dL (ref 6–20)
CO2: 25 mmol/L (ref 22–32)
CREATININE: 0.69 mg/dL (ref 0.44–1.00)
Calcium: 9 mg/dL (ref 8.9–10.3)
Chloride: 106 mmol/L (ref 98–111)
GFR calc non Af Amer: >60 mL/min (ref >60)
Glucose, Bld: 93 mg/dL (ref 70–99)
Potassium: 3.8 mmol/L (ref 3.5–5.1)
Sodium: 140 mmol/L (ref 135–145)
TOTAL PROTEIN: 8.3 g/dL — AB (ref 6.5–8.1)

## 2018-06-09 LAB — URINALYSIS, ROUTINE W REFLEX MICROSCOPIC
Bilirubin Urine: NEGATIVE
GLUCOSE, UA: NEGATIVE mg/dL
Ketones, ur: NEGATIVE mg/dL
Leukocytes, UA: NEGATIVE
NITRITE: POSITIVE — AB
Protein, ur: NEGATIVE mg/dL
SPECIFIC GRAVITY, URINE: 1.026 (ref 1.005–1.030)
pH: 5 (ref 5.0–8.0)

## 2018-06-09 LAB — CBC WITH DIFFERENTIAL/PLATELET
Abs Immature Granulocytes: 0.02 10*3/uL (ref 0.00–0.07)
Basophils Absolute: 0 10*3/uL (ref 0.0–0.1)
Basophils Relative: 0 %
Eosinophils Absolute: 0.1 10*3/uL (ref 0.0–0.5)
Eosinophils Relative: 1 %
HEMATOCRIT: 43.3 % (ref 36.0–46.0)
HEMOGLOBIN: 13.9 g/dL (ref 12.0–15.0)
Immature Granulocytes: 0 %
LYMPHS ABS: 1.1 10*3/uL (ref 0.7–4.0)
LYMPHS PCT: 15 %
MCH: 28.2 pg (ref 26.0–34.0)
MCHC: 32.1 g/dL (ref 30.0–36.0)
MCV: 87.8 fL (ref 80.0–100.0)
MONO ABS: 0.4 10*3/uL (ref 0.1–1.0)
MONOS PCT: 6 %
Neutro Abs: 5.8 10*3/uL (ref 1.7–7.7)
Neutrophils Relative %: 78 %
Platelets: 315 10*3/uL (ref 150–400)
RBC: 4.93 MIL/uL (ref 3.87–5.11)
RDW: 13.1 % (ref 11.5–15.5)
WBC: 7.5 10*3/uL (ref 4.0–10.5)
nRBC: 0 % (ref 0.0–0.2)

## 2018-06-09 LAB — LIPASE, BLOOD: Lipase: 22 U/L (ref 11–51)

## 2018-06-09 LAB — POC URINE PREG, ED: Preg Test, Ur: NEGATIVE

## 2018-06-09 MED ORDER — ALUM & MAG HYDROXIDE-SIMETH 200-200-20 MG/5ML PO SUSP
30.0000 mL | Freq: Once | ORAL | Status: AC
  Administered 2018-06-09: 30 mL via ORAL
  Filled 2018-06-09: qty 30

## 2018-06-09 MED ORDER — OSELTAMIVIR PHOSPHATE 75 MG PO CAPS: 75.0000 mg | ORAL_CAPSULE | Freq: Two times a day (BID) | ORAL | 0 refills | Status: DC

## 2018-06-09 MED ORDER — LIDOCAINE VISCOUS HCL 2 % MT SOLN
15.0000 mL | Freq: Once | OROMUCOSAL | Status: AC
  Administered 2018-06-09: 15 mL via ORAL
  Filled 2018-06-09: qty 15

## 2018-06-09 MED ORDER — ONDANSETRON HCL 4 MG/2ML IJ SOLN
4.0000 mg | Freq: Once | INTRAMUSCULAR | Status: AC
  Administered 2018-06-09: 4 mg via INTRAVENOUS
  Filled 2018-06-09: qty 2

## 2018-06-09 MED ORDER — CEPHALEXIN 500 MG PO CAPS: 500.0000 mg | ORAL_CAPSULE | Freq: Two times a day (BID) | ORAL | 0 refills | Status: AC

## 2018-06-09 MED ORDER — ONDANSETRON HCL 4 MG PO TABS: 4.0000 mg | ORAL_TABLET | Freq: Three times a day (TID) | ORAL | 0 refills | Status: DC | PRN

## 2018-06-09 MED ORDER — SODIUM CHLORIDE 0.9 % IV BOLUS
1000.0000 mL | Freq: Once | INTRAVENOUS | Status: AC
  Administered 2018-06-09: 1000 mL via INTRAVENOUS

## 2018-06-09 NOTE — ED Notes (Signed)
Discharge instructions and prescriptions discussed with Pt. Pt verbalized understanding. Pt stable and ambulatory.   

## 2018-06-09 NOTE — ED Triage Notes (Signed)
Pt reports abdominal pain in the RUQ and LUQ with nausea and emesis x 3 since this morning.

## 2018-06-09 NOTE — ED Notes (Signed)
Per Tramaine in mini lab, POC urine preg is negative.
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
## 2018-06-09 NOTE — Discharge Instructions (Signed)
Based on your known exposure to individuals with influenza and your new nausea, vomiting, chills, and myalgias, you will be treated empirically for likely influenza.  We suspect this is causing your nausea and vomiting symptoms.  We were able to find urinary tract infection today, please take the antibiotics to treat this.  Please stay hydrated.  Please use the nausea medicine to help maintain hydration.  Please follow-up with your primary doctor and rest.  If any symptoms change or worsen, is return to the nearest emergency department.

## 2018-06-11 LAB — URINE CULTURE: Culture: >=100000 — AB

## 2018-06-12 ENCOUNTER — Telehealth: Payer: Self-pay | Admitting: Emergency Medicine

## 2018-06-12 NOTE — Telephone Encounter (Signed)
Post ED Visit - Positive Culture Follow-up  Culture report reviewed by antimicrobial stewardship pharmacist:  []  Enzo Bi, Pharm.D. []  Celedonio Miyamoto, Pharm.D., BCPS AQ-ID []  Garvin Fila, Pharm.D., BCPS []  Georgina Pillion, Pharm.D., BCPS []  [place], [address].D., BCPS, AAHIVP []  Estella Husk, Pharm.D., BCPS, AAHIVP []  Lysle Pearl, PharmD, BCPS []  Phillips Climes, PharmD, BCPS []  Agapito Games, PharmD, BCPS []  Verlan Friends, PharmD Velva Harman PharmD  Positive urine culture Treated with cephalexin, organism sensitive to the same and no further patient follow-up is required at this time.  Berle Mull 06/12/2018, 10:35 AM

## 2018-07-22 ENCOUNTER — Encounter (HOSPITAL_COMMUNITY): Payer: Self-pay | Admitting: Emergency Medicine

## 2018-07-22 ENCOUNTER — Emergency Department (HOSPITAL_COMMUNITY)
Admission: EM | Admit: 2018-07-22 | Discharge: 2018-07-22 | Disposition: A | Discharge status: Home / Self Care | Payer: Self-pay | Attending: Emergency Medicine | Admitting: Emergency Medicine

## 2018-07-22 ENCOUNTER — Emergency Department (HOSPITAL_COMMUNITY): Payer: Self-pay

## 2018-07-22 ENCOUNTER — Other Ambulatory Visit: Payer: Self-pay

## 2018-07-22 DIAGNOSIS — J069 Acute upper respiratory infection, unspecified: Secondary | ICD-10-CM | POA: Insufficient documentation

## 2018-07-22 DIAGNOSIS — B9789 Other viral agents as the cause of diseases classified elsewhere: Secondary | ICD-10-CM

## 2018-07-22 LAB — GROUP A STREP BY PCR: GROUP A STREP BY PCR: NOT DETECTED

## 2018-07-22 MED ORDER — LIDOCAINE VISCOUS HCL 2 % MT SOLN
15.0000 mL | Freq: Once | OROMUCOSAL | Status: AC
  Administered 2018-07-22: 15 mL via OROMUCOSAL
  Filled 2018-07-22: qty 15

## 2018-07-22 MED ORDER — ACETAMINOPHEN 325 MG PO TABS
650.0000 mg | ORAL_TABLET | Freq: Once | ORAL | Status: AC
  Administered 2018-07-22: 650 mg via ORAL
  Filled 2018-07-22: qty 2

## 2018-07-22 MED ORDER — DM-GUAIFENESIN ER 30-600 MG PO TB12: 1.0000 | ORAL_TABLET | Freq: Two times a day (BID) | ORAL | 0 refills | Status: DC

## 2018-07-22 NOTE — ED Provider Notes (Signed)
[facility] EMERGENCY DEPARTMENT Provider Note   CSN: [id number] Arrival date & time: 07/22/18  1244    History   Chief Complaint Chief Complaint  Patient presents with   Cough   Sore Throat    HPI April Crane is a 28 y.o. female without history of immunocompromising diseases presenting today for 3-day history of cough and sore throat.  Patient reports that her symptoms gradual onset 3 days ago she describes a intermittent cough productive with clear sputum.  She denies chest pain, shortness of breath or hemoptysis.  Patient has not taken any medications prior to arrival for her cough.   Additionally patient describes a sore throat as a mild intensity bilateral "dry feeling" constant worsened with swallowing and with cough without alleviating factor, she has been taking medication for sore throat prior to arrival.  Patient denies fever/chills, difficulty breathing/swallowing, recent travel or exposure to individuals at high risk for COVID-19.    HPI  Past Medical History:  Diagnosis Date   No pertinent past medical history    Obstruction of colon [facility])     Patient Active Problem List   Diagnosis Date Noted   Chest pain    Abdominal pain 07/14/2015   Generalized weakness 07/14/2015   AKI (acute kidney injury) (HCC) 07/13/2015   Acute renal failure (ARF) (HCC) 07/13/2015    Past Surgical History:  Procedure Laterality Date   ANTERIOR CRUCIATE LIGAMENT REPAIR     ANTERIOR CRUCIATE LIGAMENT REPAIR       OB History    Gravida  0   Para  0   Term      Preterm      AB      Living        SAB      TAB      Ectopic      Multiple      Live Births               Home Medications    Prior to Admission medications   Medication Sig Start Date End Date Taking? Authorizing Provider  dextromethorphan-guaiFENesin (MUCINEX DM) 30-600 MG 12hr tablet Take 1 tablet by mouth 2 (two) times daily. 07/22/18   Harlene Salts A,  PA-C  ondansetron (ZOFRAN) 4 MG tablet Take 1 tablet (4 mg total) by mouth every 8 (eight) hours as needed for nausea or vomiting. 06/09/18   Tegeler, Canary Brim, MD  oseltamivir (TAMIFLU) 75 MG capsule Take 1 capsule (75 mg total) by mouth every 12 (twelve) hours. 06/09/18   Tegeler, Canary Brim, MD    Family History Family History  Problem Relation Age of Onset   Asthma Sister    Cancer Maternal Grandmother     Social History Social History   Tobacco Use   Smoking status: Never Smoker   Smokeless tobacco: Never Used  Substance Use Topics   Alcohol use: No   Drug use: No     Allergies   Other   Review of Systems Review of Systems  Constitutional: Negative.  Negative for appetite change, chills and fever.  HENT: Positive for rhinorrhea and sore throat. Negative for facial swelling, sinus pressure, sinus pain, trouble swallowing and voice change.   Respiratory: Positive for cough. Negative for shortness of breath and wheezing.   Cardiovascular: Negative.  Negative for chest pain.  Gastrointestinal: Negative.  Negative for abdominal pain, diarrhea, nausea and vomiting.  Musculoskeletal: Negative.  Negative for arthralgias, myalgias, neck pain and neck stiffness.  Skin: Negative.  Negative for rash.  Neurological: Negative.  Negative for weakness and headaches.   Physical Exam Updated Vital Signs BP 103/70 (BP Location: Right Arm)    Pulse 82    Temp 99.2 F (37.3 C) (Oral)    Resp 16    Ht 5' 0.5" (1.537 m)    Wt 83 kg    SpO2 98%    BMI 35.15 kg/m   Physical Exam Constitutional:      General: She is not in acute distress.    Appearance: Normal appearance. She is well-developed. She is not ill-appearing or diaphoretic.  HENT:     Head: Normocephalic and atraumatic.     Jaw: There is normal jaw occlusion. No trismus.     Right Ear: Tympanic membrane, ear canal and external ear normal.     Left Ear: Tympanic membrane, ear canal and external ear normal.      Nose: Rhinorrhea present. Rhinorrhea is clear.     Right Sinus: No maxillary sinus tenderness or frontal sinus tenderness.     Left Sinus: No maxillary sinus tenderness or frontal sinus tenderness.     Mouth/Throat:     Mouth: Mucous membranes are moist.     Pharynx: Oropharynx is clear. Uvula midline.     Comments: The patient has normal phonation and is in control of secretions. No stridor.  Midline uvula without edema. Soft palate rises symmetrically. No tonsillar erythema, swelling or exudates. Tongue protrusion is normal, floor of mouth is soft. No trismus. No creptius on neck palpation. No gingival erythema or fluctuance noted. Mucus membranes moist.  Eyes:     General: Vision grossly intact. Gaze aligned appropriately.     Conjunctiva/sclera: Conjunctivae normal.     Pupils: Pupils are equal, round, and reactive to light.  Neck:     Musculoskeletal: Full passive range of motion without pain, normal range of motion and neck supple.     Trachea: Trachea and phonation normal. No tracheal tenderness or tracheal deviation.     Meningeal: Brudzinski's sign absent.  Cardiovascular:     Rate and Rhythm: Normal rate and regular rhythm.     Heart sounds: Normal heart sounds.  Pulmonary:     Effort: Pulmonary effort is normal. No accessory muscle usage or respiratory distress.     Breath sounds: Normal breath sounds and air entry. No wheezing or rhonchi.  Chest:     Chest wall: No tenderness.  Abdominal:     General: There is no distension.     Palpations: Abdomen is soft.     Tenderness: There is no abdominal tenderness. There is no guarding or rebound.  Musculoskeletal: Normal range of motion.     Right lower leg: Normal. She exhibits no tenderness. No edema.     Left lower leg: Normal. She exhibits no tenderness. No edema.  Skin:    General: Skin is warm and dry.  Neurological:     Mental Status: She is alert.     GCS: GCS eye subscore is 4. GCS verbal subscore is 5. GCS motor  subscore is 6.     Comments: Speech is clear and goal oriented, follows commands Major Cranial nerves without deficit, no facial droop Moves extremities without ataxia, coordination intact Normal gait  Psychiatric:        Behavior: Behavior normal.    ED Treatments / Results  Labs (all labs ordered are listed, but only abnormal results are displayed) Labs Reviewed  GROUP A STREP BY PCR  EKG None  Radiology Dg Chest 2 View  Result Date: 07/22/2018 CLINICAL DATA:  Cough for 3 days EXAM: CHEST - 2 VIEW COMPARISON:  July 14, 2015 FINDINGS: No edema or consolidation. Heart size and pulmonary vascularity are normal. No adenopathy. No bone lesions. IMPRESSION: No edema or consolidation. Electronically Signed   By: Bretta Bang III M.D.   On: 07/22/2018 14:06    Procedures Procedures (including critical care time)  Medications Ordered in ED Medications  acetaminophen (TYLENOL) tablet 650 mg (650 mg Oral Given 07/22/18 1413)  lidocaine (XYLOCAINE) 2 % viscous mouth solution 15 mL (15 mLs Mouth/Throat Given 07/22/18 1413)     Initial Impression / Assessment and Plan / ED Course  I have reviewed the triage vital signs and the nursing notes.  Pertinent labs & imaging results that were available during my care of the patient were reviewed by me and considered in my medical decision making (see chart for details).     28 year old female without history of immunocompromising diseases presents today for 3-day history of URI-like symptoms.  Physical examination today overall reassuring, well-appearing patient in no acute distress.  Lungs clear to auscultation bilaterally.  Abdomen soft/nontender palpation tolerating p.o. without difficulty.  No signs of Ludwig's angina, retropharyngeal abscess, peritonsillar abscess or other deep tissue infections of the head or neck.  Airway is grossly patent patient is able to swallow without difficulty.  Chest x-ray:  IMPRESSION: No edema or  consolidation. Strep test negative  Symptoms likely viral in etiology.  Discussed with patient antibiotics not indicated for viral infection.  Will pursue symptomatic treatment including Mucinex DM, water rehydration and anti-inflammatories Tylenol.  Patient has refused pregnancy test today as she has the Nexplanon implant, feel this is reasonable.  Additionally patient without high risk travel behaviors, no known risk for individual contact for COVID-19.  Discussed with patient this may be influenza, she is greater than 72 hours and symptom onset, Tamiflu is not indicated at this time, patient states understanding and is agreeable.  At this time there does not appear to be any evidence of an acute emergency medical condition and the patient appears stable for discharge with appropriate outpatient follow up. Diagnosis was discussed with patient who verbalizes understanding of care plan and is agreeable to discharge. I have discussed return precautions with patient who verbalizes understanding of return precautions. Patient encouraged to follow-up with their PCP. All questions answered.  Patient has been discharged in good condition.   Note: Portions of this report may have been transcribed using voice recognition software. Every effort was made to ensure accuracy; however, inadvertent computerized transcription errors may still be present. Final Clinical Impressions(s) / ED Diagnoses   Final diagnoses:  Viral URI with cough    ED Discharge Orders         Ordered    dextromethorphan-guaiFENesin [facility] DM) 30-600 MG 12hr tablet  2 times daily     07/22/18 1504           Elizabeth Palau 07/22/18 1542    Arby Barrette, MD 07/23/18 1208

## 2018-07-22 NOTE — Discharge Instructions (Addendum)
You have been diagnosed today with Viral Upper Respiratory Tract infection with cough.  At this time there does not appear to be the presence of an emergent medical condition, however there is always the potential for conditions to change. Please read and follow the below instructions.  Please return to the Emergency Department immediately for any new or worsening symptoms or if your symptoms do not improve within 5 days. Please be sure to follow up with your Primary Care Provider within one week regarding your visit today; please call their office to schedule an appointment even if you are feeling better for a follow-up visit. You may use the cough medicine Mucinex DM as prescribed to help with your cough. Do not take this medicine if your believe you may be pregnant.  Please drink plenty of water and get plenty of rest to help with your symptoms. You may use Tylenol as directed on the packaging to help with your symptoms.  Get help right away if: You have shortness of breath that gets worse. You have very bad or constant: Headache. Ear pain. Pain in your forehead, behind your eyes, and over your cheekbones (sinus pain). Chest pain. You have long-lasting (chronic) lung disease along with any of these: Wheezing. Long-lasting cough. Coughing up blood. A change in your usual mucus. You have a stiff neck. You have changes in your: Vision. Hearing. Thinking. Mood. Get help right away if: You cough up blood. You have difficulty breathing. Your heartbeat is very fast. Any new or concerning symptoms.  Please read the additional information packets attached to your discharge summary.  Do not take your medicine if  develop an itchy rash, swelling in your mouth or lips, or difficulty breathing.

## 2018-07-22 NOTE — ED Triage Notes (Signed)
Pt. Stated, April Crane had a sore throat and cough for 3 days.

## 2018-11-14 ENCOUNTER — Encounter (HOSPITAL_COMMUNITY): Payer: Self-pay | Admitting: Emergency Medicine

## 2018-11-14 ENCOUNTER — Ambulatory Visit (HOSPITAL_COMMUNITY)
Admission: EM | Admit: 2018-11-14 | Discharge: 2018-11-14 | Disposition: A | Discharge status: Home / Self Care | Payer: PRIVATE HEALTH INSURANCE | Attending: Emergency Medicine | Admitting: Emergency Medicine

## 2018-11-14 ENCOUNTER — Other Ambulatory Visit: Payer: Self-pay

## 2018-11-14 DIAGNOSIS — M25552 Pain in left hip: Secondary | ICD-10-CM

## 2018-11-14 MED ORDER — NAPROXEN 500 MG PO TABS: 500.0000 mg | ORAL_TABLET | Freq: Two times a day (BID) | ORAL | 0 refills | Status: DC

## 2018-11-14 NOTE — ED Triage Notes (Signed)
Pt here for left hip pain starting this afternoon

## 2018-11-14 NOTE — Discharge Instructions (Signed)
Naprosyn twice daily Follow up if pain returning or worsening

## 2018-11-16 NOTE — ED Provider Notes (Signed)
[facility]    CSN: [id number] Arrival date & time: 11/14/18  1833      History   Chief Complaint Chief Complaint  Patient presents with  . Hip Pain    HPI April Crane is a 28 y.o. female no significant past medical history presenting today for evaluation of left hip pain.  Patient states that this afternoon she developed hip pain while at work.  States that with any movement and walking she was having pain within her left hip.  Denies any injury, fall.  Does note that she recently returned to work so she is on her feet more than normal.  States that the pain has resolved.  Denies previous history of issues with hip.  She did not take anything for pain.  Denies numbness or tingling.  Denies weakness in leg.  Denies issues with urination or bowel movements.  HPI  Past Medical History:  Diagnosis Date  . No pertinent past medical history   . Obstruction of colon [facility](HCC)     Patient Active Problem List   Diagnosis Date Noted  . Chest pain   . Abdominal pain 07/14/2015  . Generalized weakness 07/14/2015  . AKI (acute kidney injury) (HCC) 07/13/2015  . Acute renal failure (ARF) (HCC) 07/13/2015    Past Surgical History:  Procedure Laterality Date  . ANTERIOR CRUCIATE LIGAMENT REPAIR    . ANTERIOR CRUCIATE LIGAMENT REPAIR      OB History    Gravida  0   Para  0   Term      Preterm      AB      Living        SAB      TAB      Ectopic      Multiple      Live Births               Home Medications    Prior to Admission medications   Medication Sig Start Date End Date Taking? Authorizing Provider  naproxen (NAPROSYN) 500 MG tablet Take 1 tablet (500 mg total) by mouth 2 (two) times daily. 11/14/18   ,  C, PA-C  ondansetron (ZOFRAN) 4 MG tablet Take 1 tablet (4 mg total) by mouth every 8 (eight) hours as needed for nausea or vomiting. 06/09/18   Tegeler, Canary Brimhristopher J, MD    Family History Family History  Problem Relation  Age of Onset  . Asthma Sister   . Cancer Maternal Grandmother     Social History Social History   Tobacco Use  . Smoking status: Never Smoker  . Smokeless tobacco: Never Used  Substance Use Topics  . Alcohol use: No  . Drug use: No     Allergies   Other   Review of Systems Review of Systems  Constitutional: Negative for fatigue and fever.  Eyes: Negative for visual disturbance.  Respiratory: Negative for shortness of breath.   Cardiovascular: Negative for chest pain.  Gastrointestinal: Negative for abdominal pain, nausea and vomiting.  Genitourinary: Negative for decreased urine volume and difficulty urinating.  Musculoskeletal: Positive for arthralgias. Negative for joint swelling.  Skin: Negative for color change, rash and wound.  Neurological: Negative for dizziness, weakness, light-headedness and headaches.     Physical Exam Triage Vital Signs ED Triage Vitals [11/14/18 1904]  Enc Vitals Group     BP 134/69     Pulse Rate 85     Resp 18     Temp 98.3  F (36.8 C)     Temp Source Oral     SpO2 100 %     Weight      Height      Head Circumference      Peak Flow      Pain Score 8     Pain Loc      Pain Edu?      Excl. in [place]?    No data found.  Updated Vital Signs BP 134/69 (BP Location: Right Arm)   Pulse 85   Temp 98.3 F (36.8 C) (Oral)   Resp 18   SpO2 100%   Visual Acuity Right Eye Distance:   Left Eye Distance:   Bilateral Distance:    Right Eye Near:   Left Eye Near:    Bilateral Near:     Physical Exam Vitals signs and nursing note reviewed.  Constitutional:      Appearance: She is well-developed.     Comments: No acute distress  HENT:     Head: Normocephalic and atraumatic.     Nose: Nose normal.  Eyes:     Conjunctiva/sclera: Conjunctivae normal.  Neck:     Musculoskeletal: Neck supple.  Cardiovascular:     Rate and Rhythm: Normal rate.  Pulmonary:     Effort: Pulmonary effort is normal. No respiratory distress.   Abdominal:     General: There is no distension.  Musculoskeletal: Normal range of motion.     Comments: Nontender to palpation of thoracic and lumbar spine midline, nontender throughout bilateral lumbar musculature, nontender throughout bony prominences of left hip extending into thigh.  Full active range of motion of hip, strength 5/5 and equal bilaterally, patellar reflexes 2+ bilaterally  Skin:    General: Skin is warm and dry.  Neurological:     Mental Status: She is alert and oriented to person, place, and time.      UC Treatments / Results  Labs (all labs ordered are listed, but only abnormal results are displayed) Labs Reviewed - No data to display  EKG   Radiology No results found.  Procedures Procedures (including critical care time)  Medications Ordered in UC Medications - No data to display  Initial Impression / Assessment and Plan / UC Course  I have reviewed the triage vital signs and the nursing notes.  Pertinent labs & imaging results that were available during my care of the patient were reviewed by me and considered in my medical decision making (see chart for details).     Patient with brief hip pain today that has resolved without taking any medicines.  No mechanism of injury.  Most likely inflammatory.  Will recommend continued monitoring for return of pain.  May use Naprosyn as needed.  Has had previous AKI, but kidney function has been stable over the past 3 years.  Follow-up if symptoms returning or worsening.Discussed strict return precautions. Patient verbalized understanding and is agreeable with plan.  Final Clinical Impressions(s) / UC Diagnoses   Final diagnoses:  Acute pain of left hip     Discharge Instructions     Naprosyn twice daily Follow up if pain returning or worsening   ED Prescriptions    Medication Sig Dispense Auth. Provider   naproxen (NAPROSYN) 500 MG tablet Take 1 tablet (500 mg total) by mouth 2 (two) times daily. 30  tablet , Clint C, PA-C     Controlled Substance Prescriptions [place] Controlled Substance Registry consulted? Not Applicable   , Frankenmuth C, PA-C  11/16/18 1006  

## 2018-11-20 ENCOUNTER — Emergency Department (HOSPITAL_COMMUNITY)
Admission: EM | Admit: 2018-11-20 | Discharge: 2018-11-20 | Disposition: A | Discharge status: Home / Self Care | Payer: PRIVATE HEALTH INSURANCE | Attending: Emergency Medicine | Admitting: Emergency Medicine

## 2018-11-20 ENCOUNTER — Other Ambulatory Visit: Payer: Self-pay

## 2018-11-20 DIAGNOSIS — J029 Acute pharyngitis, unspecified: Secondary | ICD-10-CM | POA: Insufficient documentation

## 2018-11-20 DIAGNOSIS — R590 Localized enlarged lymph nodes: Secondary | ICD-10-CM | POA: Diagnosis not present

## 2018-11-20 LAB — GROUP A STREP BY PCR: Group A Strep by PCR: NOT DETECTED

## 2018-11-20 MED ORDER — LIDOCAINE VISCOUS HCL 2 % MT SOLN
15.0000 mL | Freq: Once | OROMUCOSAL | Status: AC
  Administered 2018-11-20: 09:00:00 15 mL via OROMUCOSAL
  Filled 2018-11-20: qty 15

## 2018-11-20 NOTE — ED Provider Notes (Signed)
[facility]   CSN: [id number] Arrival date & time: 11/20/18  0801    History   Chief Complaint Chief Complaint  Patient presents with  . Sore Throat    HPI Craig StaggersKeisha L Crane is a 28 y.o. female with no significant past medical history presents with a constant sore throat onset yesterday. Patient reports she has taken OTC cold and flu medications with partial relief. Patient reports she has had strep throat in the past and works at a daycare. Patient denies any known sick contacts or exposure to COVID-19. Patient denies fever, cough, congestion, nausea, vomiting, diarrhea, or abdominal pain. Patient denies shortness of breath or chest pain. Patient denies trouble swallowing, voice change, dental problem, or facial swelling. Patient reports she is eating and drinking at baseline.      HPI  Past Medical History:  Diagnosis Date  . No pertinent past medical history   . Obstruction of colon [facility](HCC)     Patient Active Problem List   Diagnosis Date Noted  . Chest pain   . Abdominal pain 07/14/2015  . Generalized weakness 07/14/2015  . AKI (acute kidney injury) (HCC) 07/13/2015  . Acute renal failure (ARF) (HCC) 07/13/2015    Past Surgical History:  Procedure Laterality Date  . ANTERIOR CRUCIATE LIGAMENT REPAIR    . ANTERIOR CRUCIATE LIGAMENT REPAIR       OB History    Gravida  0   Para  0   Term      Preterm      AB      Living        SAB      TAB      Ectopic      Multiple      Live Births               Home Medications    Prior to Admission medications   Medication Sig Start Date End Date Taking? Authorizing Provider  naproxen (NAPROSYN) 500 MG tablet Take 1 tablet (500 mg total) by mouth 2 (two) times daily. 11/14/18   Wieters, Hallie C, PA-C  ondansetron (ZOFRAN) 4 MG tablet Take 1 tablet (4 mg total) by mouth every 8 (eight) hours as needed for nausea or vomiting. 06/09/18   Tegeler, Canary Brimhristopher  J, MD    Family History Family History  Problem Relation Age of Onset  . Asthma Sister   . Cancer Maternal Grandmother     Social History Social History   Tobacco Use  . Smoking status: Never Smoker  . Smokeless tobacco: Never Used  Substance Use Topics  . Alcohol use: No  . Drug use: No     Allergies   Other   Review of Systems Review of Systems  Constitutional: Negative for activity change, appetite change, chills, diaphoresis, fatigue and fever.  HENT: Positive for sore throat. Negative for congestion, dental problem, drooling, ear pain, facial swelling, rhinorrhea, sinus pain, sneezing, trouble swallowing and voice change.   Eyes: Negative for pain and redness.  Respiratory: Negative for cough and shortness of breath.   Cardiovascular: Negative for chest pain.  Gastrointestinal: Negative for abdominal pain, diarrhea, nausea and vomiting.  Musculoskeletal: Negative for neck pain and neck stiffness.  Skin: Negative for rash.  Allergic/Immunologic: Negative for immunocompromised state.  Neurological: Negative for headaches.     Physical Exam Updated Vital Signs BP 129/87   Pulse 90   Temp 98.5 F (36.9 C) (Oral)   Resp 18  SpO2 98%   Physical Exam Vitals signs and nursing note reviewed.  Constitutional:      General: She is not in acute distress.    Appearance: She is well-developed. She is not diaphoretic.  HENT:     Head: Normocephalic and atraumatic.     Right Ear: Tympanic membrane, ear canal and external ear normal. No middle ear effusion.     Left Ear: Tympanic membrane, ear canal and external ear normal.  No middle ear effusion.     Nose: Mucosal edema present. No congestion or rhinorrhea.     Mouth/Throat:     Mouth: Mucous membranes are moist.     Dentition: Normal dentition.     Pharynx: Uvula midline. Posterior oropharyngeal erythema present. No oropharyngeal exudate.     Tonsils: No tonsillar exudate or tonsillar abscesses. 1+ on the  right. 1+ on the left.     Comments: Patient is speaking in full sentences without difficulty. No signs of drooling or muffled voice. Eyes:     General:        Right eye: No discharge.        Left eye: No discharge.     Conjunctiva/sclera: Conjunctivae normal.  Neck:     Musculoskeletal: Normal range of motion and neck supple.  Cardiovascular:     Rate and Rhythm: Normal rate and regular rhythm.     Heart sounds: Normal heart sounds. No murmur. No friction rub. No gallop.   Pulmonary:     Effort: Pulmonary effort is normal. No respiratory distress.     Breath sounds: Normal breath sounds. No wheezing or rales.  Abdominal:     Palpations: Abdomen is soft.     Tenderness: There is no abdominal tenderness.  Musculoskeletal: Normal range of motion.  Lymphadenopathy:     Cervical: Cervical adenopathy present.  Skin:    Findings: No rash.  Neurological:     Mental Status: She is alert and oriented to person, place, and time.    ED Treatments / Results  Labs (all labs ordered are listed, but only abnormal results are displayed) Labs Reviewed  GROUP A STREP BY PCR    EKG None  Radiology No results found.  Procedures Procedures (including critical care time)  Medications Ordered in ED Medications  lidocaine (XYLOCAINE) 2 % viscous mouth solution 15 mL (has no administration in time range)     Initial Impression / Assessment and Plan / ED Course  I have reviewed the triage vital signs and the nursing notes.  Pertinent labs & imaging results that were available during my care of the patient were reviewed by me and considered in my medical decision making (see chart for details).       Patient presents with a sore throat. Patient had mild tonsillar edema, posterior pharyngeal erythema, and anterior cervical lymphadenopathy. Centor score is a 3. Ordered GAS test. GAS test was negative. Patient is stable in no acute distress. Suspect symptoms are viral in nature and  recommended supportive therapy. Discussed return precautions. Patient states she understands and agrees with plan.   Final Clinical Impressions(s) / ED Diagnoses   Final diagnoses:  Sore throat    ED Discharge Orders    None       Arville Lime, [place] 11/20/18 0935    Isla Pence, MD 11/20/18 [phone]

## 2018-11-20 NOTE — ED Triage Notes (Signed)
Pt c/o sore throat that began yesterday ; denies any  fevers

## 2018-11-20 NOTE — ED Notes (Signed)
Patient verbalizes understanding of discharge instructions. Opportunity for questioning and answering were provided.  patient discharged from ED.  

## 2018-11-20 NOTE — Discharge Instructions (Addendum)
You have been seen today for sore throat. Please read and follow all provided instructions.   1. Medications: tylenol for sore throat, usual home medications 2. Treatment: rest, drink plenty of fluids 3. Follow Up: Please follow up with your primary doctor in 2-5 days for discussion of your diagnoses and further evaluation after today's visit; if you do not have a primary care doctor use the resource guide provided to find one; Please return to the ER for any new or worsening symptoms. Please obtain all of your results from medical records or have your doctors office obtain the results - share them with your doctor - you should be seen at your doctors office. Call today to arrange your follow up.   Take medications as prescribed. Please review all of the medicines and only take them if you do not have an allergy to them. Return to the emergency room for worsening condition or new concerning symptoms. Follow up with your regular doctor. If you don't have a regular doctor use one of the numbers below to establish a primary care doctor.  Please be aware that if you are taking birth control pills, taking other prescriptions, ESPECIALLY ANTIBIOTICS may make the birth control ineffective - if this is the case, either do not engage in sexual activity or use alternative methods of birth control such as condoms until you have finished the medicine and your family doctor says it is OK to restart them. If you are on a blood thinner such as COUMADIN, be aware that any other medicine that you take may cause the coumadin to either work too much, or not enough - you should have your coumadin level rechecked in next 7 days if this is the case.  ?  It is also a possibility that you have an allergic reaction to any of the medicines that you have been prescribed - Everybody reacts differently to medications and while MOST people have no trouble with most medicines, you may have a reaction such as nausea, vomiting, rash,  swelling, shortness of breath. If this is the case, please stop taking the medicine immediately and contact your physician.  ?  You should return to the ER if you develop severe or worsening symptoms.   Emergency Department Resource Guide 1) Find a Doctor and Pay Out of Pocket Although you won't have to find out who is covered by your insurance plan, it is a good idea to ask around and get recommendations. You will then need to call the office and see if the doctor you have chosen will accept you as a new patient and what types of options they offer for patients who are self-pay. Some doctors offer discounts or will set up payment plans for their patients who do not have insurance, but you will need to ask so you aren't surprised when you get to your appointment.  2) Contact Your Local Health Department Not all health departments have doctors that can see patients for sick visits, but many do, so it is worth a call to see if yours does. If you don't know where your local health department is, you can check in your phone book. The CDC also has a tool to help you locate your state's health department, and many state websites also have listings of all of their local health departments.  3) Find a [facility] If your illness is not likely to be very severe or complicated, you may want to try a walk in clinic. These are  popping up all over the country in pharmacies, drugstores, and shopping centers. They're usually staffed by nurse practitioners or physician assistants that have been trained to treat common illnesses and complaints. They're usually fairly quick and inexpensive. However, if you have serious medical issues or chronic medical problems, these are probably not your best option.  No Primary Care Doctor: Call Health Connect at  [phone] - they can help you locate a primary care doctor that  accepts your insurance, provides certain services, etc. Physician Referral Service-  [phone]  Chronic Pain Problems: Organization         Address  Phone   Notes  [facility]  [phone] Patients need to be referred by their primary care doctor.   Medication Assistance: Organization         Address  Phone   Notes  [facility] Medication [facility] [address] [place], [place] [address] [phone] --Must be a resident of [facility] -- Must have NO insurance coverage whatsoever (no Medicaid/ Medicare, etc.) -- The pt. MUST have a primary care doctor that directs their care regularly and follows them in the community   [facility]  [phone]   [employer]  [phone]    Agencies that provide inexpensive medical care: Organization         Address  Phone   Notes  [place]  [phone]   [facility]    [phone]   [facility] [place], [place] [address] [phone]   [place] [address], [place] [phone]   [facility]    [phone]   [facility]    [phone]   [place] [facility] [address], [place] Phone:  [phone], Fax:  [phone] Hours of Operation:  9 am - 6 pm, M-F.  Also accepts Medicaid/Medicare and self-pay.  [facility] for [place] [address], [address], [place] Phone: [phone], Fax: [phone]. Hours of Operation:  8:30 am - 5:30 pm, M-F.  Also accepts Medicaid and self-pay.  [facility] [address] [place] Phone: [phone]   [place], [place], [place] [phone], Ext. [phone] Mondays & Thursdays: 7-9 AM.  First 15 patients are seen on a first come, first serve basis.    [place] Providers:  Organization         Address  Phone   Notes  [facility] [address],  [place] [phone] Also accepts self-pay patients.  [facility] [address], [place]  [phone]   [place], [address], [place] [phone]   [facility] [address] [place] [phone]   Lucianne Lei [address], [place]   [phone] Only accepts [place] patients after they have their name applied to their card.   Self-Pay (no insurance) in [facility]:  Organization         Address  Phone   Notes  Sickle Cell Patients, [facility] [place] [phone]   [facility] [place] [phone]   [facility]  [address] [place]  [address], [place] [phone]   [facility]/Dr. Osei-Bonsu  [address], [place] or [address], [place] [phone] Phone number for both [place] and [place] locations is the same.  [facility] [address], [place] [phone]   [facility] [address] [place] or [address] [phone]   [facility] [address] [place] [phone], phone; [phone], fax Sees patients 1st and 3rd Saturday of every month.  Must not qualify for public or private insurance (i.e. Medicaid, Medicare, [place] Health Choice, Veterans' Benefits)  Household income should be no more than 200% of the poverty level The clinic cannot treat you if you are pregnant or think you are pregnant  Sexually transmitted diseases are not treated at the clinic.

## 2019-01-29 ENCOUNTER — Encounter (HOSPITAL_COMMUNITY): Payer: Self-pay

## 2019-01-29 ENCOUNTER — Ambulatory Visit (HOSPITAL_COMMUNITY)
Admission: EM | Admit: 2019-01-29 | Discharge: 2019-01-29 | Disposition: A | Discharge status: Home / Self Care | Payer: PRIVATE HEALTH INSURANCE | Attending: Family Medicine | Admitting: Family Medicine

## 2019-01-29 ENCOUNTER — Other Ambulatory Visit: Payer: Self-pay

## 2019-01-29 DIAGNOSIS — M67432 Ganglion, left wrist: Secondary | ICD-10-CM

## 2019-01-29 DIAGNOSIS — M25872 Other specified joint disorders, left ankle and foot: Secondary | ICD-10-CM | POA: Diagnosis not present

## 2019-01-29 MED ORDER — PREDNISONE 5 MG PO TABS: ORAL_TABLET | ORAL | 0 refills | Status: DC

## 2019-01-29 NOTE — ED Provider Notes (Signed)
[place]    CSN: [id number] Arrival date & time: 01/29/19  1918      History   Chief Complaint Chief Complaint  Patient presents with  . Ankle Pain    Left  . Wrist Pain    Left    HPI ATHALENE KOLLE is a 28 y.o. female.   She is presenting with left wrist pain and left ankle pain.  She has a ganglion cyst over the dorsal component of the left wrist.  This causes pain when she is picking anything up.  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  The pain is localized to the dorsum of the wrist.  Denies any numbness or tingling.  The pain is intermittent in nature.  It can be sharp and severe.  Denies any inciting event or trauma.  The left ankle pain is occurring over the anterior medial aspect.  It seems to be worse at the end of the day.  Denies any specific inciting event or trauma.  Has not tried anything for the pain.  Pain is sharp and stabbing.  Pain can be mild to moderate.  HPI  Past Medical History:  Diagnosis Date  . No pertinent past medical history   . Obstruction of colon [facility])     Patient Active Problem List   Diagnosis Date Noted  . Chest pain   . Abdominal pain 07/14/2015  . Generalized weakness 07/14/2015  . AKI (acute kidney injury) ([place]) 07/13/2015  . Acute renal failure (ARF) ([place]) 07/13/2015    Past Surgical History:  Procedure Laterality Date  . ANTERIOR CRUCIATE LIGAMENT REPAIR    . ANTERIOR CRUCIATE LIGAMENT REPAIR      OB History    Gravida  0   Para  0   Term      Preterm      AB      Living        SAB      TAB      Ectopic      Multiple      Live Births               Home Medications    Prior to Admission medications   Medication Sig Start Date End Date Taking? Authorizing Provider  predniSONE (DELTASONE) 5 MG tablet Take 6 pills for first day, 5 pills second day, 4 pills third day, 3 pills fourth day, 2 pills the fifth day, and 1 pill sixth day. 01/29/19   Rosemarie Ax, MD   Pseudoephedrine-APAP-DM (TYLENOL COLD/FLU SEVERE DAY PO) Take 2 tablets by mouth 3 (three) times daily as needed (cold symptoms).    [provider]    Family History Family History  Problem Relation Age of Onset  . Asthma Sister   . Cancer Maternal Grandmother     Social History Social History   Tobacco Use  . Smoking status: Never Smoker  . Smokeless tobacco: Never Used  Substance Use Topics  . Alcohol use: No  . Drug use: No     Allergies   Ibuprofen and Other   Review of Systems Review of Systems  Constitutional: Negative for fever.  HENT: Negative for congestion.   Respiratory: Negative for cough.   Cardiovascular: Negative for chest pain.  Gastrointestinal: Negative for abdominal pain.  Musculoskeletal: Negative for gait problem.  Skin: Negative for color change.  Neurological: Negative for weakness.  Hematological: Negative for adenopathy.     Physical  Exam Triage Vital Signs ED Triage Vitals  Enc Vitals Group     BP 01/29/19 1959 131/86     Pulse Rate 01/29/19 1959 86     Resp 01/29/19 1959 18     Temp 01/29/19 1959 98.5 F (36.9 C)     Temp Source 01/29/19 1959 Oral     SpO2 01/29/19 1959 99 %     Weight --      Height --      Head Circumference --      Peak Flow --      Pain Score 01/29/19 2000 6     Pain Loc --      Pain Edu? --      Excl. in GC? --    No data found.  Updated Vital Signs BP 131/86 (BP Location: Left Arm)   Pulse 86   Temp 98.5 F (36.9 C) (Oral)   Resp 18   SpO2 99%   Visual Acuity Right Eye Distance:   Left Eye Distance:   Bilateral Distance:    Right Eye Near:   Left Eye Near:    Bilateral Near:     Physical Exam Gen: NAD, alert, cooperative with exam, well-appearing ENT: normal lips, normal nasal mucosa,  Eye: normal EOM, normal conjunctiva and lids CV:  no edema, +2 pedal pulses   Resp: no accessory muscle use, non-labored,   Skin: no rashes, no areas of induration  Neuro: normal tone,  normal sensation to touch Psych:  normal insight, alert and oriented MSK:  Left wrist: Ganglion cyst occurring over the dorsal wrist. Normal finger strength to resistance with abduction and abduction. Normal grip strength. Left ankle: Tenderness to palpation over the anterior medial ankle joint. No obvious effusion. Normal range of motion. Pes planus. Normal strength resistance. Neurovascularly intact   UC Treatments / Results  Labs (all labs ordered are listed, but only abnormal results are displayed) Labs Reviewed - No data to display  EKG   Radiology No results found.  Procedures Procedures (including critical care time)  Medications Ordered in UC Medications - No data to display  Initial Impression / Assessment and Plan / UC Course  I have reviewed the triage vital signs and the nursing notes.  Pertinent labs & imaging results that were available during my care of the patient were reviewed by me and considered in my medical decision making (see chart for details).     Rosalee is a 28 year old female is presenting with a ganglion cyst on the left wrist and ankle impingement on the left ankle.  Reports she had to be admitted for an AKI for taking ibuprofen.  Will provide prednisone.  Counseled on draining the cyst.  She will follow-up in order to have this done.  She was also benefit likely from scaphoid pads or supportive insoles to help with ankle impingement.  Counseled supportive care.  Given occasions follow-up and return.  Final Clinical Impressions(s) / UC Diagnoses   Final diagnoses:  Ankle impingement syndrome, left  Ganglion cyst of dorsum of left wrist     Discharge Instructions     Please follow up to have your cyst drained and to get support for your foot  Please try ice on the ankle  Please try the medicine  Please follow up if your symptoms fail to improve or worsen.     ED Prescriptions    Medication Sig Dispense Auth. Provider    predniSONE (DELTASONE) 5 MG tablet Take 6 pills for  first day, 5 pills second day, 4 pills third day, 3 pills fourth day, 2 pills the fifth day, and 1 pill sixth day. 21 tablet Myra Rude, MD     PDMP not reviewed this encounter.   Myra Rude, MD 01/29/19 2041

## 2019-01-29 NOTE — Discharge Instructions (Signed)
Please follow up to have your cyst drained and to get support for your foot  Please try ice on the ankle  Please try the medicine  Please follow up if your symptoms fail to improve or worsen.

## 2019-01-29 NOTE — ED Triage Notes (Signed)
Pt presents with left ankle pain and left wrist pain from unknown source X 7 days.

## 2019-01-31 ENCOUNTER — Other Ambulatory Visit: Payer: Self-pay

## 2019-01-31 ENCOUNTER — Ambulatory Visit (INDEPENDENT_AMBULATORY_CARE_PROVIDER_SITE_OTHER): Payer: PRIVATE HEALTH INSURANCE | Admitting: Sports Medicine

## 2019-01-31 ENCOUNTER — Encounter: Payer: Self-pay | Admitting: Sports Medicine

## 2019-01-31 VITALS — BP 110/64 | Ht 60.5 in | Wt 182.0 lb

## 2019-01-31 DIAGNOSIS — M25572 Pain in left ankle and joints of left foot: Secondary | ICD-10-CM

## 2019-01-31 DIAGNOSIS — M67432 Ganglion, left wrist: Secondary | ICD-10-CM | POA: Diagnosis not present

## 2019-01-31 NOTE — Progress Notes (Addendum)
[facility] [address] [place],  [address] Phone: [phone] Fax: [phone]   Patient Name: April Crane Date of Birth: May 08, 1991 Medical Record Number: [id number] Gender: female Date of Encounter: 01/31/2019  CC: Left ankle pain and wrist swelling  HPI: April Crane is a 28 year old female presenting with 1 week of left ankle pain.  She was seen at the urgent care 2 days ago at which time she was told she had ankle impingement and given p.o. medication.  There was no specific mechanism last week.  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  She predominately wears crocs.  Further history dictates that patient is potentially a chronic ankle sprain individual.  She denies any numbness, tingling, bleeding, skin changes.  He has noticed some swelling on the front of her ankle.  She has also noticed a cyst on her left wrist.  States that has been there for about a month but has grown a little bit more in size and is painful when extending her wrist.  Denies any numbness or tingling.  Occasionally she will have sharp pain.  Does not recall any trauma.  Past Medical History:  Diagnosis Date   No pertinent past medical history    Obstruction of colon ([place])     Current Outpatient Medications on File Prior to Visit  Medication Sig Dispense Refill   predniSONE (DELTASONE) 5 MG tablet Take 6 pills for first day, 5 pills second day, 4 pills third day, 3 pills fourth day, 2 pills the fifth day, and 1 pill sixth day. 21 tablet 0   Pseudoephedrine-APAP-DM (TYLENOL COLD/FLU SEVERE DAY PO) Take 2 tablets by mouth 3 (three) times daily as needed (cold symptoms).     No current facility-administered medications on file prior to visit.     Past Surgical History:  Procedure Laterality Date   ANTERIOR CRUCIATE LIGAMENT REPAIR     ANTERIOR CRUCIATE LIGAMENT REPAIR      Allergies  Allergen Reactions   Ibuprofen  Other (See Comments)    Damaged kidneys   Other Nausea And Vomiting    Antibiotic (patient cannot recall name/elementary school)    Social History   Socioeconomic History   Marital status: Single    Spouse name: Not on file   Number of children: Not on file   Years of education: Not on file   Highest education level: Not on file  Occupational History   Not on file  Social Needs   Financial resource strain: Not on file   Food insecurity    Worry: Not on file    Inability: Not on file   Transportation needs    Medical: Not on file    Non-medical: Not on file  Tobacco Use   Smoking status: Never Smoker   Smokeless tobacco: Never Used  Substance and Sexual Activity   Alcohol use: No   Drug use: No   Sexual activity: Not on file  Lifestyle   Physical activity    Days per week: Not on file    Minutes per session: Not on file   Stress: Not on file  Relationships   Social connections    Talks on phone: Not on file    Gets together: Not on file    Attends religious service: Not on file    Active member of club or organization: Not on file    Attends meetings of clubs or organizations: Not on file  Relationship status: Not on file   Intimate partner violence    Fear of current or ex partner: Not on file    Emotionally abused: Not on file    Physically abused: Not on file    Forced sexual activity: Not on file  Other Topics Concern   Not on file  Social History Narrative   Not on file    Family History  Problem Relation Age of Onset   Asthma Sister    Cancer Maternal Grandmother     BP 110/64    Ht 5' 0.5" (1.537 m)    Wt 182 lb (82.6 kg)    BMI 34.96 kg/m   ROS:  See HPI CONST: no F/C, no malaise, no fatigue MSK: See above NEURO: no numbness/tingling, no weakness SKIN: no rash, no lesions HEME: no bleeding, no bruising, no erythema  Objective: GEN: Alert and oriented, NAD Pulm: Breathing unlabored PSY: normal mood, congruent  affect  MSK Left ankle Mild swelling to anterior ankle TTP over anterior and medial talus Pain with forced dorsiflexion No TTP over lateral malleolus Negative anterior drawer Positive Kleiger's Negative compression test NVI  Right ankle No gross deformity, swelling, ecchymoses FROM TTP Negative ant drawer and talar tilt.   Negative syndesmotic compression. Thompsons test negative. NV intact distally.  Left wrist Dorsal swelling along left wrist consistent with ganglion cyst Normal grip strength Full range of motion at wrist and fingers Sensation intact bilaterally NVI  Procedure: A limited MSK [place] was performed to evaluate ganglion cyst and confirmed fluid-filled sac along dorsum of left wrist. Informed consent was obtained.  Patient understood risks and benefits of procedure.  The dorsum of the wrist was placed and slight flexion.  Alcohol swab was utilized to cleanse skin.  3 cc of 1% lidocaine was injected into the cyst to anesthetize area.  An 18-gauge needle was then used to perforate ganglion cyst and 1 cc of fluid was aspirated.  Ultrasound was then used to visualize resolution of fluid buildup.  Patient tolerated procedure well.  No complications.  Return to clinic precautions were provided.    Assessment and Plan:  1.  Left ankle impingement Commended patient purchase sneakers to wear at work and when ambulating outside of work.  She was given HEP for ankle strengthening.  She will follow-up in 4 weeks.  2.  Left ganglion cyst Successful aspiration as above.  Recommended patient ice the area for the next 2 days.  She can continue to use wrist without restriction.  Can use anti-inflammatory as needed.  She will follow-up in 1 month   April Gaskins, DO, ATC Sports Medicine Fellow  Addendum:  Patient seen in the office by fellow.  His history, exam, plan of care were precepted with me. Present for and supervised ganglion cyst aspiration. Norton Blizzard MD Marrianne Mood

## 2019-02-28 ENCOUNTER — Ambulatory Visit: Payer: PRIVATE HEALTH INSURANCE | Admitting: Sports Medicine

## 2019-03-01 ENCOUNTER — Other Ambulatory Visit: Payer: Self-pay

## 2019-03-01 ENCOUNTER — Ambulatory Visit (INDEPENDENT_AMBULATORY_CARE_PROVIDER_SITE_OTHER): Payer: PRIVATE HEALTH INSURANCE | Admitting: Sports Medicine

## 2019-03-01 ENCOUNTER — Ambulatory Visit
Admission: RE | Admit: 2019-03-01 | Discharge: 2019-03-01 | Disposition: A | Discharge status: Home / Self Care | Payer: PRIVATE HEALTH INSURANCE | Source: Ambulatory Visit | Attending: Sports Medicine | Admitting: Sports Medicine

## 2019-03-01 VITALS — BP 110/74 | Ht 60.5 in | Wt 182.0 lb

## 2019-03-01 DIAGNOSIS — M67432 Ganglion, left wrist: Secondary | ICD-10-CM | POA: Insufficient documentation

## 2019-03-01 DIAGNOSIS — M25572 Pain in left ankle and joints of left foot: Secondary | ICD-10-CM

## 2019-03-01 NOTE — Assessment & Plan Note (Signed)
Chronic pain, no particular history of injury.  Doubt high ankle sprain if patient does not have a history of trauma.  She does have collapse of medial arch bilaterally, which could be contributing to ankle pain.  Given that she is now wearing shoes, will fit her for green insoles with scaphoid pads bilaterally.  Will also obtain x-rays of left ankle given that this problem has not resolved to assess for possible fracture or abnormality of syndesmosis.  Patient will be called after x-ray results are obtained for proper follow-up instructions.

## 2019-03-01 NOTE — Patient Instructions (Signed)
We will refer you to a hand specialist for your left wrist ganglion cyst. We will call around to several places to make sure we find a doctor that takes your insurance. Once we have an appointment we will call you with the appt info. Go get the x-rays of your ankle once you leave our office today.  We will call you with the results of your x-ray once we get them. Call us if you have any questions.

## 2019-03-01 NOTE — Progress Notes (Addendum)
April Crane is a 28 y.o. female who presents to [facility] today for the following:  Left Wrist Ganglion  Was drained by Dr. 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, her medial left palm hurts No history of known trauma  Left Ankle Pain Was also seen for this on 9/23 No particular injury she can think of Pain was worst it has ever been yesterday after work Was wearing tennis shoes at work Never went and bought new shoes as directed at last visit At first was doing ankle strengthening every day, then decreased to every other day, states that she hasn't done any in the last week Pain starts in the anterior ankle, comes around to back, and has pani medially when she is bearing weight Felt better at first when she was doing exercises, resting also improves the pain Walking makes it worse, can sit and move ankle around without pain Prior to last visit, she was not wearing shoes at work, now she is  Has not been taking any oral pain medications for either complaint, no ice for either  PMH reviewed.  ROS as above. Medications reviewed.  Exam:  BP 110/74   Ht 5' 0.5" (1.537 m)   Wt 182 lb (82.6 kg)   BMI 34.96 kg/m  Gen: Well NAD  Left wrist: Inspection: Dorsal swelling along radial side of left wrist consistent with ganglion cyst, no obvious abnormalities of right wrist Palpation: Tenderness to palpation of dorsal swelling of left wrist ROM: Full range of motion at wrist and fingers bilaterally, pain with flexion and extension of left wrist Strength: Normal grip strength, 5/5 strength of flexion, extension, radial deviation, ulnar deviation of bilateral wrists Neurovascular: Intact distally  Left Ankle: - Inspection: No obvious deformity,  erythema, swelling, or ecchymosis, ulcers, calluses, blisters bilaterally. - Palpation: No TTP at MT heads, no TTP at base of 5th MT, no TTP over cuboid, no tenderness over navicular prominence, no TTP over lateral or medial malleolus.  TTP of anterior talus, ATFL, and PTFL.  No tenderness to palpation of right ankle. - Strength: Normal strength with dorsiflexion, plantarflexion, inversion, and eversion of foot; flexion and extension of toes bilaterally.  Pain with resisted dorsiflexion, plantarflexion, and eversion on left. - ROM: Full ROM bilaterally - Neuro/vasc: NV intact bilaterally - Special Tests: Negative anterior drawer, negative talar tilt.  Pain with compression test, patient does note pain at knee and has history of ACL surgery.  Positive Klieger's.  Feet: Mild collapse of medial arch bilaterally.   No results found.   Assessment and Plan: 1) Ganglion cyst of dorsum of left wrist Recurrence of ganglion cyst.  Patient notes that she has been having this pain for multiple years.  Discussed possible treatment options including repeat drainage, watchful waiting, surgery.  Patient opts for referral to hand surgery for possible removal.  Will refer to hand surgeon.  Pain of joint of left ankle and foot Chronic pain, no particular history of injury.  Doubt high ankle sprain if patient does not have a history of trauma.  She does have collapse of medial arch bilaterally, which could be contributing to ankle pain.  Given that she is now wearing shoes, will fit her for green insoles with scaphoid  pads bilaterally.  Will also obtain x-rays of left ankle given that this problem has not resolved to assess for possible fracture or abnormality of syndesmosis.  Patient will be called after x-ray results are obtained for proper follow-up instructions.   Luis Abed, D.O.  PGY-2 Family Medicine  03/01/2019 10:53 AM   Patient seen and evaluated with the resident.  I agree with the above  plan of care.  Referral to hand surgery to discuss ganglion cyst excision.  Green sports insoles and scaphoid pads for her shoes.  I will call her with her x-ray findings when available.  Addendum: Ankle x-rays are unremarkable.  Recommend PT if patient is amendable.

## 2019-03-01 NOTE — Assessment & Plan Note (Addendum)
Recurrence of ganglion cyst.  Patient notes that she has been having this pain for multiple years.  Discussed possible treatment options including repeat drainage, watchful waiting, surgery.  Patient opts for referral to hand surgery for possible removal.  Will refer to hand surgeon.

## 2019-03-08 ENCOUNTER — Telehealth: Payer: Self-pay

## 2019-03-08 NOTE — Telephone Encounter (Signed)
See phone note

## 2019-04-01 ENCOUNTER — Emergency Department (HOSPITAL_COMMUNITY)
Admission: EM | Admit: 2019-04-01 | Discharge: 2019-04-01 | Disposition: A | Discharge status: Home / Self Care | Payer: PRIVATE HEALTH INSURANCE | Attending: Emergency Medicine | Admitting: Emergency Medicine

## 2019-04-01 ENCOUNTER — Other Ambulatory Visit: Payer: Self-pay

## 2019-04-01 ENCOUNTER — Encounter (HOSPITAL_COMMUNITY): Payer: Self-pay | Admitting: Emergency Medicine

## 2019-04-01 DIAGNOSIS — Z20828 Contact with and (suspected) exposure to other viral communicable diseases: Secondary | ICD-10-CM | POA: Insufficient documentation

## 2019-04-01 DIAGNOSIS — R05 Cough: Secondary | ICD-10-CM | POA: Insufficient documentation

## 2019-04-01 DIAGNOSIS — R059 Cough, unspecified: Secondary | ICD-10-CM

## 2019-04-01 DIAGNOSIS — H1033 Unspecified acute conjunctivitis, bilateral: Secondary | ICD-10-CM | POA: Insufficient documentation

## 2019-04-01 LAB — SARS CORONAVIRUS 2 (TAT 6-24 HRS): SARS Coronavirus 2: NEGATIVE

## 2019-04-01 MED ORDER — AEROCHAMBER PLUS FLO-VU LARGE MISC
Status: AC
  Administered 2019-04-01: 11:00:00
  Filled 2019-04-01: qty 1

## 2019-04-01 MED ORDER — BENZONATATE 100 MG PO CAPS: 100.0000 mg | ORAL_CAPSULE | Freq: Three times a day (TID) | ORAL | 0 refills | Status: DC

## 2019-04-01 MED ORDER — ALBUTEROL SULFATE HFA 108 (90 BASE) MCG/ACT IN AERS
4.0000 | INHALATION_SPRAY | Freq: Once | RESPIRATORY_TRACT | Status: AC
  Administered 2019-04-01: 4 via RESPIRATORY_TRACT
  Filled 2019-04-01: qty 6.7

## 2019-04-01 MED ORDER — POLYMYXIN B-TRIMETHOPRIM 10000-0.1 UNIT/ML-% OP SOLN: 1.0000 [drp] | OPHTHALMIC | 0 refills | Status: DC

## 2019-04-01 NOTE — ED Triage Notes (Signed)
C/o cough since Thursday that was non-productive but productive with clear sputum today.  Also reports bilateral eye pain since yesterday.  No known COVID exposures.  Denies fever.

## 2019-04-01 NOTE — Discharge Instructions (Signed)
I am sending you home with cough medication and eye drops. Use as directed. I have also given you a number for a primary care doctor. If your symptoms do not improve within the next week, call to schedule an appointment. Your COVID results are still pending. Self-quarantine until you get your results. Return to the ED for new or worsening symptoms.

## 2019-04-01 NOTE — ED Provider Notes (Signed)
[facility] Provider Note   CSN: [id number] Arrival date & time: 04/01/19  7106     History   Chief Complaint Chief Complaint  Patient presents with  . Cough    HPI April Crane is a 28 y.o. female with no significant past medical history who presents to the ED for an evaluation of gradual onset of worsening dry cough.  Patient notes she Crane had a cough since Thursday which Crane been dry in nature however started coughing up clear sputum this morning.  Cough is associated with bilateral eye redness and pruritus.  Patient notes her right eye Crane been glued shut each morning.  Patient also notes in the morning her eyes feel swollen.  Denies pain with EOMs.  Patient notes her cough is typically worse at night.  Denies history of asthma.  Patient Crane tried Robitussin at home with no relief.  Denies sick contacts and Covid exposures.  Patient is an Brewing technologist.  Patient admits to generalized abdominal pain last night which Crane completely resolved.  Patient notes she is short of breath during her coughing spells.  Patient denies chest pain, nausea, vomiting, and diarrhea.  Past Medical History:  Diagnosis Date  . No pertinent past medical history   . Obstruction of colon [facility])     Patient Active Problem List   Diagnosis Date Noted  . Ganglion cyst of dorsum of left wrist 03/01/2019  . Pain of joint of left ankle and foot 03/01/2019  . Chest pain   . Abdominal pain 07/14/2015  . Generalized weakness 07/14/2015  . AKI (acute kidney injury) ([place]) 07/13/2015  . Acute renal failure (ARF) ([place]) 07/13/2015    Past Surgical History:  Procedure Laterality Date  . ANTERIOR CRUCIATE LIGAMENT REPAIR    . ANTERIOR CRUCIATE LIGAMENT REPAIR       OB History    Gravida  0   Para  0   Term      Preterm      AB      Living        SAB      TAB      Ectopic      Multiple      Live Births               Home Medications     Prior to Admission medications   Medication Sig Start Date End Date Taking? Authorizing Provider  benzonatate (TESSALON) 100 MG capsule Take 1 capsule (100 mg total) by mouth every 8 (eight) hours. 04/01/19   Cheek, Comer Locket, PA-C  predniSONE (DELTASONE) 5 MG tablet Take 6 pills for first day, 5 pills second day, 4 pills third day, 3 pills fourth day, 2 pills the fifth day, and 1 pill sixth day. Patient not taking: Reported on 03/01/2019 01/29/19   Rosemarie Ax, MD  Pseudoephedrine-APAP-DM (TYLENOL COLD/FLU SEVERE DAY PO) Take 2 tablets by mouth 3 (three) times daily as needed (cold symptoms).    [provider]  trimethoprim-polymyxin b (POLYTRIM) ophthalmic solution Place 1 drop into both eyes every 4 (four) hours. 04/01/19   Jonette Eva, PA-C    Family History Family History  Problem Relation Age of Onset  . Asthma Sister   . Cancer Maternal Grandmother     Social History Social History   Tobacco Use  . Smoking status: Never Smoker  . Smokeless tobacco: Never Used  Substance Use Topics  . Alcohol use: No  .  Drug use: No     Allergies   Ibuprofen and Other   Review of Systems Review of Systems  Constitutional: Negative for chills and fever.  HENT: Positive for congestion and rhinorrhea. Negative for sore throat and trouble swallowing.   Eyes: Positive for discharge, redness and itching.  Respiratory: Positive for cough and shortness of breath (only when coughing).   Cardiovascular: Negative for chest pain and palpitations.  Gastrointestinal: Positive for abdominal pain (resolved). Negative for diarrhea, nausea and vomiting.  Genitourinary: Negative for dysuria.  Musculoskeletal: Negative for myalgias.  Skin: Negative for color change and rash.  Neurological: Negative for dizziness and light-headedness.     Physical Exam Updated Vital Signs BP 114/72 (BP Location: Right Arm)   Pulse 91   Temp 98.5 F (36.9 C) (Oral)   Resp 16   SpO2 99%    Physical Exam Vitals signs and nursing note reviewed.  Constitutional:      General: She is not in acute distress.    Appearance: She is not toxic-appearing.  HENT:     Head: Normocephalic.     Mouth/Throat:     Comments: Posterior oropharynx clear and mucous membranes moist, there is mild erythema but no edema or tonsillar exudates, uvula midline, normal phonation, no trismus, tolerating secretions without difficulty.  Eyes:     Extraocular Movements: Extraocular movements intact.     Pupils: Pupils are equal, round, and reactive to light.     Comments: Injected conjunctiva bilaterally. No ciliary flush. No periorbital edema or tenderness to palpation. Mild clear drainage from right eye.  Neck:     Musculoskeletal: Normal range of motion and neck supple. No neck rigidity or muscular tenderness.  Cardiovascular:     Rate and Rhythm: Normal rate and regular rhythm.     Pulses: Normal pulses.     Heart sounds: Normal heart sounds. No murmur. No friction rub. No gallop.   Pulmonary:     Effort: Pulmonary effort is normal.     Breath sounds: Normal breath sounds.     Comments: Respirations equal and unlabored, patient able to speak in full sentences, lungs clear to auscultation bilaterally Abdominal:     General: Abdomen is flat. Bowel sounds are normal. There is no distension.     Palpations: Abdomen is soft.  Musculoskeletal:     Right lower leg: No edema.     Left lower leg: No edema.     Comments: Able to move all 4 extremities without difficulty. No lower extremity edema. Pulses and sensation intact bilaterally.  Neurological:     General: No focal deficit present.     Mental Status: She is alert.      ED Treatments / Results  Labs (all labs ordered are listed, but only abnormal results are displayed) Labs Reviewed  SARS CORONAVIRUS 2 (TAT 6-24 HRS)    EKG None  Radiology No results found.  Procedures Procedures (including critical care time)  Medications  Ordered in ED Medications  albuterol (VENTOLIN HFA) 108 (90 Base) MCG/ACT inhaler 4 puff (4 puffs Inhalation Given 04/01/19 1100)  AeroChamber Plus Flo-Vu Large MISC (  Given 04/01/19 1101)     Initial Impression / Assessment and Plan / ED Course  I have reviewed the triage vital signs and the nursing notes.  Pertinent labs & imaging results that were available during my care of the patient were reviewed by me and considered in my medical decision making (see chart for details).  28 year old female presents for an evaluation of cough. Patient is afebrile, not tachycardic or hypoxic.  Patient is in no acute distress and nontoxic-appearing.  Lungs clear to auscultation bilaterally. Injected conjunctiva bilaterally likely due to conjunctivitis. Throat with mild erythema.  Uvula midline. No concern for peritonsillar or retropharyngeal abscess. Abdomen soft, nondistended, nontender.  No lower extremity edema. Suspect symptoms are related to Covid infection vs. another viral illness.  Will send for outpatient Covid test.   Patient notes her symptoms improved after albuterol treatment. Will treat symptomatically with Tessalon and polymyxin eye drop for bacterial conjunctivitis. Discussed with patient that her eye symptoms are most likely viral or allergic in etiology given her other symptoms; however, patient states she would like eye drops at this time. PCP number given to patient. She Crane been advised to follow-up with PCP if symptoms do not improve within the next week. COVID test pending. Patient Crane been instructed to self-quarantine until results become available. Strict ED precautions discussed with patient. Patient states understanding and agrees to plan. Patient discharged home in no acute distress and vitals within normal limits.      Craig StaggersKeisha L Arseneau was evaluated in Emergency Department on 04/01/2019 for the symptoms described in the history of present illness. She was evaluated in  the context of the global COVID-19 pandemic, which necessitated consideration that the patient might be at risk for infection with the SARS-CoV-2 virus that causes COVID-19. Institutional protocols and algorithms that pertain to the evaluation of patients at risk for COVID-19 are in a state of rapid change based on information released by regulatory bodies including the CDC and federal and state organizations. These policies and algorithms were followed during the patient's care in the ED.  Final Clinical Impressions(s) / ED Diagnoses   Final diagnoses:  Cough  Acute bacterial conjunctivitis of both eyes    ED Discharge Orders         Ordered    benzonatate (TESSALON) 100 MG capsule  Every 8 hours     04/01/19 1115    trimethoprim-polymyxin b (POLYTRIM) ophthalmic solution  Every 4 hours     04/01/19 1115           Lorelle FormosaCheek,  B, PA-C 04/01/19 1938    Tegeler, Canary Brimhristopher J, MD 04/02/19 [phone]

## 2019-05-09 IMAGING — CR CHEST - 2 VIEW
2 series · 2 of 2 positions shown · non-contrast
Comparison: July 14, 2015

CLINICAL DATA: Cough for 3 days

EXAM:
CHEST - 2 VIEW

[chest pa]
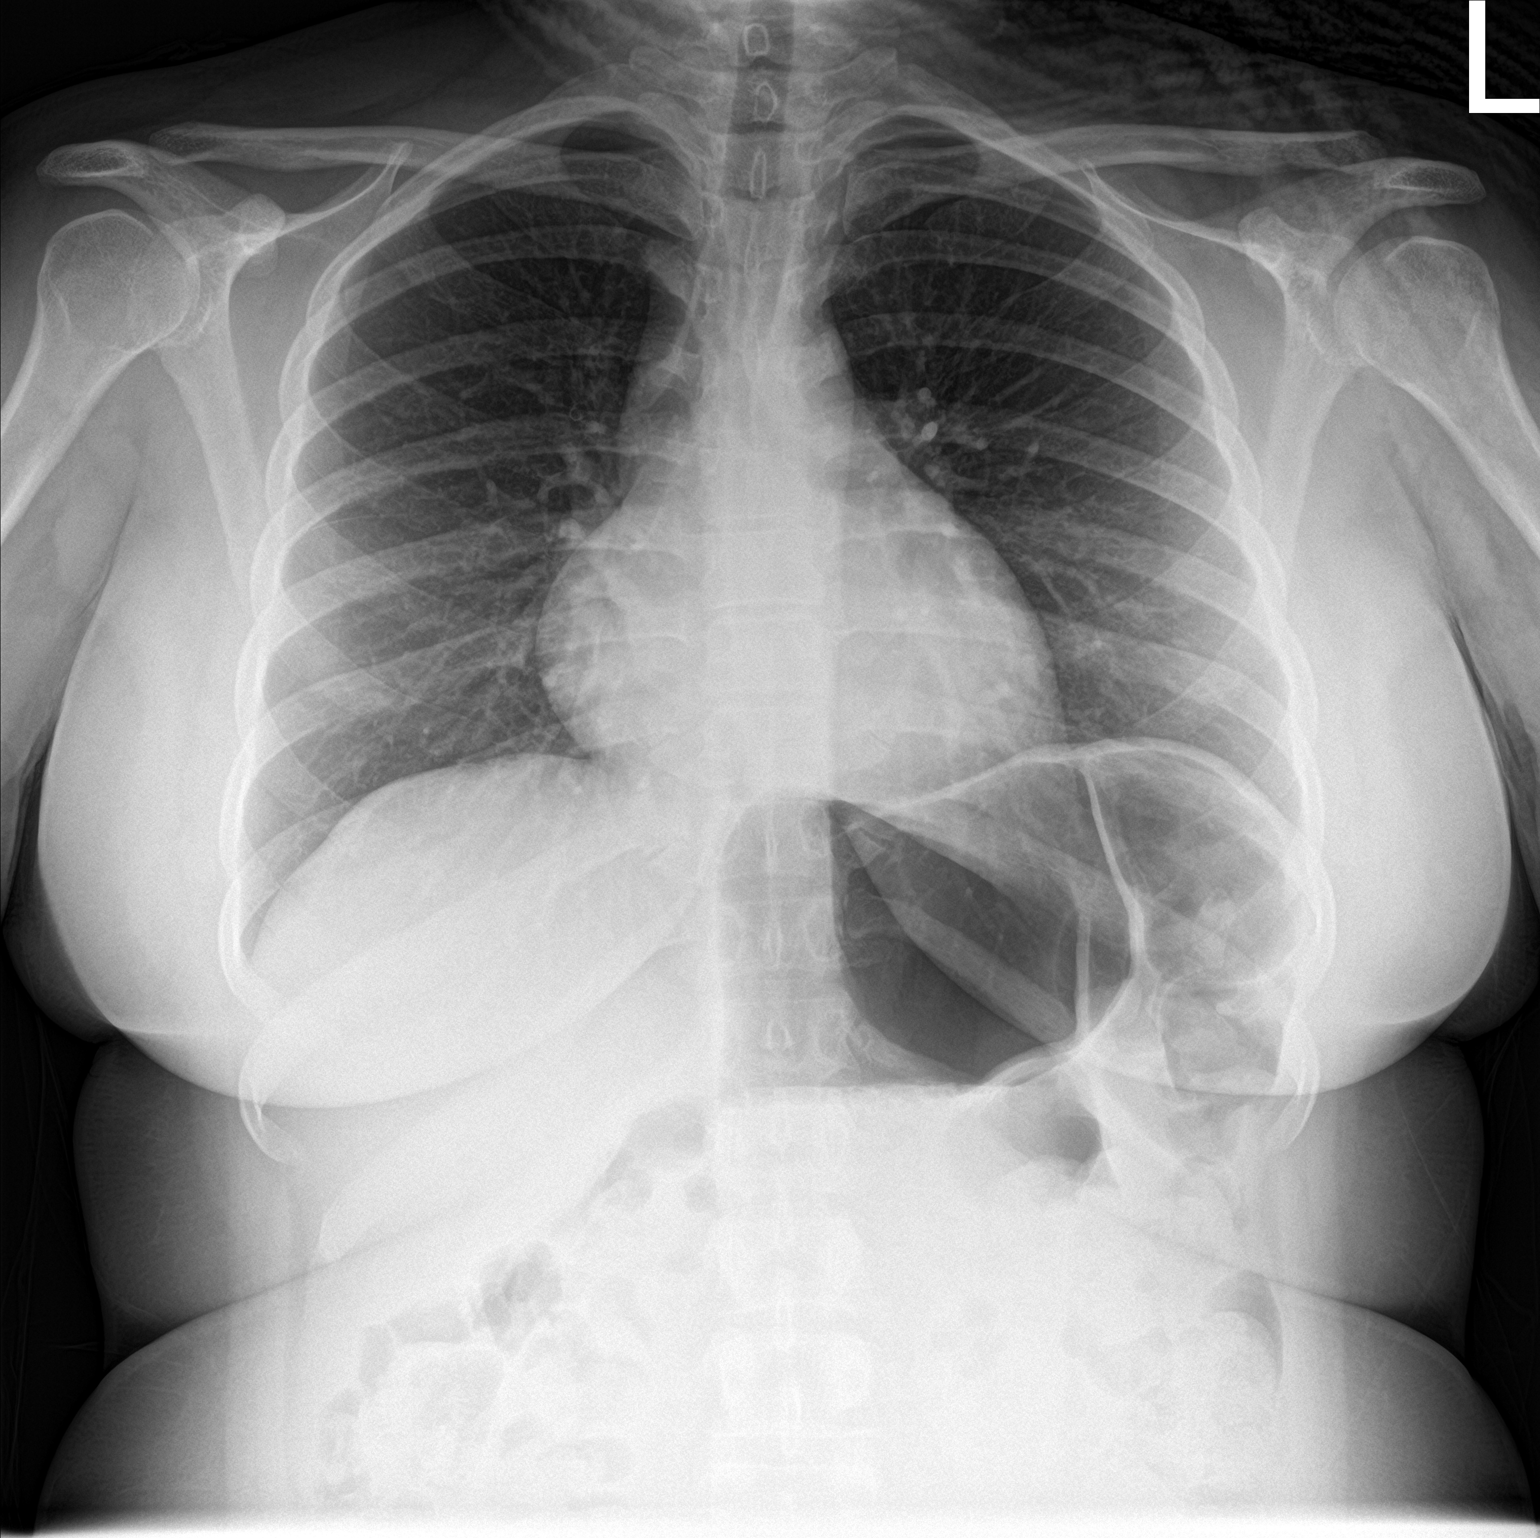

[chest lat]
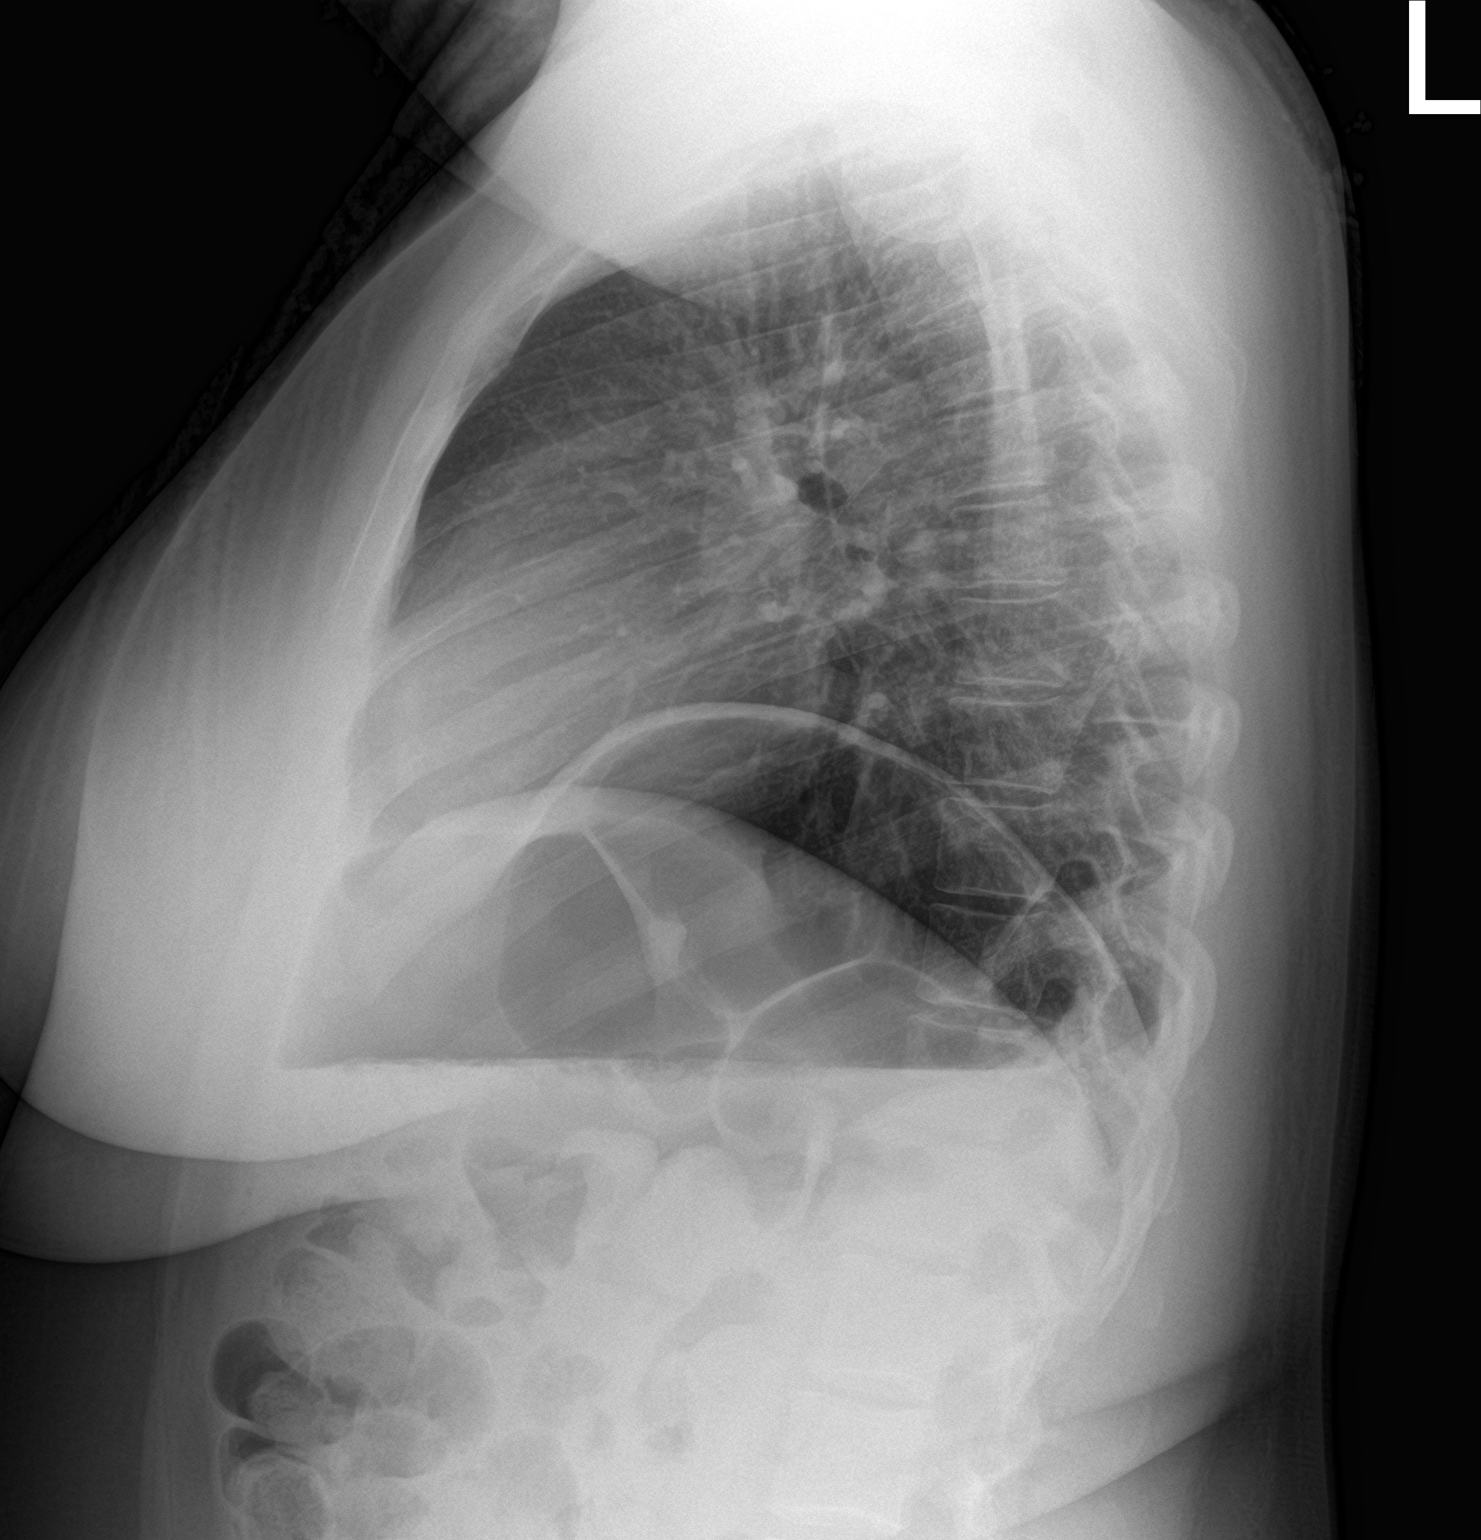

[2 of 2 positions shown; findings below may reference images not displayed]

FINDINGS: No edema or consolidation. Heart size and pulmonary vascularity are
normal. No adenopathy. No bone lesions.
IMPRESSION: No edema or consolidation.

## 2019-05-23 ENCOUNTER — Ambulatory Visit: Payer: PRIVATE HEALTH INSURANCE | Attending: Internal Medicine

## 2019-05-23 DIAGNOSIS — Z20822 Contact with and (suspected) exposure to covid-19: Secondary | ICD-10-CM

## 2019-05-24 LAB — NOVEL CORONAVIRUS, NAA: SARS-CoV-2, NAA: NOT DETECTED

## 2019-08-23 ENCOUNTER — Emergency Department (HOSPITAL_COMMUNITY)
Admission: EM | Admit: 2019-08-23 | Discharge: 2019-08-23 | Disposition: A | Discharge status: Home / Self Care | Payer: PRIVATE HEALTH INSURANCE | Attending: Emergency Medicine | Admitting: Emergency Medicine

## 2019-08-23 ENCOUNTER — Encounter (HOSPITAL_COMMUNITY): Payer: Self-pay

## 2019-08-23 ENCOUNTER — Other Ambulatory Visit: Payer: Self-pay

## 2019-08-23 DIAGNOSIS — R42 Dizziness and giddiness: Secondary | ICD-10-CM | POA: Insufficient documentation

## 2019-08-23 DIAGNOSIS — T783XXA Angioneurotic edema, initial encounter: Secondary | ICD-10-CM

## 2019-08-23 DIAGNOSIS — R220 Localized swelling, mass and lump, head: Secondary | ICD-10-CM | POA: Insufficient documentation

## 2019-08-23 DIAGNOSIS — L299 Pruritus, unspecified: Secondary | ICD-10-CM | POA: Insufficient documentation

## 2019-08-23 MED ORDER — FAMOTIDINE 20 MG PO TABS: 20.0000 mg | ORAL_TABLET | Freq: Once | ORAL | Status: DC

## 2019-08-23 MED ORDER — DIPHENHYDRAMINE HCL 25 MG PO TABS: 25.0000 mg | ORAL_TABLET | Freq: Four times a day (QID) | ORAL | 0 refills | Status: DC

## 2019-08-23 MED ORDER — PREDNISONE 20 MG PO TABS: ORAL_TABLET | ORAL | 0 refills | Status: DC

## 2019-08-23 MED ORDER — DEXAMETHASONE SODIUM PHOSPHATE 10 MG/ML IJ SOLN
10.0000 mg | Freq: Once | INTRAMUSCULAR | Status: AC
  Administered 2019-08-23: 10 mg via INTRAVENOUS
  Filled 2019-08-23: qty 1

## 2019-08-23 MED ORDER — FAMOTIDINE IN NACL 20-0.9 MG/50ML-% IV SOLN
20.0000 mg | Freq: Once | INTRAVENOUS | Status: AC
  Administered 2019-08-23: 20 mg via INTRAVENOUS
  Filled 2019-08-23: qty 50

## 2019-08-23 MED ORDER — DIPHENHYDRAMINE HCL 50 MG/ML IJ SOLN
25.0000 mg | Freq: Once | INTRAMUSCULAR | Status: AC
  Administered 2019-08-23: 25 mg via INTRAVENOUS
  Filled 2019-08-23: qty 1

## 2019-08-23 NOTE — ED Notes (Signed)
Patient ambulatory to restroom at this time. 

## 2019-08-23 NOTE — ED Notes (Signed)
ED Provider at bedside. 

## 2019-08-23 NOTE — Discharge Instructions (Signed)
Please stop taking diflucan, multi-vitamin and metronidazole as one of these medications may have caused your symptoms.  Return if you have any concerns.

## 2019-08-23 NOTE — ED Provider Notes (Signed)
[facility]-EMERGENCY DEPT Provider Note   CSN: [id number] Arrival date & time: 08/23/19  1601     History Chief Complaint  Patient presents with  . Allergic Reaction    April Crane is a 29 y.o. female.  The history is provided by the patient. No language interpreter was used.  Allergic Reaction    29 year old female presenting for evaluation of suspected drug allergy.  Patient reports she woke up this morning feeling swelling around her lips, up chest and back.  She described tingling and itchy sensation around her lips, when she swallows she report some mild discomfort.  She also endorsed some dizziness sensation.  She denies shortness of breath or wheezing no abdominal cramping, no urticaria.  She report been given medication for her vaginal discharge when she had a PCP's visit yesterday.  States she was prescribed Diflucan, metronidazole, as well as a multivitamin, last taken yesterday afternoon and none this morning.  She did have history of allergies to ibuprofen.  She did took some Tylenol this morning.  She is not on any blood pressure medication.  She denies any other environmental changes.  Past Medical History:  Diagnosis Date  . No pertinent past medical history   . Obstruction of colon [facility])     Patient Active Problem List   Diagnosis Date Noted  . Ganglion cyst of dorsum of left wrist 03/01/2019  . Pain of joint of left ankle and foot 03/01/2019  . Chest pain   . Abdominal pain 07/14/2015  . Generalized weakness 07/14/2015  . AKI (acute kidney injury) ([facility]) 07/13/2015  . Acute renal failure (ARF) ([facility]) 07/13/2015    Past Surgical History:  Procedure Laterality Date  . ANTERIOR CRUCIATE LIGAMENT REPAIR    . ANTERIOR CRUCIATE LIGAMENT REPAIR       OB History    Gravida  0   Para  0   Term      Preterm      AB      Living        SAB      TAB      Ectopic      Multiple      Live Births              Family  History  Problem Relation Age of Onset  . Asthma Sister   . Cancer Maternal Grandmother     Social History   Tobacco Use  . Smoking status: Never Smoker  . Smokeless tobacco: Never Used  Substance Use Topics  . Alcohol use: No  . Drug use: No    Home Medications Prior to Admission medications   Medication Sig Start Date End Date Taking? Authorizing Provider  benzonatate (TESSALON) 100 MG capsule Take 1 capsule (100 mg total) by mouth every 8 (eight) hours. 04/01/19   Mannie Stabile, PA-C  predniSONE (DELTASONE) 5 MG tablet Take 6 pills for first day, 5 pills second day, 4 pills third day, 3 pills fourth day, 2 pills the fifth day, and 1 pill sixth day. Patient not taking: Reported on 03/01/2019 01/29/19   Myra Rude, MD  Pseudoephedrine-APAP-DM (TYLENOL COLD/FLU SEVERE DAY PO) Take 2 tablets by mouth 3 (three) times daily as needed (cold symptoms).    [provider]  trimethoprim-polymyxin b (POLYTRIM) ophthalmic solution Place 1 drop into both eyes every 4 (four) hours. 04/01/19   Mannie Stabile, PA-C    Allergies    Ibuprofen and Other  Review of Systems   Review of Systems  All other systems reviewed and are negative.   Physical Exam Updated Vital Signs BP (!) 142/98   Pulse 94   Temp 98.2 F (36.8 C) (Oral)   Resp 16   Ht 5' 0.5" (1.537 m)   Wt 90.3 kg   SpO2 95%   BMI 38.22 kg/m   Physical Exam Vitals and nursing note reviewed.  Constitutional:      General: She is not in acute distress.    Appearance: She is well-developed.  HENT:     Head: Atraumatic.     Mouth/Throat:     Comments: Mouth: Faint angioedema involving upper and lower lips.  No tongue swelling.  No stridor. Eyes:     Conjunctiva/sclera: Conjunctivae normal.  Cardiovascular:     Rate and Rhythm: Normal rate and regular rhythm.     Pulses: Normal pulses.     Heart sounds: Normal heart sounds.  Pulmonary:     Effort: Pulmonary effort is normal.     Breath  sounds: Normal breath sounds. No wheezing.  Chest:     Chest wall: No tenderness.  Abdominal:     Palpations: Abdomen is soft.     Tenderness: There is no abdominal tenderness.  Musculoskeletal:     Cervical back: Neck supple.  Skin:    Findings: No rash.  Neurological:     Mental Status: She is alert and oriented to person, place, and time.  Psychiatric:        Mood and Affect: Mood normal.     ED Results / Procedures / Treatments   Labs (all labs ordered are listed, but only abnormal results are displayed) Labs Reviewed - No data to display  EKG None  Radiology No results found.  Procedures Procedures (including critical care time)  Medications Ordered in ED Medications  dexamethasone (DECADRON) injection 10 mg (10 mg Intravenous Given 08/23/19 1013)  diphenhydrAMINE (BENADRYL) injection 25 mg (25 mg Intravenous Given 08/23/19 1013)  famotidine (PEPCID) IVPB 20 mg premix (0 mg Intravenous Stopped 08/23/19 1209)    ED Course  I have reviewed the triage vital signs and the nursing notes.  Pertinent labs & imaging results that were available during my care of the patient were reviewed by me and considered in my medical decision making (see chart for details).    MDM Rules/Calculators/A&P                      BP 109/67   Pulse 81   Temp 98.2 F (36.8 C) (Oral)   Resp 16   Ht 5' 0.5" (1.537 m)   Wt 90.3 kg   SpO2 98%   BMI 38.22 kg/m   Final Clinical Impression(s) / ED Diagnoses Final diagnoses:  Angioedema of lips, initial encounter    Rx / DC Orders ED Discharge Orders         Ordered    diphenhydrAMINE (BENADRYL) 25 MG tablet  Every 6 hours     08/23/19 1502    predniSONE (DELTASONE) 20 MG tablet     08/23/19 1502         10:01 AM Patient here with angioedema of the lips that started 3 hours ago.  She also endorsed some mild dizziness as well as trouble swallowing.  She has normal phonation, no tongue edema, no wheezing on exam.  Will monitor  closely, Will give decadron/benadryl/pepcid. Will hold off EpiPen at the moment as pt is resting  comfortably and airway is intact, normal phonation, no wheezing.    11:45 AM Patient has been observed for the past 2 hours.  No worsening of symptoms.  She is resting comfortably.  No significant improvement of her angioedema of the lips.  Will continue to monitor.   2:59 PM Pt report mild improvement of her sxs.  At this time pt stable for discharge.  Recommend stopping multi-vitamin, diflucan and metronidazole.  Strict return precaution given.  outpt f/u recommended.    Fayrene Helper, PA-C 08/23/19 1503    Curat[place], Adam, DO 08/23/19 [phone]

## 2019-08-23 NOTE — ED Triage Notes (Addendum)
Patient reports starting new medication for UTI yesterday.  (Diflucan, metronidazole, and multivitamin.   Patient woke up this morning with facial swelling left/right jaw area and lips. Reports it hurts "a little" to swallow.  Patient also reporting back pain.   Reports allergies to ibuprofen.   A/Ox4 Ambulatory in triage Pt able to speak in complete sentence with no issues notes.

## 2019-09-21 ENCOUNTER — Emergency Department (HOSPITAL_COMMUNITY)
Admission: EM | Admit: 2019-09-21 | Discharge: 2019-09-21 | Disposition: A | Discharge status: Home / Self Care | Payer: Medicaid Other | Attending: Emergency Medicine | Admitting: Emergency Medicine

## 2019-09-21 ENCOUNTER — Encounter (HOSPITAL_COMMUNITY): Payer: Self-pay

## 2019-09-21 ENCOUNTER — Other Ambulatory Visit: Payer: Self-pay

## 2019-09-21 DIAGNOSIS — H5712 Ocular pain, left eye: Secondary | ICD-10-CM | POA: Insufficient documentation

## 2019-09-21 DIAGNOSIS — H11432 Conjunctival hyperemia, left eye: Secondary | ICD-10-CM | POA: Insufficient documentation

## 2019-09-21 DIAGNOSIS — H5789 Other specified disorders of eye and adnexa: Secondary | ICD-10-CM

## 2019-09-21 DIAGNOSIS — Z79899 Other long term (current) drug therapy: Secondary | ICD-10-CM | POA: Insufficient documentation

## 2019-09-21 DIAGNOSIS — H538 Other visual disturbances: Secondary | ICD-10-CM | POA: Insufficient documentation

## 2019-09-21 HISTORY — DX: Disorder of kidney and ureter, unspecified: N28.9

## 2019-09-21 MED ORDER — ERYTHROMYCIN 5 MG/GM OP OINT
TOPICAL_OINTMENT | OPHTHALMIC | 0 refills | Status: DC
Start: 2019-09-21 — End: 2021-01-28

## 2019-09-21 MED ORDER — TETRACAINE HCL 0.5 % OP SOLN
2.0000 [drp] | Freq: Once | OPHTHALMIC | Status: AC
  Administered 2019-09-21: 2 [drp] via OPHTHALMIC
  Filled 2019-09-21: qty 4

## 2019-09-21 MED ORDER — FLUORESCEIN SODIUM 1 MG OP STRP
1.0000 | ORAL_STRIP | Freq: Once | OPHTHALMIC | Status: AC
  Administered 2019-09-21: 1 via OPHTHALMIC
  Filled 2019-09-21: qty 1

## 2019-09-21 NOTE — Discharge Instructions (Addendum)
I have given you contact information with an ophthalmologist, Dr. Dione Booze.  His phone number is [phone] and he states you can text him if your symptoms change.  He is going to see you at 8 AM tomorrow morning.  Please make sure to make this appointment.

## 2019-09-21 NOTE — ED Triage Notes (Signed)
Patient states she woke this AM with left eye pain and slight drainage. Patient states the light hurts her eyes Patient also c/o right eye pain,but is ble to open without dificulty

## 2019-09-21 NOTE — ED Provider Notes (Signed)
[facility]-EMERGENCY DEPT Provider Note   CSN: [id number] Arrival date & time: 09/21/19  1553     History Chief Complaint  Patient presents with  . Eye Drainage  . Eye Pain    April Crane is a 29 y.o. female.  HPI HPI Comments: April Crane is a 29 y.o. female who presents to the Emergency Department complaining of left eye pain.  Patient states she woke this morning with left eye redness and exquisite pain.  She reports associated photophobia.  She notes a history of similar symptoms in the past but not of this severity.  She has been evaluated by a "eye doctor" that told her she has "increased pressure behind her eye" but was not concerned at that time.  She states she reached out to her eye doctor earlier today who recommended she come to the emergency department for evaluation.  She wears glasses but does not wear contacts.  She denies any recent known foreign bodies in the affected eye.  No other complaints at this time.      Past Medical History:  Diagnosis Date  . No pertinent past medical history   . Obstruction of colon (HCC)   . Renal disorder     Patient Active Problem List   Diagnosis Date Noted  . Ganglion cyst of dorsum of left wrist 03/01/2019  . Pain of joint of left ankle and foot 03/01/2019  . Chest pain   . Abdominal pain 07/14/2015  . Generalized weakness 07/14/2015  . AKI (acute kidney injury) (HCC) 07/13/2015  . Acute renal failure (ARF) (HCC) 07/13/2015    Past Surgical History:  Procedure Laterality Date  . ANTERIOR CRUCIATE LIGAMENT REPAIR    . ANTERIOR CRUCIATE LIGAMENT REPAIR       OB History    Gravida  0   Para  0   Term      Preterm      AB      Living        SAB      TAB      Ectopic      Multiple      Live Births              Family History  Problem Relation Age of Onset  . Asthma Sister   . Cancer Maternal Grandmother     Social History   Tobacco Use  . Smoking status:  Never Smoker  . Smokeless tobacco: Never Used  Substance Use Topics  . Alcohol use: No  . Drug use: No    Home Medications Prior to Admission medications   Medication Sig Start Date End Date Taking? Authorizing Provider  acetaminophen (TYLENOL) 325 MG tablet Take 650 mg by mouth every 6 (six) hours as needed for mild pain or headache.    [provider]  diphenhydrAMINE (BENADRYL) 25 MG tablet Take 1 tablet (25 mg total) by mouth every 6 (six) hours. 08/23/19   Fayrene Helper, PA-C  fluconazole (DIFLUCAN) 150 MG tablet Take 150 mg by mouth See admin instructions. 150mg  on day 1, 4 and 7 then stop    [provider]  metroNIDAZOLE (FLAGYL) 500 MG tablet Take 500 mg by mouth 2 (two) times daily. 7 day supply    [provider]  Multiple Vitamin (MULTIVITAMIN WITH MINERALS) TABS tablet Take 1 tablet by mouth daily.    [provider]  predniSONE (DELTASONE) 20 MG tablet 2 tabs po daily x 3 days  08/23/19   Fayrene Helper, PA-C    Allergies    Ibuprofen and Other  Review of Systems   Review of Systems  Constitutional: Negative for chills and fever.  HENT: Negative for congestion, rhinorrhea and sore throat.   Eyes: Positive for photophobia, pain, discharge, redness and visual disturbance.  Respiratory: Negative for shortness of breath.   Cardiovascular: Negative for chest pain.  Neurological: Negative for weakness, numbness and headaches.   Physical Exam Updated Vital Signs BP 125/86 (BP Location: Right Arm)   Pulse (!) 101   Temp 98.4 F (36.9 C) (Oral)   Resp 16   Ht 5' 0.5" (1.537 m)   Wt 90.3 kg   BMI 38.22 kg/m   Physical Exam Vitals and nursing note reviewed.  Constitutional:      General: She is not in acute distress.    Appearance: Normal appearance. She is normal weight. She is not ill-appearing, toxic-appearing or diaphoretic.  HENT:     Head: Normocephalic and atraumatic.     Right Ear: External ear normal.     Left Ear: External ear  normal.     Nose: Nose normal.     Mouth/Throat:     Mouth: Mucous membranes are moist.     Pharynx: Oropharynx is clear. No oropharyngeal exudate or posterior oropharyngeal erythema.  Eyes:     Comments: Left eye diffusely injected.  Left pupil mildly constricted compared to the right.  Both pupils are reactive to light.  Visual acuity performed showing 20/25 in the right eye and 20/40 in the left eye.  Photophobia of the left eye with consensual photophobia on the right.  Fluorescein stain performed showing no corneal abrasions in the bilateral eyes.  IOP is 19 in the right eye and 17 in the left eye.  Extraocular movements are intact.  Cardiovascular:     Rate and Rhythm: Normal rate.     Pulses: Normal pulses.  Pulmonary:     Effort: Pulmonary effort is normal.  Abdominal:     General: Abdomen is flat.  Musculoskeletal:        General: Normal range of motion.     Cervical back: Normal range of motion.  Skin:    General: Skin is warm and dry.     Capillary Refill: Capillary refill takes less than 2 seconds.  Neurological:     General: No focal deficit present.     Mental Status: She is alert and oriented to person, place, and time.  Psychiatric:        Mood and Affect: Mood normal.        Behavior: Behavior normal.    ED Results / Procedures / Treatments   Labs (all labs ordered are listed, but only abnormal results are displayed) Labs Reviewed - No data to display  EKG None  Radiology No results found.  Procedures Procedures (including critical care time)  Medications Ordered in ED Medications  tetracaine (PONTOCAINE) 0.5 % ophthalmic solution 2 drop (2 drops Left Eye Given by Other 09/21/19 1750)  fluorescein ophthalmic strip 1 strip (1 strip Left Eye Given 09/21/19 1750)    ED Course  I have reviewed the triage vital signs and the nursing notes.  Pertinent labs & imaging results that were available during my care of the patient were reviewed by me and  considered in my medical decision making (see chart for details).    MDM Rules/Calculators/A&P  Patient is a pleasant 29 year old female who presents with atraumatic left eye pain. Physical exam as noted above. Differential diagnosis was initially corneal abrasion versus acute angle-closure glaucoma versus uveitis versus conjunctivitis.  Intraocular pressure appears to be normal in both eyes. I did not visualize any corneal abrasions after staining the eyes with fluorescein and examined with Sherral Hammers lamp. Visual acuity is 20/25 in the right eye 20/40 in the left eye. Patient wears glasses at baseline and does not wear contacts.  This patient was discussed with and evaluated by my attending physician Dr. Lennice Sites. 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 Unfortunately the slit-lamp this emergency department is nonoperable at this time.  Patient was given a prescription for erythromycin. I recommended Tylenol and Motrin for breakthrough pain. She understands she is to return to the emergency department if her symptoms worsen. She was given follow-up information for ophthalmology. I recommended she reach out to them as soon as possible to schedule an appointment. I also recommended she reach out to local optometrist in the area as well as her regular optometrist to schedule an appointment for tomorrow. Her questions were answered and she was amicable the time of discharge. Her vital signs are stable.  Patient discharged to home/self care.  Condition at discharge: Stable  Note: Portions of this report may have been transcribed using voice recognition software. Every effort was made to ensure accuracy; however, inadvertent computerized transcription errors may be present.    Final Clinical Impression(s) / ED Diagnoses Final diagnoses:  Left eye pain  Eye redness    Rx / DC Orders ED Discharge Orders         Ordered    erythromycin  ophthalmic ointment     09/21/19 1901           Rayna Sexton, PA-C 09/21/19 1906    Lennice Sites, DO 09/21/19 1928

## 2019-09-21 NOTE — ED Notes (Signed)
Visual acuity with prescription glasses. Right eye 20/25 Left eye 20/40

## 2019-10-18 ENCOUNTER — Emergency Department (HOSPITAL_COMMUNITY): Payer: 59

## 2019-10-18 ENCOUNTER — Other Ambulatory Visit: Payer: Self-pay

## 2019-10-18 ENCOUNTER — Emergency Department (HOSPITAL_COMMUNITY)
Admission: EM | Admit: 2019-10-18 | Discharge: 2019-10-18 | Disposition: A | Discharge status: Home / Self Care | Payer: 59 | Attending: Emergency Medicine | Admitting: Emergency Medicine

## 2019-10-18 ENCOUNTER — Encounter (HOSPITAL_COMMUNITY): Payer: Self-pay | Admitting: Emergency Medicine

## 2019-10-18 DIAGNOSIS — R05 Cough: Secondary | ICD-10-CM | POA: Diagnosis not present

## 2019-10-18 DIAGNOSIS — J029 Acute pharyngitis, unspecified: Secondary | ICD-10-CM | POA: Insufficient documentation

## 2019-10-18 DIAGNOSIS — Z79899 Other long term (current) drug therapy: Secondary | ICD-10-CM | POA: Insufficient documentation

## 2019-10-18 DIAGNOSIS — J392 Other diseases of pharynx: Secondary | ICD-10-CM

## 2019-10-18 DIAGNOSIS — R053 Chronic cough: Secondary | ICD-10-CM

## 2019-10-18 LAB — GROUP A STREP BY PCR: Group A Strep by PCR: NOT DETECTED

## 2019-10-18 NOTE — ED Provider Notes (Signed)
[facility]-EMERGENCY DEPT Provider Note   CSN: [id number] Arrival date & time: 10/18/19  0453     History No chief complaint on file.   April Crane is a 29 y.o. female presents to the ER for evaluation of throat discomfort.  Describes it as "dirt in there", dry sensation since Saturday.  States it will feel better after drinking fluids for a bit but then it comes back.  This morning she woke up twice startled and gasp for air twice states her throat was not actually bothering her at that time.  This throat discomfort is intermittent, states her throat feels okay for now.  Reports having a cough for a couple of months.  She went to an outside hospital and was tested for Covid which was negative and given a prescription.  States the cough is better but it still there, lingering.  She has bouts of coughing throughout the day.  Reports around this time of the year she gets itchy eyes but she thinks it is related to seasonal allergies.  No sick contacts.  No fever or chills.  Has not been vaccinated for COVID-19 but denies any symptoms including rhinorrhea, vomiting, diarrhea, body aches, loss of taste or smell.  No history of acid reflux. HPI     Past Medical History:  Diagnosis Date  . No pertinent past medical history   . Obstruction of colon (HCC)   . Renal disorder     Patient Active Problem List   Diagnosis Date Noted  . Ganglion cyst of dorsum of left wrist 03/01/2019  . Pain of joint of left ankle and foot 03/01/2019  . Chest pain   . Abdominal pain 07/14/2015  . Generalized weakness 07/14/2015  . AKI (acute kidney injury) (HCC) 07/13/2015  . Acute renal failure (ARF) (HCC) 07/13/2015    Past Surgical History:  Procedure Laterality Date  . ANTERIOR CRUCIATE LIGAMENT REPAIR    . ANTERIOR CRUCIATE LIGAMENT REPAIR       OB History    Gravida  0   Para  0   Term      Preterm      AB      Living        SAB      TAB      Ectopic        Multiple      Live Births              Family History  Problem Relation Age of Onset  . Asthma Sister   . Cancer Maternal Grandmother     Social History   Tobacco Use  . Smoking status: Never Smoker  . Smokeless tobacco: Never Used  Vaping Use  . Vaping Use: Never used  Substance Use Topics  . Alcohol use: No  . Drug use: No    Home Medications Prior to Admission medications   Medication Sig Start Date End Date Taking? Authorizing Provider  acetaminophen (TYLENOL) 325 MG tablet Take 650 mg by mouth every 6 (six) hours as needed for mild pain or headache.    [provider]  diphenhydrAMINE (BENADRYL) 25 MG tablet Take 1 tablet (25 mg total) by mouth every 6 (six) hours. 08/23/19   Fayrene Helper, PA-C  erythromycin ophthalmic ointment Place a 1/2 inch ribbon of ointment into the lower eyelid 2-3 times per day. 09/21/19   Placido Sou, PA-C  fluconazole (DIFLUCAN) 150 MG tablet Take 150 mg by mouth See admin instructions.  150mg  on day 1, 4 and 7 then stop    [provider]  metroNIDAZOLE (FLAGYL) 500 MG tablet Take 500 mg by mouth 2 (two) times daily. 7 day supply    [provider]  Multiple Vitamin (MULTIVITAMIN WITH MINERALS) TABS tablet Take 1 tablet by mouth daily.    [provider]  predniSONE (DELTASONE) 20 MG tablet 2 tabs po daily x 3 days 08/23/19   08/25/19, PA-C    Allergies    Flagyl [metronidazole], Tetracyclines & related, Diflucan [fluconazole], Ibuprofen, and Other  Review of Systems   Review of Systems  HENT:       Throat discomfort   Eyes: Positive for itching.  Respiratory: Positive for cough.   All other systems reviewed and are negative.   Physical Exam Updated Vital Signs BP 125/87 (BP Location: Right Arm)   Pulse 94   Temp 98.4 F (36.9 C) (Oral)   Resp 18   Ht 5' 0.5" (1.537 m)   Wt 92.5 kg   LMP 09/27/2019   SpO2 100%   BMI 39.19 kg/m   Physical Exam Vitals and nursing note reviewed.   Constitutional:      General: She is not in acute distress.    Appearance: She is well-developed.     Comments: NAD.  HENT:     Head: Normocephalic and atraumatic.     Right Ear: External ear normal.     Left Ear: External ear normal.     Nose: Nose normal.     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 Patient states she has had this for a long time.  Oropharynx and tonsillar pillars erythematous but no exudates, edema, asymmetry. Uvula midline. No trismus. Normal tongue movement, sublingual space, phonation. No intraoral lesions. Eyes:     General: No scleral icterus.    Conjunctiva/sclera: Conjunctivae normal.  Neck:     Comments: No neck tenderness, asymmetry. Full ROM without pain Cardiovascular:     Rate and Rhythm: Normal rate and regular rhythm.     Heart sounds: Normal heart sounds.  Pulmonary:     Effort: Pulmonary effort is normal.     Breath sounds: Normal breath sounds.  Musculoskeletal:        General: No deformity. Normal range of motion.     Cervical back: Normal range of motion and neck supple.  Skin:    General: Skin is warm and dry.     Capillary Refill: Capillary refill takes less than 2 seconds.  Neurological:     Mental Status: She is alert and oriented to person, place, and time.  Psychiatric:        Behavior: Behavior normal.        Thought Content: Thought content normal.        Judgment: Judgment normal.     ED Results / Procedures / Treatments   Labs (all labs ordered are listed, but only abnormal results are displayed) Labs Reviewed  GROUP A STREP BY PCR    EKG None  Radiology DG Chest 2 View  Result Date: 10/18/2019 CLINICAL DATA:  Cough EXAM: CHEST - 2 VIEW COMPARISON:  07/22/2018 FINDINGS: Normal heart size and mediastinal contours. No acute infiltrate or edema. No effusion or pneumothorax. No acute osseous findings. IMPRESSION: Negative chest Electronically Signed   By: 07/24/2018 M.D.   On: 10/18/2019 05:47    Procedures Procedures (including critical care time)  Medications Ordered in  ED Medications - No data to display  ED Course  I have reviewed the triage vital signs and the nursing notes.  Pertinent labs & imaging results that were available during my care of the patient were reviewed by me and considered in my medical decision making (see chart for details).    MDM Rules/Calculators/A&P                           This patient complains of throat irritation.  I obtained additional history from EMR and nursing note review.   Previous medical records available, nursing notes reviewed to obtain more history and assist with MDM  Highest on my differential diagnosis includes viral pharyngitis, inflammation from chronic cough.  Clinically unlikely to be strep. Throat exam shows mild erythema, otherwise benign. No concern for severe or life threatening process in throat, neck like ludwig's, RPA, PTA, epiglottitis.   I ordered imaging studies including chest x-ray.   I ordered strep test.   I ordered, reviewed and personally visualized and interpreted the above labs and/or imaging studies.  Chest x-ray negative. Strep negative.   I have re-evaluated the patient and no decline.   Will dc with symptomatic management for throat irritation likely from chronic cough and seasonal allergies. Antihistamine, delsym, warm water salt gargles, tylenol. Return precautions given.     Final Clinical Impression(s) / ED Diagnoses Final diagnoses:  Throat irritation  Chronic cough    Rx / DC Orders ED Discharge Orders    None       Kinnie Feil, PA-C 10/18/19 0946    Lennice Sites, DO 10/18/19 1240

## 2019-10-18 NOTE — ED Triage Notes (Signed)
Patient states it feels like something is sitting on the right side of her throat. Patient states she been feelings this way for about a week. Patient also states she woke up out of her sleep and could not breathe. Patient O2 sat is 100%

## 2019-10-18 NOTE — Discharge Instructions (Addendum)
You were seen in the ER for throat discomfort  I think this is all related to your ongoing cough possibly exacerbated by seasonal allergies. Strep test is negative.   Take a daily allergy medicine every day (claritin, zyrtec, allegra, etc)  Do warm water and salt gargles at least once daily. Drink warm liquids. Take acetaminophen 650 mg every 8 hours.   Take anti-tussive (anti-cough) medicine. Take Delsym twice a day (every 12 hours) to help stop your cough  Hopefully these interventions will help suppress and stop your cough and help with irritation of your throat

## 2019-12-23 ENCOUNTER — Other Ambulatory Visit: Payer: Self-pay

## 2019-12-23 ENCOUNTER — Encounter (HOSPITAL_BASED_OUTPATIENT_CLINIC_OR_DEPARTMENT_OTHER): Payer: Self-pay | Admitting: Emergency Medicine

## 2019-12-23 DIAGNOSIS — M791 Myalgia, unspecified site: Secondary | ICD-10-CM | POA: Insufficient documentation

## 2019-12-23 DIAGNOSIS — R05 Cough: Secondary | ICD-10-CM | POA: Diagnosis present

## 2019-12-23 DIAGNOSIS — J45909 Unspecified asthma, uncomplicated: Secondary | ICD-10-CM | POA: Diagnosis not present

## 2019-12-23 DIAGNOSIS — Z20822 Contact with and (suspected) exposure to covid-19: Secondary | ICD-10-CM | POA: Diagnosis not present

## 2019-12-23 DIAGNOSIS — J069 Acute upper respiratory infection, unspecified: Secondary | ICD-10-CM | POA: Diagnosis not present

## 2019-12-23 DIAGNOSIS — R0602 Shortness of breath: Secondary | ICD-10-CM | POA: Insufficient documentation

## 2019-12-23 LAB — SARS CORONAVIRUS 2 BY RT PCR (HOSPITAL ORDER, PERFORMED IN ~~LOC~~ HOSPITAL LAB): SARS Coronavirus 2: NEGATIVE

## 2019-12-23 NOTE — ED Triage Notes (Signed)
Sore throat, cough, and body aches since Friday.  No known covid exposure. Has not been vaccinated.

## 2019-12-24 ENCOUNTER — Emergency Department (HOSPITAL_BASED_OUTPATIENT_CLINIC_OR_DEPARTMENT_OTHER): Payer: 59

## 2019-12-24 ENCOUNTER — Emergency Department (HOSPITAL_BASED_OUTPATIENT_CLINIC_OR_DEPARTMENT_OTHER)
Admission: EM | Admit: 2019-12-24 | Discharge: 2019-12-24 | Disposition: A | Discharge status: Home / Self Care | Payer: 59 | Attending: Emergency Medicine | Admitting: Emergency Medicine

## 2019-12-24 DIAGNOSIS — J069 Acute upper respiratory infection, unspecified: Secondary | ICD-10-CM

## 2019-12-24 MED ORDER — DEXAMETHASONE SODIUM PHOSPHATE 10 MG/ML IJ SOLN
10.0000 mg | Freq: Once | INTRAMUSCULAR | Status: AC
  Administered 2019-12-24: 10 mg via INTRAMUSCULAR
  Filled 2019-12-24: qty 1

## 2019-12-24 MED ORDER — ALBUTEROL SULFATE HFA 108 (90 BASE) MCG/ACT IN AERS
2.0000 | INHALATION_SPRAY | RESPIRATORY_TRACT | 0 refills | Status: DC | PRN
Start: 2019-12-24 — End: 2022-03-01

## 2019-12-24 MED ORDER — ALBUTEROL SULFATE HFA 108 (90 BASE) MCG/ACT IN AERS
2.0000 | INHALATION_SPRAY | Freq: Once | RESPIRATORY_TRACT | Status: AC
  Administered 2019-12-24: 2 via RESPIRATORY_TRACT
  Filled 2019-12-24: qty 6.7

## 2019-12-24 NOTE — ED Notes (Signed)
Started cleocin 300 MG BID on 8/11

## 2019-12-24 NOTE — Discharge Instructions (Addendum)
You were seen today for upper respiratory symptoms.  Continue your inhaler given your history of asthma.  Tylenol or Motrin as needed for pain.  Your Covid test was negative today.

## 2019-12-24 NOTE — ED Provider Notes (Signed)
[facility] EMERGENCY DEPARTMENT Provider Note   CSN: [id number] Arrival date & time: 12/23/19  2218     History Chief Complaint  Patient presents with  . Sore Throat  . Cough    April Crane is a 29 y.o. female.  HPI     Is a 29 year old female who presents with cough, sore throat.  She has had several days of symptoms.  Symptoms initially started on Friday.  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  She has no known direct Covid contacts but states that one of the classes in the school had to quarantine secondary to Covid exposure.  She reports myalgias.  No fevers.  She reports some shortness of breath.  She has a history of asthma and has taken her inhaler with minimal relief.  She does report body aches.  She has not taken anything for symptoms.  Denies rash.  Past Medical History:  Diagnosis Date  . No pertinent past medical history   . Obstruction of colon (HCC)   . Renal disorder     Patient Active Problem List   Diagnosis Date Noted  . Ganglion cyst of dorsum of left wrist 03/01/2019  . Pain of joint of left ankle and foot 03/01/2019  . Chest pain   . Abdominal pain 07/14/2015  . Generalized weakness 07/14/2015  . AKI (acute kidney injury) (HCC) 07/13/2015  . Acute renal failure (ARF) (HCC) 07/13/2015    Past Surgical History:  Procedure Laterality Date  . ANTERIOR CRUCIATE LIGAMENT REPAIR    . ANTERIOR CRUCIATE LIGAMENT REPAIR       OB History    Gravida  0   Para  0   Term      Preterm      AB      Living        SAB      TAB      Ectopic      Multiple      Live Births              Family History  Problem Relation Age of Onset  . Asthma Sister   . Cancer Maternal Grandmother     Social History   Tobacco Use  . Smoking status: Never Smoker  . Smokeless tobacco: Never Used  Vaping Use  . Vaping Use: Never used  Substance Use Topics  .  Alcohol use: No  . Drug use: No    Home Medications Prior to Admission medications   Medication Sig Start Date End Date Taking? Authorizing Provider  clindamycin (CLEOCIN) 300 MG capsule Take 300 mg by mouth 2 (two) times daily.   Yes [provider]  acetaminophen (TYLENOL) 325 MG tablet Take 650 mg by mouth every 6 (six) hours as needed for mild pain or headache.    [provider]  albuterol (VENTOLIN HFA) 108 (90 Base) MCG/ACT inhaler Inhale 2 puffs into the lungs every 4 (four) hours as needed for wheezing or shortness of breath. 12/24/19   Konnor Vondrasek, Mayer Masker, MD  diphenhydrAMINE (BENADRYL) 25 MG tablet Take 1 tablet (25 mg total) by mouth every 6 (six) hours. 08/23/19   Fayrene Helper, PA-C  erythromycin ophthalmic ointment Place a 1/2 inch ribbon of ointment into the lower eyelid 2-3 times per day. 09/21/19   Placido Sou, PA-C  fluconazole (DIFLUCAN) 150 MG tablet Take 150 mg by mouth See admin instructions. 150mg  on day 1,  4 and 7 then stop    [provider]  metroNIDAZOLE (FLAGYL) 500 MG tablet Take 500 mg by mouth 2 (two) times daily. 7 day supply    [provider]  Multiple Vitamin (MULTIVITAMIN WITH MINERALS) TABS tablet Take 1 tablet by mouth daily.    [provider]  predniSONE (DELTASONE) 20 MG tablet 2 tabs po daily x 3 days 08/23/19   Fayrene Helper, PA-C    Allergies    Flagyl [metronidazole], Tetracyclines & related, Diflucan [fluconazole], Ibuprofen, and Other  Review of Systems   Review of Systems  Constitutional: Positive for chills.  HENT: Positive for sore throat.   Respiratory: Positive for cough, chest tightness and shortness of breath.   Cardiovascular: Negative for chest pain.  Gastrointestinal: Negative for abdominal pain, nausea and vomiting.  Genitourinary: Negative for dysuria.  All other systems reviewed and are negative.   Physical Exam Updated Vital Signs BP 112/76 (BP Location: Right Arm)   Pulse 91    Temp 98.3 F (36.8 C)   Resp 16   Ht 1.537 m (5' 0.5")   Wt 92.5 kg   SpO2 99%   BMI 39.19 kg/m   Physical Exam Vitals and nursing note reviewed.  Constitutional:      Appearance: She is well-developed. She is obese. She is not ill-appearing.  HENT:     Head: Normocephalic and atraumatic.     Right Ear: Tympanic membrane normal.     Left Ear: Tympanic membrane normal.     Mouth/Throat:     Mouth: Mucous membranes are moist.     Comments: Slight erythema posterior oropharynx, no exudate noted Eyes:     Pupils: Pupils are equal, round, and reactive to light.  Cardiovascular:     Rate and Rhythm: Normal rate and regular rhythm.     Heart sounds: Normal heart sounds.  Pulmonary:     Effort: Pulmonary effort is normal. No respiratory distress.     Breath sounds: Wheezing present.     Comments: Good air movement with occasional wheeze Abdominal:     General: Bowel sounds are normal.     Palpations: Abdomen is soft.  Musculoskeletal:     Cervical back: Neck supple.  Skin:    General: Skin is warm and dry.  Neurological:     Mental Status: She is alert and oriented to person, place, and time.  Psychiatric:        Mood and Affect: Mood normal.     ED Results / Procedures / Treatments   Labs (all labs ordered are listed, but only abnormal results are displayed) Labs Reviewed  SARS CORONAVIRUS 2 BY RT PCR (HOSPITAL ORDER, PERFORMED IN [facility])    EKG None  Radiology DG Chest Portable 1 View  Result Date: 12/24/2019 CLINICAL DATA:  Cough.  Sore throat and nausea.  Body aches. EXAM: PORTABLE CHEST 1 VIEW COMPARISON:  Radiograph 10/18/2019 FINDINGS: Mild chronic elevation of left hemidiaphragm.The cardiomediastinal contours are unchanged. The lungs are clear. Pulmonary vasculature is normal. No consolidation, pleural effusion, or pneumothorax. No acute osseous abnormalities are seen. IMPRESSION: No acute chest findings. Electronically Signed   By: Narda Rutherford M.D.   On: 12/24/2019 02:19    Procedures Procedures (including critical care time)  Medications Ordered in ED Medications  albuterol (VENTOLIN HFA) 108 (90 Base) MCG/ACT inhaler 2 puff (2 puffs Inhalation Given 12/24/19 0214)  dexamethasone (DECADRON) injection 10 mg (10 mg Intramuscular Given 12/24/19 0219)  ED Course  I have reviewed the triage vital signs and the nursing notes.  Pertinent labs & imaging results that were available during my care of the patient were reviewed by me and considered in my medical decision making (see chart for details).    MDM Rules/Calculators/A&P                           Patient presents with upper respiratory symptoms including sore throat, cough, myalgias.  She is overall nontoxic and vital signs are reassuring.  She has an occasional wheeze and has a history of asthma.  Patient was given an inhaler and Decadron.  Chest x-ray obtained to rule out pneumonia.  This was independently reviewed by myself and is negative.  Given current prevalence of COVID-19 in the community, testing for COVID-19 was sent and this is negative.  She is without a known sick contact.  Do not feel she needs to quarantine at this time.  Patient remains hemodynamically stable and O2 sats greater than 99%.  Recommend supportive measures at home including Tylenol Motrin for pain, hydration, and inhaler as needed.  After history, exam, and medical workup I feel the patient has been appropriately medically screened and is safe for discharge home. Pertinent diagnoses were discussed with the patient. Patient was given return precautions.  RANESSA KOSTA was evaluated in Emergency Department on 12/24/2019 for the symptoms described in the history of present illness. She was evaluated in the context of the global COVID-19 pandemic, which necessitated consideration that the patient might be at risk for infection with the SARS-CoV-2 virus that causes COVID-19. Institutional  protocols and algorithms that pertain to the evaluation of patients at risk for COVID-19 are in a state of rapid change based on information released by regulatory bodies including the CDC and federal and state organizations. These policies and algorithms were followed during the patient's care in the ED.   Final Clinical Impression(s) / ED Diagnoses Final diagnoses:  Viral URI with cough    Rx / DC Orders ED Discharge Orders         Ordered    albuterol (VENTOLIN HFA) 108 (90 Base) MCG/ACT inhaler  Every 4 hours PRN     Discontinue  Reprint     12/24/19 0231           Shon Baton, MD 12/24/19 0301

## 2020-01-31 ENCOUNTER — Emergency Department (HOSPITAL_BASED_OUTPATIENT_CLINIC_OR_DEPARTMENT_OTHER)
Admission: EM | Admit: 2020-01-31 | Discharge: 2020-01-31 | Disposition: A | Discharge status: Home / Self Care | Payer: 59 | Attending: Emergency Medicine | Admitting: Emergency Medicine

## 2020-01-31 ENCOUNTER — Encounter (HOSPITAL_BASED_OUTPATIENT_CLINIC_OR_DEPARTMENT_OTHER): Payer: Self-pay

## 2020-01-31 ENCOUNTER — Other Ambulatory Visit: Payer: Self-pay

## 2020-01-31 DIAGNOSIS — Z20822 Contact with and (suspected) exposure to covid-19: Secondary | ICD-10-CM | POA: Insufficient documentation

## 2020-01-31 DIAGNOSIS — J069 Acute upper respiratory infection, unspecified: Secondary | ICD-10-CM | POA: Diagnosis not present

## 2020-01-31 DIAGNOSIS — R05 Cough: Secondary | ICD-10-CM | POA: Diagnosis present

## 2020-01-31 LAB — RESPIRATORY PANEL BY RT PCR (FLU A&B, COVID)
Influenza A by PCR: NEGATIVE
Influenza B by PCR: NEGATIVE
SARS Coronavirus 2 by RT PCR: NEGATIVE

## 2020-01-31 LAB — PREGNANCY, URINE: Preg Test, Ur: NEGATIVE

## 2020-01-31 MED ORDER — ONDANSETRON 4 MG PO TBDP
4.0000 mg | ORAL_TABLET | Freq: Three times a day (TID) | ORAL | 0 refills | Status: DC | PRN
Start: 2020-01-31 — End: 2020-04-10

## 2020-01-31 MED ORDER — PREDNISONE 50 MG PO TABS
60.0000 mg | ORAL_TABLET | Freq: Once | ORAL | Status: AC
  Administered 2020-01-31: 60 mg via ORAL
  Filled 2020-01-31: qty 1

## 2020-01-31 MED ORDER — PREDNISONE 20 MG PO TABS
40.0000 mg | ORAL_TABLET | Freq: Every day | ORAL | 0 refills | Status: AC
Start: 2020-01-31 — End: 2020-02-04

## 2020-01-31 MED ORDER — BENZONATATE 100 MG PO CAPS
100.0000 mg | ORAL_CAPSULE | Freq: Three times a day (TID) | ORAL | 0 refills | Status: DC
Start: 2020-01-31 — End: 2020-04-10

## 2020-01-31 NOTE — ED Provider Notes (Signed)
[facility] EMERGENCY DEPARTMENT Provider Note   CSN: [id number] Arrival date & time: 01/31/20  1927     History Chief Complaint  Patient presents with  . Cough    April Crane is a 29 y.o. female who presents for evaluation of 6 days of cough, congestion.  She reports that cough is productive of phlegm.  No hemoptysis.  She reports that occasionally, she will cough so badly that she will have episodes of posttussive emesis.  She states that when she does have this, it looks like the food that she has been eating.  No vomiting blood.  She states she has not had any other episodes of vomiting and has been able to tolerate p.o. no other times.  She reports that occasionally after she has these episodes where she coughs very hard, she will have some epigastric pain.  Otherwise has not had any abdominal pain.  No fevers that she knows of.  She reports that she has been diagnosed with asthma no started on inhaler.  She has not gotten vaccinated for Covid.  No Covid exposure that she knows of.  The history is provided by the patient.       Past Medical History:  Diagnosis Date  . No pertinent past medical history   . Obstruction of colon (HCC)   . Renal disorder     Patient Active Problem List   Diagnosis Date Noted  . Ganglion cyst of dorsum of left wrist 03/01/2019  . Pain of joint of left ankle and foot 03/01/2019  . Chest pain   . Abdominal pain 07/14/2015  . Generalized weakness 07/14/2015  . AKI (acute kidney injury) (HCC) 07/13/2015  . Acute renal failure (ARF) (HCC) 07/13/2015    Past Surgical History:  Procedure Laterality Date  . ANTERIOR CRUCIATE LIGAMENT REPAIR    . ANTERIOR CRUCIATE LIGAMENT REPAIR       OB History    Gravida  0   Para  0   Term      Preterm      AB      Living        SAB      TAB      Ectopic      Multiple      Live Births              Family History  Problem Relation Age of Onset  . Asthma Sister   .  Cancer Maternal Grandmother     Social History   Tobacco Use  . Smoking status: Never Smoker  . Smokeless tobacco: Never Used  Vaping Use  . Vaping Use: Never used  Substance Use Topics  . Alcohol use: No  . Drug use: No    Home Medications Prior to Admission medications   Medication Sig Start Date End Date Taking? Authorizing Provider  acetaminophen (TYLENOL) 325 MG tablet Take 650 mg by mouth every 6 (six) hours as needed for mild pain or headache.    [provider]  albuterol (VENTOLIN HFA) 108 (90 Base) MCG/ACT inhaler Inhale 2 puffs into the lungs every 4 (four) hours as needed for wheezing or shortness of breath. 12/24/19   Horton, Mayer Masker, MD  benzonatate (TESSALON) 100 MG capsule Take 1 capsule (100 mg total) by mouth every 8 (eight) hours. 01/31/20   Maxwell Caul, PA-C  clindamycin (CLEOCIN) 300 MG capsule Take 300 mg by mouth 2 (two) times daily.    [provider]  diphenhydrAMINE (BENADRYL) 25 MG tablet Take 1 tablet (25 mg total) by mouth every 6 (six) hours. 08/23/19   Fayrene Helper, PA-C  erythromycin ophthalmic ointment Place a 1/2 inch ribbon of ointment into the lower eyelid 2-3 times per day. 09/21/19   Placido Sou, PA-C  fluconazole (DIFLUCAN) 150 MG tablet Take 150 mg by mouth See admin instructions. 150mg  on day 1, 4 and 7 then stop    [provider]  metroNIDAZOLE (FLAGYL) 500 MG tablet Take 500 mg by mouth 2 (two) times daily. 7 day supply    [provider]  Multiple Vitamin (MULTIVITAMIN WITH MINERALS) TABS tablet Take 1 tablet by mouth daily.    [provider]  ondansetron (ZOFRAN ODT) 4 MG disintegrating tablet Take 1 tablet (4 mg total) by mouth every 8 (eight) hours as needed for nausea or vomiting. 01/31/20   02/02/20, PA-C  predniSONE (DELTASONE) 20 MG tablet Take 2 tablets (40 mg total) by mouth daily for 4 days. 01/31/20 02/04/20  02/06/20, PA-C    Allergies    Flagyl  [metronidazole], Tetracyclines & related, Diflucan [fluconazole], Ibuprofen, and Other  Review of Systems   Review of Systems  Constitutional: Negative for fever.  Respiratory: Positive for cough. Negative for shortness of breath.   Cardiovascular: Negative for chest pain.  Gastrointestinal: Positive for abdominal pain (intermittent) and vomiting (intermittent). Negative for nausea.  All other systems reviewed and are negative.   Physical Exam Updated Vital Signs BP 128/84 (BP Location: Left Arm)   Pulse 82   Temp 98.4 F (36.9 C) (Oral)   Resp 19   Ht 5' (1.524 m)   Wt 90.3 kg   SpO2 99%   BMI 38.86 kg/m   Physical Exam Vitals and nursing note reviewed.  Constitutional:      Appearance: She is well-developed.  HENT:     Head: Normocephalic and atraumatic.  Eyes:     General: No scleral icterus.       Right eye: No discharge.        Left eye: No discharge.     Conjunctiva/sclera: Conjunctivae normal.  Pulmonary:     Effort: Pulmonary effort is normal.     Comments: Lungs clear to auscultation bilaterally.  Symmetric chest rise.  No wheezing, rales, rhonchi. No evidence of respiratory distress. Able to speak in full sentences.  Abdominal:     Comments: Abdomen is soft, non-distended, non-tender. No rigidity, No guarding. No peritoneal signs.  Skin:    General: Skin is warm and dry.  Neurological:     Mental Status: She is alert.  Psychiatric:        Speech: Speech normal.        Behavior: Behavior normal.     ED Results / Procedures / Treatments   Labs (all labs ordered are listed, but only abnormal results are displayed) Labs Reviewed  RESPIRATORY PANEL BY RT PCR (FLU A&B, COVID)  PREGNANCY, URINE    EKG None  Radiology No results found.  Procedures Procedures (including critical care time)  Medications Ordered in ED Medications  predniSONE (DELTASONE) tablet 60 mg (60 mg Oral Given 01/31/20 2311)    ED Course  I have reviewed the triage vital  signs and the nursing notes.  Pertinent labs & imaging results that were available during my care of the patient were reviewed by me and considered in my medical decision making (see chart for details).    MDM Rules/Calculators/A&P  29 year old female who presents for evaluation of 6 days of coughing.  Reports she will occasionally cough so bad that she will have episodes of posttussive emesis.  She also reports that occasionally she will get epigastric abdominal pain after coughing.  No vaccinated for Covid.  No fevers.  On initially arrival, she is afebrile, nontoxic-appearing.  Vital signs are stable.  On exam, she is well-appearing.  She is intermittently coughing but no evidence of respiratory distress.  Able speak in full sentences without difficulty.  Benign abdominal exam.  Consider COVID-19 infection.  Also consider viral URI.  Exam concerning for pneumonia.  Covid test ordered at triage.  Covid neg. Upreg negative.   Discussed results patient.  Patient has albuterol inhaler that she is at home.  We will put her on a short course of prednisone to help with symptomatic relief.  Patient encouraged at home supportive care measures. At this time, patient exhibits no emergent life-threatening condition that require further evaluation in ED. Patient had ample opportunity for questions and discussion. All patient's questions were answered with full understanding. Strict return precautions discussed. Patient expresses understanding and agreement to plan.   April Crane was evaluated in Emergency Department on 01/31/2020 for the symptoms described in the history of present illness. She was evaluated in the context of the global COVID-19 pandemic, which necessitated consideration that the patient might be at risk for infection with the SARS-CoV-2 virus that causes COVID-19. Institutional protocols and algorithms that pertain to the evaluation of patients at risk for COVID-19  are in a state of rapid change based on information released by regulatory bodies including the CDC and federal and state organizations. These policies and algorithms were followed during the patient's care in the ED.  Portions of this note were generated with Scientist, clinical (histocompatibility and immunogenetics). Dictation errors may occur despite best attempts at proofreading.   Final Clinical Impression(s) / ED Diagnoses Final diagnoses:  Viral URI with cough    Rx / DC Orders ED Discharge Orders         Ordered    predniSONE (DELTASONE) 20 MG tablet  Daily        01/31/20 2310    benzonatate (TESSALON) 100 MG capsule  Every 8 hours        01/31/20 2310    ondansetron (ZOFRAN ODT) 4 MG disintegrating tablet  Every 8 hours PRN        01/31/20 2310           Rosana Hoes 01/31/20 2345    Arby Barrette, MD 02/10/20 1308

## 2020-01-31 NOTE — ED Triage Notes (Addendum)
Pt c/o flu like sx and "burning in my stomach"-sx started 9/17-NAD-steady gait

## 2020-01-31 NOTE — Discharge Instructions (Signed)
Your Covid test today was negative.  As we discussed, this could be a viral upper respiratory infection.  I have prescribed you some prednisone to help with coughing as well as some additional medications help with cough.  Make sure you are staying hydrated drinking plenty of fluids.  Return the emergency department for any worsening cough, difficulty breathing, persistent vomiting that occurs without coughing, persistent fever or any other worsening or concerning symptoms.

## 2020-03-03 ENCOUNTER — Inpatient Hospital Stay: Payer: 59 | Admitting: Internal Medicine

## 2020-04-10 ENCOUNTER — Other Ambulatory Visit: Payer: Self-pay

## 2020-04-10 ENCOUNTER — Ambulatory Visit: Payer: Medicaid Other | Admitting: Allergy and Immunology

## 2020-04-10 ENCOUNTER — Encounter: Payer: Self-pay | Admitting: Allergy and Immunology

## 2020-04-10 DIAGNOSIS — Z889 Allergy status to unspecified drugs, medicaments and biological substances status: Secondary | ICD-10-CM | POA: Insufficient documentation

## 2020-04-10 DIAGNOSIS — Z881 Allergy status to other antibiotic agents status: Secondary | ICD-10-CM | POA: Diagnosis not present

## 2020-04-10 NOTE — Patient Instructions (Addendum)
Drug allergy The patients history suggests fluconazole or metronidazole allergy.  Skin testing for medication, with the exception of penicillin, has poor sensitivity and specificity.  We do not have either of these 2 medications on hand. If percutaneous/intradermal skin testing were pursued and found to be nonreactive, and inpatient challenge plus/minus desensitization would be the next step.  These evaluations are best performed by [facility] based allergy programs, such as [facility] allergy, [facility] allergy, or [facility] allergy.   For now, avoid fluconazole and metronidazole.

## 2020-04-10 NOTE — Progress Notes (Signed)
New Patient Note  RE: April Crane MRN: [id number] DOB: 01/29/1991 Date of Office Visit: 04/10/2020  Referring provider: No ref. provider found Primary care provider: Patient, No Pcp Per  Chief Complaint: Allergic Reaction  History of present illness: April Crane is a 29 y.o. female presenting today for evaluation of allergic reaction.  She reports that approximately 2 months ago she was prescribed Diflucan and metronidazole.  She took both of these medications at night before going to bed and woke up in the morning with facial swelling and dysphonia.  She went to the emergency department was treated with diphenhydramine.  She is not sure if she was treated with a corticosteroid.  The symptoms resolved over the next 48 hours without recurrence.  She was sent here by the health department   Assessment and plan: Drug allergy The patients history suggests fluconazole or metronidazole allergy.  Skin testing for medication, with the exception of penicillin, has poor sensitivity and specificity.  We do not have either of these 2 medications on hand. If percutaneous/intradermal skin testing were pursued and found to be nonreactive, and inpatient challenge plus/minus desensitization would be the next step.  These evaluations are best performed by [facility] based allergy programs, such as [facility] allergy, [facility] allergy, or [facility] allergy.   For now, avoid fluconazole and metronidazole.  Physical examination: Blood pressure 106/78, pulse 92, temperature 98.1 F (36.7 C), temperature source Temporal, resp. rate 16, height 5' 1.5" (1.562 m), weight 198 lb 9.6 oz (90.1 kg), SpO2 100 %.  General: Alert, interactive, in no acute distress. Neck: Supple without lymphadenopathy. Lungs: Clear to auscultation without wheezing, rhonchi or rales. CV: Normal S1, S2 without murmurs. Abdomen: Nondistended, nontender. Skin: Warm and dry, without lesions or rashes. Extremities:  No clubbing,  cyanosis or edema. Neuro:   Grossly intact.  Review of systems:  Review of systems negative except as noted in HPI / PMHx or noted below: Review of Systems  Constitutional: Negative.   HENT: Negative.   Eyes: Negative.   Respiratory: Negative.   Cardiovascular: Negative.   Gastrointestinal: Negative.   Genitourinary: Negative.   Musculoskeletal: Negative.   Skin: Negative.   Neurological: Negative.   Endo/Heme/Allergies: Negative.   Psychiatric/Behavioral: Negative.     Past medical history:  Past Medical History:  Diagnosis Date  . No pertinent past medical history   . Obstruction of colon (HCC)   . Renal disorder     Past surgical history:  Past Surgical History:  Procedure Laterality Date  . ANTERIOR CRUCIATE LIGAMENT REPAIR    . ANTERIOR CRUCIATE LIGAMENT REPAIR      Family history: Family History  Problem Relation Age of Onset  . Asthma Sister   . Cancer Maternal Grandmother   . Allergic rhinitis Neg Hx   . Angioedema Neg Hx   . Atopy Neg Hx   . Eczema Neg Hx   . Immunodeficiency Neg Hx   . Urticaria Neg Hx     Social history: Social History   Socioeconomic History  . Marital status: Single    Spouse name: Not on file  . Number of children: Not on file  . Years of education: Not on file  . Highest education level: Not on file  Occupational History  . Not on file  Tobacco Use  . Smoking status: Never Smoker  . Smokeless tobacco: Never Used  Vaping Use  . Vaping Use: Never used  Substance and Sexual Activity  . Alcohol use: No  .  Drug use: No  . Sexual activity: Not on file  Other Topics Concern  . Not on file  Social History Narrative  . Not on file   Social Determinants of Health   Financial Resource Strain:   . Difficulty of Paying Living Expenses: Not on file  Food Insecurity:   . Worried About Programme researcher, broadcasting/film/video in the Last Year: Not on file  . Ran Out of Food in the Last Year: Not on file  Transportation Needs:   . Lack of  Transportation (Medical): Not on file  . Lack of Transportation (Non-Medical): Not on file  Physical Activity:   . Days of Exercise per Week: Not on file  . Minutes of Exercise per Session: Not on file  Stress:   . Feeling of Stress : Not on file  Social Connections:   . Frequency of Communication with Friends and Family: Not on file  . Frequency of Social Gatherings with Friends and Family: Not on file  . Attends Religious Services: Not on file  . Active Member of Clubs or Organizations: Not on file  . Attends Banker Meetings: Not on file  . Marital Status: Not on file  Intimate Partner Violence:   . Fear of Current or Ex-Partner: Not on file  . Emotionally Abused: Not on file  . Physically Abused: Not on file  . Sexually Abused: Not on file    Environmental History: The patient lives in a duplex with carpeting throughout, heat [place], and window air conditioning units.  There are rabbits in the home which do not have access to her bedroom.  There is no known mold/water damage in the home.  She is a non-smoker.  Current Outpatient Medications  Medication Sig Dispense Refill  . acetaminophen (TYLENOL) 325 MG tablet Take 650 mg by mouth every 6 (six) hours as needed for mild pain or headache.    . albuterol (VENTOLIN HFA) 108 (90 Base) MCG/ACT inhaler Inhale 2 puffs into the lungs every 4 (four) hours as needed for wheezing or shortness of breath. 18 g 0  . Multiple Vitamin (MULTIVITAMIN WITH MINERALS) TABS tablet Take 1 tablet by mouth daily.    Marland Kitchen erythromycin ophthalmic ointment Place a 1/2 inch ribbon of ointment into the lower eyelid 2-3 times per day. (Patient not taking: Reported on 04/10/2020) 3.5 g 0   No current facility-administered medications for this visit.    Known medication allergies: Allergies  Allergen Reactions  . Flagyl [Metronidazole] Anaphylaxis  . Tetracyclines & Related Anaphylaxis  . Diflucan [Fluconazole] Swelling  . Ibuprofen Other (See  Comments)    Damaged kidneys  . Other Nausea And Vomiting    Antibiotic (patient cannot recall name/elementary school)    I appreciate the opportunity to take part in [place] care. Please do not hesitate to contact me with questions.  Sincerely,   R. Jorene Guest, MD

## 2020-04-10 NOTE — Assessment & Plan Note (Addendum)
The patients history suggests fluconazole or metronidazole allergy.  Skin testing for medication, with the exception of penicillin, has poor sensitivity and specificity.  We do not have either of these 2 medications on hand. If percutaneous/intradermal skin testing were pursued and found to be nonreactive, and inpatient challenge plus/minus desensitization would be the next step.  These evaluations are best performed by [facility] based allergy programs, such as [facility] allergy, [facility] allergy, or [facility] allergy.   For now, avoid fluconazole and metronidazole.

## 2020-04-11 ENCOUNTER — Telehealth: Payer: Self-pay | Admitting: Allergy and Immunology

## 2020-04-11 NOTE — Telephone Encounter (Signed)
Please advised to calling sherae back thank you

## 2020-04-11 NOTE — Telephone Encounter (Signed)
[facility], returning Dr. Sheran Fava call.

## 2020-04-14 NOTE — Telephone Encounter (Signed)
Called, No answer. Left a message asking her to read the recommendations in my note and call me back if needed. Thanks.

## 2020-05-15 ENCOUNTER — Other Ambulatory Visit: Payer: Self-pay

## 2020-05-15 ENCOUNTER — Emergency Department (HOSPITAL_COMMUNITY)
Admission: EM | Admit: 2020-05-15 | Discharge: 2020-05-15 | Disposition: A | Discharge status: Home / Self Care | Payer: 59 | Attending: Family Medicine | Admitting: Family Medicine

## 2020-05-15 ENCOUNTER — Encounter (HOSPITAL_COMMUNITY): Payer: Self-pay

## 2020-05-15 ENCOUNTER — Ambulatory Visit (HOSPITAL_COMMUNITY)
Admission: EM | Admit: 2020-05-15 | Discharge: 2020-05-15 | Disposition: A | Discharge status: Home / Self Care | Payer: 59 | Source: Home / Self Care

## 2020-05-15 DIAGNOSIS — R519 Headache, unspecified: Secondary | ICD-10-CM | POA: Diagnosis present

## 2020-05-15 DIAGNOSIS — Z5321 Procedure and treatment not carried out due to patient leaving prior to being seen by health care provider: Secondary | ICD-10-CM | POA: Insufficient documentation

## 2020-05-15 DIAGNOSIS — U071 COVID-19: Secondary | ICD-10-CM | POA: Insufficient documentation

## 2020-05-15 DIAGNOSIS — H9209 Otalgia, unspecified ear: Secondary | ICD-10-CM | POA: Insufficient documentation

## 2020-05-15 DIAGNOSIS — J029 Acute pharyngitis, unspecified: Secondary | ICD-10-CM | POA: Insufficient documentation

## 2020-05-15 DIAGNOSIS — J069 Acute upper respiratory infection, unspecified: Secondary | ICD-10-CM

## 2020-05-15 MED ORDER — PROMETHAZINE-DM 6.25-15 MG/5ML PO SYRP
5.0000 mL | ORAL_SOLUTION | Freq: Four times a day (QID) | ORAL | 0 refills | Status: DC | PRN
Start: 2020-05-15 — End: 2021-03-03

## 2020-05-15 MED ORDER — ACETAMINOPHEN 325 MG PO TABS
650.0000 mg | ORAL_TABLET | Freq: Once | ORAL | Status: AC | PRN
  Administered 2020-05-15: 650 mg via ORAL

## 2020-05-15 MED ORDER — ACETAMINOPHEN 325 MG PO TABS
650.0000 mg | ORAL_TABLET | Freq: Once | ORAL | Status: DC
  Filled 2020-05-15: qty 2

## 2020-05-15 NOTE — ED Triage Notes (Signed)
Pt POV with c/o headache, body aches, ear pain, sore throat starting yesterday.

## 2020-05-15 NOTE — ED Provider Notes (Signed)
[facility]    CSN: [id number] Arrival date & time: 05/15/20  1359      History   Chief Complaint No chief complaint on file.   HPI April Crane is a 30 y.o. female.   Here today with 2 day history of headache, back pain, ear pressure, cough, fever, body aches. Denies CP, SOB, N/V/D, abdominal pain. Taking tylenol x 1 dose with minimal relief. Denies chronic medical problems. COVID exposure at work. UTD on COVID vaccines.      Past Medical History:  Diagnosis Date  . No pertinent past medical history   . Obstruction of colon ([place])   . Renal disorder     Patient Active Problem List   Diagnosis Date Noted  . Drug allergy 04/10/2020  . Ganglion cyst of dorsum of left wrist 03/01/2019  . Pain of joint of left ankle and foot 03/01/2019  . Chest pain   . Abdominal pain 07/14/2015  . Generalized weakness 07/14/2015  . AKI (acute kidney injury) ([place]) 07/13/2015  . Acute renal failure (ARF) ([place]) 07/13/2015    Past Surgical History:  Procedure Laterality Date  . ANTERIOR CRUCIATE LIGAMENT REPAIR    . ANTERIOR CRUCIATE LIGAMENT REPAIR      OB History    Gravida  0   Para  0   Term      Preterm      AB      Living        SAB      IAB      Ectopic      Multiple      Live Births               Home Medications    Prior to Admission medications   Medication Sig Start Date End Date Taking? Authorizing Provider  promethazine-dextromethorphan (PROMETHAZINE-DM) 6.25-15 MG/5ML syrup Take 5 mLs by mouth 4 (four) times daily as needed for cough. 05/15/20  Yes Volney American, PA-C  acetaminophen (TYLENOL) 325 MG tablet Take 650 mg by mouth every 6 (six) hours as needed for mild pain or headache.    [provider]  albuterol (VENTOLIN HFA) 108 (90 Base) MCG/ACT inhaler Inhale 2 puffs into the lungs every 4 (four) hours as needed for wheezing or shortness of breath. 12/24/19   Horton, Barbette Hair, MD  erythromycin ophthalmic  ointment Place a 1/2 inch ribbon of ointment into the lower eyelid 2-3 times per day. Patient not taking: Reported on 04/10/2020 09/21/19   Rayna Sexton, PA-C  Multiple Vitamin (MULTIVITAMIN WITH MINERALS) TABS tablet Take 1 tablet by mouth daily.    [provider]    Family History Family History  Problem Relation Age of Onset  . Asthma Sister   . Cancer Maternal Grandmother   . Allergic rhinitis Neg Hx   . Angioedema Neg Hx   . Atopy Neg Hx   . Eczema Neg Hx   . Immunodeficiency Neg Hx   . Urticaria Neg Hx     Social History Social History   Tobacco Use  . Smoking status: Never Smoker  . Smokeless tobacco: Never Used  Vaping Use  . Vaping Use: Never used  Substance Use Topics  . Alcohol use: No  . Drug use: No     Allergies   Flagyl [metronidazole], Tetracyclines & related, Diflucan [fluconazole], Ibuprofen, and Other   Review of Systems Review of Systems PER HPI    Physical Exam Triage Vital Signs ED Triage Vitals  Enc Vitals Group     BP 05/15/20 1610 109/64     Pulse Rate 05/15/20 1608 (!) 109     Resp 05/15/20 1608 18     Temp 05/15/20 1608 100 F (37.8 C)     Temp Source 05/15/20 1608 Oral     SpO2 05/15/20 1608 100 %     Weight --      Height --      Head Circumference --      Peak Flow --      Pain Score 05/15/20 1607 8     Pain Loc --      Pain Edu? --      Excl. in GC? --    No data found.  Updated Vital Signs BP 109/64 (BP Location: Right Arm)   Pulse (!) 109   Temp 100 F (37.8 C) (Oral)   Resp 18   LMP 05/05/2020 (Approximate)   SpO2 100%   Visual Acuity Right Eye Distance:   Left Eye Distance:   Bilateral Distance:    Right Eye Near:   Left Eye Near:    Bilateral Near:     Physical Exam Vitals and nursing note reviewed.  Constitutional:      Appearance: Normal appearance. She is not ill-appearing.  HENT:     Head: Atraumatic.     Right Ear: Tympanic membrane normal.     Left Ear: Tympanic membrane  normal.     Nose: Rhinorrhea present.     Mouth/Throat:     Mouth: Mucous membranes are moist.     Pharynx: Posterior oropharyngeal erythema present.  Eyes:     Extraocular Movements: Extraocular movements intact.     Conjunctiva/sclera: Conjunctivae normal.  Cardiovascular:     Rate and Rhythm: Normal rate and regular rhythm.     Heart sounds: Normal heart sounds.  Pulmonary:     Effort: Pulmonary effort is normal. No respiratory distress.     Breath sounds: Normal breath sounds. No wheezing or rales.  Abdominal:     General: Bowel sounds are normal. There is no distension.     Palpations: Abdomen is soft.     Tenderness: There is no abdominal tenderness. There is no guarding.  Musculoskeletal:        General: Normal range of motion.     Cervical back: Normal range of motion and neck supple.  Skin:    General: Skin is warm and dry.  Neurological:     Mental Status: She is alert and oriented to person, place, and time.  Psychiatric:        Mood and Affect: Mood normal.        Thought Content: Thought content normal.        Judgment: Judgment normal.      UC Treatments / Results  Labs (all labs ordered are listed, but only abnormal results are displayed) Labs Reviewed  SARS CORONAVIRUS 2 (TAT 6-24 HRS)    EKG   Radiology No results found.  Procedures Procedures (including critical care time)  Medications Ordered in UC Medications - No data to display  Initial Impression / Assessment and Plan / UC Course  I have reviewed the triage vital signs and the nursing notes.  Pertinent labs & imaging results that were available during my care of the patient were reviewed by me and considered in my medical decision making (see chart for details).     Low grade fever and tachycardia in triage, otherwise well appearing and  normal vital signs. COVID pcr pending, discussed OTC pain relievers, phenergan DM, rest, hydration, strict return precautions for worsening sxs. Work  note given.   Final Clinical Impressions(s) / UC Diagnoses   Final diagnoses:  Viral URI with cough   Discharge Instructions   None    ED Prescriptions    Medication Sig Dispense Auth. Provider   promethazine-dextromethorphan (PROMETHAZINE-DM) 6.25-15 MG/5ML syrup Take 5 mLs by mouth 4 (four) times daily as needed for cough. 100 mL Particia Nearing, [place]     PDMP not reviewed this encounter.   Particia Nearing, [place] 05/15/20 1745

## 2020-05-15 NOTE — ED Triage Notes (Signed)
Pt presents with headaches, back pain, ear aches and cough X 2 days. Pt states when she cough her chest hurts.

## 2020-05-16 LAB — SARS CORONAVIRUS 2 (TAT 6-24 HRS): SARS Coronavirus 2: POSITIVE — AB

## 2020-05-26 ENCOUNTER — Ambulatory Visit: Payer: Self-pay | Admitting: Allergy

## 2020-10-03 ENCOUNTER — Other Ambulatory Visit: Payer: Self-pay

## 2020-10-03 ENCOUNTER — Encounter (HOSPITAL_COMMUNITY): Payer: Self-pay | Admitting: Obstetrics & Gynecology

## 2020-10-03 ENCOUNTER — Inpatient Hospital Stay (HOSPITAL_COMMUNITY)
Admission: AD | Admit: 2020-10-03 | Discharge: 2020-10-04 | Disposition: A | Discharge status: Home / Self Care | Payer: Medicaid Other | Attending: Obstetrics & Gynecology | Admitting: Obstetrics & Gynecology

## 2020-10-03 DIAGNOSIS — Z3A14 14 weeks gestation of pregnancy: Secondary | ICD-10-CM | POA: Insufficient documentation

## 2020-10-03 DIAGNOSIS — R1010 Upper abdominal pain, unspecified: Secondary | ICD-10-CM | POA: Diagnosis not present

## 2020-10-03 DIAGNOSIS — O26892 Other specified pregnancy related conditions, second trimester: Secondary | ICD-10-CM | POA: Diagnosis present

## 2020-10-03 DIAGNOSIS — Z79899 Other long term (current) drug therapy: Secondary | ICD-10-CM | POA: Diagnosis not present

## 2020-10-03 LAB — POCT PREGNANCY, URINE: Preg Test, Ur: POSITIVE — AB

## 2020-10-03 MED ORDER — SIMETHICONE 80 MG PO CHEW
80.0000 mg | CHEWABLE_TABLET | Freq: Once | ORAL | Status: AC
  Administered 2020-10-04: 80 mg via ORAL
  Filled 2020-10-03: qty 1

## 2020-10-03 NOTE — MAU Note (Signed)
Pt stated she is [redacted] weeks pregnant and is having abd pain and tighting when she sits up . Pain stared about 30 min ago. When she lays down the pain goes away. Denies any vag bleeding or discharge at this time.

## 2020-10-03 NOTE — MAU Provider Note (Signed)
Chief Complaint:  No chief complaint on file.   Event Date/Time   First Provider Initiated Contact with Patient 10/03/20 2332     HPI: April Crane is a 30 y.o. G1P0 at 33w5dwho presents to maternity admissions reporting intermittent upper abdominal pain which started at 2200 tonight.   Pain went away when she laid down.  .No N/V associated with it. Did not call her doctor.  Has never really had this pain before.     She denies LOF, vaginal bleeding, vaginal itching/burning, urinary symptoms, h/a, dizziness, n/v, diarrhea, constipation or fever/chills.    Abdominal Pain This is a new problem. The current episode started today. The problem occurs intermittently. The problem has been resolved. The pain is located in the epigastric region. The quality of the pain is cramping and dull. The abdominal pain does not radiate. Pertinent negatives include no anorexia, constipation, diarrhea, dysuria, fever, frequency, headaches, myalgias, nausea or vomiting. Nothing aggravates the pain. The pain is relieved by recumbency. She has tried nothing for the symptoms.    RN Note: Pt stated she is [redacted] weeks pregnant and is having abd pain and tighting when she sits up . Pain stared about 30 min ago. When she lays down the pain goes away. Denies any vag bleeding or discharge at this time.   Past Medical History: Past Medical History:  Diagnosis Date  . No pertinent past medical history   . Obstruction of colon ([facility])   . Renal disorder     Past obstetric history: OB History  Gravida Para Term Preterm AB Living  1 0          SAB IAB Ectopic Multiple Live Births               # Outcome Date GA Lbr Len/2nd Weight Sex Delivery Anes PTL Lv  1 Current             Past Surgical History: Past Surgical History:  Procedure Laterality Date  . ANTERIOR CRUCIATE LIGAMENT REPAIR    . ANTERIOR CRUCIATE LIGAMENT REPAIR      Family History: Family History  Problem Relation Age of Onset  . Asthma Sister    . Cancer Maternal Grandmother   . Allergic rhinitis Neg Hx   . Angioedema Neg Hx   . Atopy Neg Hx   . Eczema Neg Hx   . Immunodeficiency Neg Hx   . Urticaria Neg Hx     Social History: Social History   Tobacco Use  . Smoking status: Never Smoker  . Smokeless tobacco: Never Used  Vaping Use  . Vaping Use: Never used  Substance Use Topics  . Alcohol use: No  . Drug use: No    Allergies:  Allergies  Allergen Reactions  . Flagyl [Metronidazole] Anaphylaxis  . Tetracyclines & Related Anaphylaxis  . Diflucan [Fluconazole] Swelling  . Ibuprofen Other (See Comments)    Damaged kidneys  . Other Nausea And Vomiting    Antibiotic (patient cannot recall name/elementary school)    Meds:  Medications Prior to Admission  Medication Sig Dispense Refill Last Dose  . Prenatal Vit-Fe Fumarate-FA (PRENATAL MULTIVITAMIN) TABS tablet Take 1 tablet by mouth daily at 12 noon.     Marland Kitchen acetaminophen (TYLENOL) 325 MG tablet Take 650 mg by mouth every 6 (six) hours as needed for mild pain or headache.     . albuterol (VENTOLIN HFA) 108 (90 Base) MCG/ACT inhaler Inhale 2 puffs into the lungs every 4 (four) hours as needed  for wheezing or shortness of breath. 18 g 0   . erythromycin ophthalmic ointment Place a 1/2 inch ribbon of ointment into the lower eyelid 2-3 times per day. (Patient not taking: Reported on 04/10/2020) 3.5 g 0   . Multiple Vitamin (MULTIVITAMIN WITH MINERALS) TABS tablet Take 1 tablet by mouth daily.     . promethazine-dextromethorphan (PROMETHAZINE-DM) 6.25-15 MG/5ML syrup Take 5 mLs by mouth 4 (four) times daily as needed for cough. 100 mL 0     I have reviewed patient's Past Medical Hx, Surgical Hx, Family Hx, Social Hx, medications and allergies.   ROS:  Review of Systems  Constitutional: Negative for fever.  Gastrointestinal: Positive for abdominal pain. Negative for anorexia, constipation, diarrhea, nausea and vomiting.  Genitourinary: Negative for dysuria and frequency.   Musculoskeletal: Negative for myalgias.  Neurological: Negative for headaches.   Other systems negative  Physical Exam   Patient Vitals for the past 24 hrs:  BP Temp Pulse Resp Weight  10/03/20 2300 106/67 98.3 F (36.8 C) 87 18 85.7 kg   Constitutional: Well-developed, well-nourished female in no acute distress.  Cardiovascular: normal rate and rhythm Respiratory: normal effort, clear to auscultation bilaterally GI: Abd soft, mildly tender Epigastric and RUQ areas, abdomen gravid appropriate for gestational age.   No rebound or guarding. MS: Extremities nontender, no edema, normal ROM Neurologic: Alert and oriented x 4.  GU: Neg CVAT.  PELVIC EXAM: deferred, no pain in pelvic area  FHT:  153   Labs: Results for orders placed or performed during the hospital encounter of 10/03/20 (from the past 24 hour(s))  Pregnancy, urine POC     Status: Abnormal   Collection Time: 10/03/20 10:56 PM  Result Value Ref Range   Preg Test, Ur POSITIVE (A) NEGATIVE  CBC     Status: Abnormal   Collection Time: 10/04/20 12:06 AM  Result Value Ref Range   WBC 6.9 4.0 - 10.5 K/uL   RBC 3.77 (L) 3.87 - 5.11 MIL/uL   Hemoglobin 10.9 (L) 12.0 - 15.0 g/dL   HCT 87.5 (L) 64.3 - 32.9 %   MCV 84.1 80.0 - 100.0 fL   MCH 28.9 26.0 - 34.0 pg   MCHC 34.4 30.0 - 36.0 g/dL   RDW 51.8 84.1 - 66.0 %   Platelets 283 150 - 400 K/uL   nRBC 0.0 0.0 - 0.2 %  Comprehensive metabolic panel     Status: Abnormal   Collection Time: 10/04/20 12:06 AM  Result Value Ref Range   Sodium 134 (L) 135 - 145 mmol/L   Potassium 4.3 3.5 - 5.1 mmol/L   Chloride 102 98 - 111 mmol/L   CO2 27 22 - 32 mmol/L   Glucose, Bld 115 (H) 70 - 99 mg/dL   BUN 8 6 - 20 mg/dL   Creatinine, Ser 6.30 0.44 - 1.00 mg/dL   Calcium 9.1 8.9 - 16.0 mg/dL   Total Protein 6.1 (L) 6.5 - 8.1 g/dL   Albumin 3.2 (L) 3.5 - 5.0 g/dL   AST 12 (L) 15 - 41 U/L   ALT 12 0 - 44 U/L   Alkaline Phosphatase 28 (L) 38 - 126 U/L   Total Bilirubin 0.5 0.3 -  1.2 mg/dL   GFR, Estimated >10 >93 mL/min   Anion gap 5 5 - 15  Lipase, blood     Status: None   Collection Time: 10/04/20 12:06 AM  Result Value Ref Range   Lipase 26 11 - 51 U/L  Imaging:  No results found.  MAU Course/MDM: I have ordered labs and reviewed results.  No fever or leukocytosis.  Normal transaminases and bilirubin, so doubt obstructive cholelithiasis.  Lipase normal  Ordered simethicone while waiting for lab results  Did not feel better afterward.  States feels "a little" epigastric pain at times.  Gave GI cocktail and states it did not change pain, but that pain is "just a little".. Sitting up, appears comfortable.  Discussed normal lab results.  No lower abdominal pain so do not think this is uterine cramping.  Given epigastric/Upper abd location, suspect GI upset, GERD or some other nonobstetric source.   Has appt next week in OB office, advised to let them know about this episode and they may want to do some more followup.    Assessment: Single IUP at.[redacted]w[redacted]d Upper abdominal pain, intermittent  Plan: Discharge home Rx Pepcid provided for acid reflux  Follow up in Office for prenatal visits   Encouraged to return if she develops worsening of symptoms, increase in pain, fever, or other concerning symptoms.  Pt stable at time of discharge.  Wynelle Bourgeois CNM, MSN Certified Nurse-Midwife 10/03/2020 11:32 PM

## 2020-10-04 DIAGNOSIS — R1010 Upper abdominal pain, unspecified: Secondary | ICD-10-CM

## 2020-10-04 DIAGNOSIS — Z3A14 14 weeks gestation of pregnancy: Secondary | ICD-10-CM

## 2020-10-04 DIAGNOSIS — O26892 Other specified pregnancy related conditions, second trimester: Secondary | ICD-10-CM | POA: Diagnosis not present

## 2020-10-04 LAB — CBC
HCT: 31.7 % — ABNORMAL LOW (ref 36.0–46.0)
Hemoglobin: 10.9 g/dL — ABNORMAL LOW (ref 12.0–15.0)
MCH: 28.9 pg (ref 26.0–34.0)
MCHC: 34.4 g/dL (ref 30.0–36.0)
MCV: 84.1 fL (ref 80.0–100.0)
Platelets: 283 10*3/uL (ref 150–400)
RBC: 3.77 MIL/uL — ABNORMAL LOW (ref 3.87–5.11)
RDW: 13 % (ref 11.5–15.5)
WBC: 6.9 10*3/uL (ref 4.0–10.5)
nRBC: 0 % (ref 0.0–0.2)

## 2020-10-04 LAB — COMPREHENSIVE METABOLIC PANEL
ALT: 12 U/L (ref 0–44)
AST: 12 U/L — ABNORMAL LOW (ref 15–41)
Albumin: 3.2 g/dL — ABNORMAL LOW (ref 3.5–5.0)
Alkaline Phosphatase: 28 U/L — ABNORMAL LOW (ref 38–126)
Anion gap: 5 (ref 5–15)
BUN: 8 mg/dL (ref 6–20)
CO2: 27 mmol/L (ref 22–32)
Calcium: 9.1 mg/dL (ref 8.9–10.3)
Chloride: 102 mmol/L (ref 98–111)
Creatinine, Ser: 0.62 mg/dL (ref 0.44–1.00)
GFR, Estimated: >60 mL/min (ref >60)
Glucose, Bld: 115 mg/dL — ABNORMAL HIGH (ref 70–99)
Potassium: 4.3 mmol/L (ref 3.5–5.1)
Sodium: 134 mmol/L — ABNORMAL LOW (ref 135–145)
Total Bilirubin: 0.5 mg/dL (ref 0.3–1.2)
Total Protein: 6.1 g/dL — ABNORMAL LOW (ref 6.5–8.1)

## 2020-10-04 LAB — LIPASE, BLOOD: Lipase: 26 U/L (ref 11–51)

## 2020-10-04 MED ORDER — FAMOTIDINE 20 MG PO TABS: 20.0000 mg | ORAL_TABLET | Freq: Two times a day (BID) | ORAL | 0 refills | Status: DC

## 2020-10-04 MED ORDER — LIDOCAINE VISCOUS HCL 2 % MT SOLN
15.0000 mL | Freq: Once | OROMUCOSAL | Status: AC
  Administered 2020-10-04: 15 mL via ORAL
  Filled 2020-10-04: qty 15

## 2020-10-04 MED ORDER — ALUM & MAG HYDROXIDE-SIMETH 200-200-20 MG/5ML PO SUSP
30.0000 mL | Freq: Once | ORAL | Status: AC
  Administered 2020-10-04: 30 mL via ORAL
  Filled 2020-10-04: qty 30

## 2020-10-04 NOTE — Discharge Instructions (Signed)
Abdominal Pain During Pregnancy Belly (abdominal) pain is common during pregnancy. There are many possible causes. Some causes are more serious than others. Sometimes the cause is not known. Always tell your doctor if you have belly pain. Follow these instructions at home:  Do not have sex or put anything in your vagina until your pain goes away completely.  Get plenty of rest until your pain gets better.  Drink enough fluid to keep your pee (urine) pale yellow.  Take over-the-counter and prescription medicines only as told by your doctor.  Keep all follow-up visits.   Contact a doctor if:  You keep having pain after resting.  Your pain gets worse after resting.  You have lower belly pain that: ? Comes and goes at regular times. ? Spreads to your back. ? Feels like menstrual cramps.  You have pain or burning when you pee (urinate). Get help right away if:  You have a fever or chills.  You feel like it is hard to breathe.  You have bleeding from your vagina.  You are leaking fluid or tissue from your vagina.  You vomit for more than 24 hours.  You have watery poop (diarrhea) for more than 24 hours.  Your baby is moving less than usual.  You feel very weak or faint.  You have very bad pain in your upper belly. Summary  Belly pain is common during pregnancy. There are many possible causes.  If you have belly pain during pregnancy, tell your doctor right away.  Keep all follow-up visits. This information is not intended to replace advice given to you by your health care provider. Make sure you discuss any questions you have with your health care provider. Document Revised: 01/08/2020 Document Reviewed: 01/08/2020 [employer]vier Patient Education  2021 [employer]  

## 2020-10-14 LAB — HEPATITIS C ANTIBODY: HCV Ab: NEGATIVE

## 2020-10-14 LAB — OB RESULTS CONSOLE HEPATITIS B SURFACE ANTIGEN: Hepatitis B Surface Ag: NEGATIVE

## 2020-10-14 LAB — OB RESULTS CONSOLE RPR: RPR: NONREACTIVE

## 2020-10-14 LAB — OB RESULTS CONSOLE HIV ANTIBODY (ROUTINE TESTING): HIV: NONREACTIVE

## 2020-10-14 LAB — OB RESULTS CONSOLE RUBELLA ANTIBODY, IGM: Rubella: NON-IMMUNE/NOT IMMUNE

## 2020-10-17 LAB — OB RESULTS CONSOLE GC/CHLAMYDIA
Chlamydia: NEGATIVE
Gonorrhea: NEGATIVE

## 2020-11-02 LAB — OB RESULTS CONSOLE GBS: GBS: NEGATIVE

## 2021-01-28 ENCOUNTER — Inpatient Hospital Stay (HOSPITAL_COMMUNITY)
Admission: AD | Admit: 2021-01-28 | Discharge: 2021-01-28 | Disposition: A | Discharge status: Home / Self Care | Payer: Medicaid Other | Attending: Obstetrics & Gynecology | Admitting: Obstetrics & Gynecology

## 2021-01-28 ENCOUNTER — Other Ambulatory Visit: Payer: Self-pay

## 2021-01-28 ENCOUNTER — Encounter (HOSPITAL_COMMUNITY): Payer: Self-pay | Admitting: Obstetrics & Gynecology

## 2021-01-28 DIAGNOSIS — Z883 Allergy status to other anti-infective agents status: Secondary | ICD-10-CM | POA: Diagnosis not present

## 2021-01-28 DIAGNOSIS — Z79899 Other long term (current) drug therapy: Secondary | ICD-10-CM | POA: Insufficient documentation

## 2021-01-28 DIAGNOSIS — Z886 Allergy status to analgesic agent status: Secondary | ICD-10-CM | POA: Insufficient documentation

## 2021-01-28 DIAGNOSIS — Z881 Allergy status to other antibiotic agents status: Secondary | ICD-10-CM | POA: Diagnosis not present

## 2021-01-28 DIAGNOSIS — O479 False labor, unspecified: Secondary | ICD-10-CM

## 2021-01-28 DIAGNOSIS — Z3A31 31 weeks gestation of pregnancy: Secondary | ICD-10-CM | POA: Diagnosis not present

## 2021-01-28 DIAGNOSIS — O26893 Other specified pregnancy related conditions, third trimester: Secondary | ICD-10-CM | POA: Insufficient documentation

## 2021-01-28 DIAGNOSIS — O4693 Antepartum hemorrhage, unspecified, third trimester: Secondary | ICD-10-CM | POA: Diagnosis not present

## 2021-01-28 DIAGNOSIS — N898 Other specified noninflammatory disorders of vagina: Secondary | ICD-10-CM | POA: Diagnosis not present

## 2021-01-28 DIAGNOSIS — O4703 False labor before 37 completed weeks of gestation, third trimester: Secondary | ICD-10-CM | POA: Diagnosis not present

## 2021-01-28 LAB — URINALYSIS, ROUTINE W REFLEX MICROSCOPIC
Bilirubin Urine: NEGATIVE
Glucose, UA: NEGATIVE mg/dL
Ketones, ur: NEGATIVE mg/dL
Nitrite: NEGATIVE
Protein, ur: NEGATIVE mg/dL
Specific Gravity, Urine: 1.01 (ref 1.005–1.030)
pH: 7 (ref 5.0–8.0)

## 2021-01-28 LAB — CBC
HCT: 29.4 % — ABNORMAL LOW (ref 36.0–46.0)
Hemoglobin: 10.2 g/dL — ABNORMAL LOW (ref 12.0–15.0)
MCH: 29.5 pg (ref 26.0–34.0)
MCHC: 34.7 g/dL (ref 30.0–36.0)
MCV: 85 fL (ref 80.0–100.0)
Platelets: 273 10*3/uL (ref 150–400)
RBC: 3.46 MIL/uL — ABNORMAL LOW (ref 3.87–5.11)
RDW: 13.7 % (ref 11.5–15.5)
WBC: 5.4 10*3/uL (ref 4.0–10.5)
nRBC: 0 % (ref 0.0–0.2)

## 2021-01-28 LAB — ABO/RH: ABO/RH(D): B POS

## 2021-01-28 LAB — URINALYSIS, MICROSCOPIC (REFLEX)

## 2021-01-28 LAB — WET PREP, GENITAL
Sperm: NONE SEEN
Trich, Wet Prep: NONE SEEN
Yeast Wet Prep HPF POC: NONE SEEN

## 2021-01-28 MED ORDER — NIFEDIPINE 10 MG PO CAPS
10.0000 mg | ORAL_CAPSULE | ORAL | Status: DC | PRN
  Administered 2021-01-28: 10 mg via ORAL
  Filled 2021-01-28 (×2): qty 1

## 2021-01-28 NOTE — MAU Provider Note (Signed)
Chief Complaint:  Vaginal Bleeding and Abdominal Pain   Event Date/Time   First Provider Initiated Contact with Patient 01/28/21 [phone]     HPI: April Crane is a 30 y.o. G1P0 at 76w3dwho presents to maternity admissions reporting lower abdominal pressure (now resolved) and bleeding.  Was on tissue but has not continued to bleed. She reports good fetal movement, denies LOF, vaginal itching/burning, urinary symptoms, h/a, dizziness, n/v, diarrhea, constipation or fever/chills.    Vaginal Bleeding The patient's primary symptoms include vaginal bleeding. The patient's pertinent negatives include no genital itching, genital lesions or genital odor. This is a new problem. The current episode started today. The patient is experiencing no pain. She is pregnant. Pertinent negatives include no abdominal pain, back pain, chills, constipation, diarrhea, fever or nausea. The vaginal discharge was bloody. The vaginal bleeding is lighter than menses. She has not been passing clots. She has not been passing tissue. Nothing aggravates the symptoms. She has tried nothing for the symptoms. She is sexually active.   RN Note: PT SAYS AT 0530- WOKE WITH LOWER ABD PRESSURE- B-ROOM WHEN SHE WIPED- TISSUE COVERED WITH BLOOD HAD SEX Sunday NIGHT  NO PAD ON OR UNDERWEAR ON - SAYS NO BLOOD ON THIGHS SAYS NO CRAMPING OR PRESSURE NOW   PNC WITH [facility]  Past Medical History: Past Medical History:  Diagnosis Date   No pertinent past medical history    Obstruction of colon (HCC)    Renal disorder     Past obstetric history: OB History  Gravida Para Term Preterm AB Living  1 0          SAB IAB Ectopic Multiple Live Births               # Outcome Date GA Lbr Len/2nd Weight Sex Delivery Anes PTL Lv  1 Current             Past Surgical History: Past Surgical History:  Procedure Laterality Date   ANTERIOR CRUCIATE LIGAMENT REPAIR     ANTERIOR CRUCIATE LIGAMENT REPAIR      Family History: Family History   Problem Relation Age of Onset   Asthma Sister    Cancer Maternal Grandmother    Allergic rhinitis Neg Hx    Angioedema Neg Hx    Atopy Neg Hx    Eczema Neg Hx    Immunodeficiency Neg Hx    Urticaria Neg Hx     Social History: Social History   Tobacco Use   Smoking status: Never   Smokeless tobacco: Never  Vaping Use   Vaping Use: Never used  Substance Use Topics   Alcohol use: No   Drug use: No    Allergies:  Allergies  Allergen Reactions   Flagyl [Metronidazole] Anaphylaxis   Tetracyclines & Related Anaphylaxis   Diflucan [Fluconazole] Swelling   Ibuprofen Other (See Comments)    Damaged kidneys   Other Nausea And Vomiting    Antibiotic (patient cannot recall name/elementary school)    Meds:  Medications Prior to Admission  Medication Sig Dispense Refill Last Dose   ferrous sulfate 325 (65 FE) MG tablet Take 325 mg by mouth daily with breakfast.   01/27/2021   Prenatal Vit-Fe Fumarate-FA (PRENATAL MULTIVITAMIN) TABS tablet Take 1 tablet by mouth daily at 12 noon.   01/27/2021   triamcinolone lotion (KENALOG) 0.1 % Apply 1 application topically 3 (three) times daily.   01/27/2021   valACYclovir (VALTREX) 500 MG tablet Take 500 mg by mouth 2 (two) times  daily.   01/26/2021   acetaminophen (TYLENOL) 325 MG tablet Take 650 mg by mouth every 6 (six) hours as needed for mild pain or headache.   More than a month   albuterol (VENTOLIN HFA) 108 (90 Base) MCG/ACT inhaler Inhale 2 puffs into the lungs every 4 (four) hours as needed for wheezing or shortness of breath. 18 g 0 More than a month   erythromycin ophthalmic ointment Place a 1/2 inch ribbon of ointment into the lower eyelid 2-3 times per day. (Patient not taking: Reported on 04/10/2020) 3.5 g 0    famotidine (PEPCID) 20 MG tablet Take 1 tablet (20 mg total) by mouth 2 (two) times daily. 30 tablet 0 More than a month   Multiple Vitamin (MULTIVITAMIN WITH MINERALS) TABS tablet Take 1 tablet by mouth daily.       promethazine-dextromethorphan (PROMETHAZINE-DM) 6.25-15 MG/5ML syrup Take 5 mLs by mouth 4 (four) times daily as needed for cough. 100 mL 0 More than a month    I have reviewed patient's Past Medical Hx, Surgical Hx, Family Hx, Social Hx, medications and allergies.   ROS:  Review of Systems  Constitutional:  Negative for chills and fever.  Respiratory:  Negative for shortness of breath.   Gastrointestinal:  Negative for abdominal pain, constipation, diarrhea and nausea.  Genitourinary:  Positive for vaginal bleeding.  Musculoskeletal:  Negative for back pain.  Other systems negative  Physical Exam  Patient Vitals for the past 24 hrs:  BP Temp Temp src Pulse Resp Height Weight  01/28/21 0645 101/61 -- -- 88 -- -- --  01/28/21 0624 96/60 98.3 F (36.8 C) Oral 96 20 5' (1.524 m) 87.1 kg   Constitutional: Well-developed, well-nourished female in no acute distress.  Cardiovascular: normal rate Respiratory: normal effort GI: Abd soft, non-tender, gravid appropriate for gestational age.   No rebound or guarding. MS: Extremities nontender, no edema, normal ROM Neurologic: Alert and oriented x 4.  GU: Neg CVAT.  PELVIC EXAM: Cervix pink, visually closed, without lesion, scant white creamy discharge, vaginal walls and external genitalia normal except for a dark red, vascular tag of tissue at the introitus.  Tissue is attached by a small stalk/pedicle on outer vagina at introitus.   It is friable.  No blood in vault, suspect this is what was bleeding   FHT:  Baseline 130 , moderate variability, accelerations present, no decelerations Contractions: q 4-5 mins Irregular    Labs: Results for orders placed or performed during the hospital encounter of 01/28/21 (from the past 24 hour(s))  Urinalysis, Routine w reflex microscopic Urine, Clean Catch     Status: Abnormal   Collection Time: 01/28/21  6:40 AM  Result Value Ref Range   Color, Urine YELLOW YELLOW   APPearance CLOUDY (A) CLEAR    Specific Gravity, Urine 1.010 1.005 - 1.030   pH 7.0 5.0 - 8.0   Glucose, UA NEGATIVE NEGATIVE mg/dL   Hgb urine dipstick LARGE (A) NEGATIVE   Bilirubin Urine NEGATIVE NEGATIVE   Ketones, ur NEGATIVE NEGATIVE mg/dL   Protein, ur NEGATIVE NEGATIVE mg/dL   Nitrite NEGATIVE NEGATIVE   Leukocytes,Ua MODERATE (A) NEGATIVE  Urinalysis, Microscopic (reflex)     Status: Abnormal   Collection Time: 01/28/21  6:40 AM  Result Value Ref Range   RBC / HPF 0-5 0 - 5 RBC/hpf   WBC, UA 6-10 0 - 5 WBC/hpf   Bacteria, UA RARE (A) NONE SEEN   Squamous Epithelial / LPF 11-20 0 - 5  Amorphous Crystal PRESENT   Wet prep, genital     Status: Abnormal   Collection Time: 01/28/21  6:50 AM  Result Value Ref Range   Yeast Wet Prep HPF POC NONE SEEN NONE SEEN   Trich, Wet Prep NONE SEEN NONE SEEN   Clue Cells Wet Prep HPF POC PRESENT (A) NONE SEEN   WBC, Wet Prep HPF POC MANY (A) NONE SEEN   Sperm NONE SEEN   CBC     Status: Abnormal   Collection Time: 01/28/21  7:05 AM  Result Value Ref Range   WBC 5.4 4.0 - 10.5 K/uL   RBC 3.46 (L) 3.87 - 5.11 MIL/uL   Hemoglobin 10.2 (L) 12.0 - 15.0 g/dL   HCT 17.5 (L) 10.2 - 58.5 %   MCV 85.0 80.0 - 100.0 fL   MCH 29.5 26.0 - 34.0 pg   MCHC 34.7 30.0 - 36.0 g/dL   RDW 27.7 82.4 - 23.5 %   Platelets 273 150 - 400 K/uL   nRBC 0.0 0.0 - 0.2 %  ABO/Rh     Status: None   Collection Time: 01/28/21  7:05 AM  Result Value Ref Range   ABO/RH(D) B POS    No rh immune globuloin      NOT A RH IMMUNE GLOBULIN CANDIDATE, PT RH POSITIVE Performed at [facility], [address] [place], [place] [address]      Imaging:  No results found.  MAU Course/MDM: I have ordered UA, CBC, ABO/Rh and wet prep NST reviewed, reassuring with contractions noted.  Patient only feels them as tightening.  Treatments in MAU included Procardia series Patient's BP went down to 98/64 after one dose of Procardia Consulted Dr Para March, who recommends holding off on any more Procardia,  especially since cervix is long/closed and patient does not feel contractions  Consulted Dr Para March re: bleeding tissue on vagina. She recommends leaving it for the [facility] providers to assess and possibly remove. Will probably need to be sent to pathology.    Message left with Dr Mora Appl for follow-up in office  Assessment: Single IUP at [redacted]w[redacted]d Vaginal bleeding, likely from vascular skin tag on vagina Uterine contractions, painless  Plan: Discharge home Preterm labor precautions Bleeding precautions Followup in office later this week for evaluation and reassessment Encouraged to return if she develops worsening of symptoms, increase in pain, fever, or other concerning symptoms.    Wynelle Bourgeois CNM, MSN Certified Nurse-Midwife 01/28/2021 6:46 AM

## 2021-01-28 NOTE — MAU Note (Signed)
PT SAYS AT 0530- WOKE WITH LOWER ABD PRESSURE- B-ROOM WHEN SHE WIPED- TISSUE COVERED WITH BLOOD HAD SEX Sunday NIGHT  NO PAD ON OR UNDERWEAR ON - SAYS NO BLOOD ON THIGHS SAYS NO CRAMPING OR PRESSURE NOW  PNC WITH [facility]

## 2021-03-03 ENCOUNTER — Inpatient Hospital Stay (HOSPITAL_COMMUNITY)
Admission: AD | Admit: 2021-03-03 | Discharge: 2021-03-03 | Disposition: A | Discharge status: Home / Self Care | Payer: Medicaid Other | Attending: Obstetrics and Gynecology | Admitting: Obstetrics and Gynecology

## 2021-03-03 ENCOUNTER — Other Ambulatory Visit: Payer: Self-pay

## 2021-03-03 ENCOUNTER — Encounter (HOSPITAL_COMMUNITY): Payer: Self-pay | Admitting: Obstetrics and Gynecology

## 2021-03-03 DIAGNOSIS — Z3A00 Weeks of gestation of pregnancy not specified: Secondary | ICD-10-CM

## 2021-03-03 DIAGNOSIS — O471 False labor at or after 37 completed weeks of gestation: Secondary | ICD-10-CM | POA: Diagnosis present

## 2021-03-03 DIAGNOSIS — O479 False labor, unspecified: Secondary | ICD-10-CM

## 2021-03-03 DIAGNOSIS — Z3689 Encounter for other specified antenatal screening: Secondary | ICD-10-CM | POA: Diagnosis not present

## 2021-03-03 DIAGNOSIS — Z3A37 37 weeks gestation of pregnancy: Secondary | ICD-10-CM | POA: Insufficient documentation

## 2021-03-03 NOTE — MAU Note (Signed)
Patient presents to MAU with lower ABD pain and pressure that start about an hour ago with a pain rating of 10/10. Patient VB, LOF, DFM, and no intercourse in the last 24 hours. Last SVE in the office was closed.

## 2021-03-19 ENCOUNTER — Other Ambulatory Visit: Payer: Self-pay | Admitting: Obstetrics and Gynecology

## 2021-03-25 ENCOUNTER — Encounter (HOSPITAL_COMMUNITY): Payer: Self-pay | Admitting: Obstetrics and Gynecology

## 2021-03-25 ENCOUNTER — Inpatient Hospital Stay (HOSPITAL_COMMUNITY)
Admission: AD | Admit: 2021-03-25 | Discharge: 2021-03-29 | DRG: 787 | Disposition: A | Discharge status: Home / Self Care | Payer: Medicaid Other | Attending: Obstetrics & Gynecology | Admitting: Obstetrics & Gynecology

## 2021-03-25 DIAGNOSIS — Z20822 Contact with and (suspected) exposure to covid-19: Secondary | ICD-10-CM | POA: Diagnosis present

## 2021-03-25 DIAGNOSIS — A6000 Herpesviral infection of urogenital system, unspecified: Secondary | ICD-10-CM | POA: Diagnosis present

## 2021-03-25 DIAGNOSIS — Z3A39 39 weeks gestation of pregnancy: Secondary | ICD-10-CM

## 2021-03-25 DIAGNOSIS — O9902 Anemia complicating childbirth: Secondary | ICD-10-CM | POA: Diagnosis present

## 2021-03-25 DIAGNOSIS — O9832 Other infections with a predominantly sexual mode of transmission complicating childbirth: Secondary | ICD-10-CM | POA: Diagnosis present

## 2021-03-25 DIAGNOSIS — O99214 Obesity complicating childbirth: Secondary | ICD-10-CM | POA: Diagnosis present

## 2021-03-25 DIAGNOSIS — O480 Post-term pregnancy: Principal | ICD-10-CM | POA: Diagnosis present

## 2021-03-25 HISTORY — DX: Unspecified asthma, uncomplicated: J45.909

## 2021-03-25 HISTORY — DX: Headache, unspecified: R51.9

## 2021-03-25 NOTE — MAU Note (Signed)
..  April Crane is a 30 y.o. at [redacted]w[redacted]d here in MAU reporting: LOF, clear since 2100, pt felt small pop as she was sitting on the bed, and her bed was wet. Pt reports constant small leak every time she going to the bathroom. Pt denies VB and abnormal discharge. Pt reports ctx for the past couple days, but got slightly increased after gush of fluid. Pt denies DFM and PIH s/s. Last intercourse has been months.  Last sve 3 weeks ago: 0 closed  Onset of complaint: 2100 Pain score: 6/10 CTX There were no vitals filed for this visit.   FHT:120  Lab orders placed from triage: none

## 2021-03-26 ENCOUNTER — Encounter (HOSPITAL_COMMUNITY): Payer: Self-pay | Admitting: Obstetrics and Gynecology

## 2021-03-26 ENCOUNTER — Other Ambulatory Visit: Payer: Self-pay

## 2021-03-26 ENCOUNTER — Encounter (HOSPITAL_COMMUNITY)
Admission: AD | Disposition: A | Discharge status: Home / Self Care | Payer: Self-pay | Source: Home / Self Care | Attending: Obstetrics & Gynecology

## 2021-03-26 ENCOUNTER — Inpatient Hospital Stay (HOSPITAL_COMMUNITY): Payer: Medicaid Other | Admitting: Anesthesiology

## 2021-03-26 DIAGNOSIS — O26893 Other specified pregnancy related conditions, third trimester: Secondary | ICD-10-CM | POA: Diagnosis present

## 2021-03-26 DIAGNOSIS — Z3A39 39 weeks gestation of pregnancy: Secondary | ICD-10-CM

## 2021-03-26 DIAGNOSIS — O9902 Anemia complicating childbirth: Secondary | ICD-10-CM | POA: Diagnosis present

## 2021-03-26 DIAGNOSIS — O99214 Obesity complicating childbirth: Secondary | ICD-10-CM | POA: Diagnosis present

## 2021-03-26 DIAGNOSIS — O480 Post-term pregnancy: Secondary | ICD-10-CM | POA: Diagnosis present

## 2021-03-26 DIAGNOSIS — O4202 Full-term premature rupture of membranes, onset of labor within 24 hours of rupture: Secondary | ICD-10-CM | POA: Diagnosis not present

## 2021-03-26 DIAGNOSIS — A6000 Herpesviral infection of urogenital system, unspecified: Secondary | ICD-10-CM | POA: Diagnosis present

## 2021-03-26 DIAGNOSIS — O9832 Other infections with a predominantly sexual mode of transmission complicating childbirth: Secondary | ICD-10-CM | POA: Diagnosis present

## 2021-03-26 DIAGNOSIS — Z20822 Contact with and (suspected) exposure to covid-19: Secondary | ICD-10-CM | POA: Diagnosis present

## 2021-03-26 LAB — RESP PANEL BY RT-PCR (FLU A&B, COVID) ARPGX2
Influenza A by PCR: NEGATIVE
Influenza B by PCR: NEGATIVE
SARS Coronavirus 2 by RT PCR: NEGATIVE

## 2021-03-26 LAB — CBC
HCT: 36.1 % (ref 36.0–46.0)
Hemoglobin: 12 g/dL (ref 12.0–15.0)
MCH: 29.3 pg (ref 26.0–34.0)
MCHC: 33.2 g/dL (ref 30.0–36.0)
MCV: 88.3 fL (ref 80.0–100.0)
Platelets: 275 10*3/uL (ref 150–400)
RBC: 4.09 MIL/uL (ref 3.87–5.11)
RDW: 14.4 % (ref 11.5–15.5)
WBC: 5.8 10*3/uL (ref 4.0–10.5)
nRBC: 0 % (ref 0.0–0.2)

## 2021-03-26 LAB — TYPE AND SCREEN
ABO/RH(D): B POS
Antibody Screen: NEGATIVE

## 2021-03-26 LAB — AMNISURE RUPTURE OF MEMBRANE (ROM) NOT AT ARMC: Amnisure ROM: POSITIVE

## 2021-03-26 LAB — POCT FERN TEST: POCT Fern Test: POSITIVE

## 2021-03-26 LAB — RPR: RPR Ser Ql: NONREACTIVE

## 2021-03-26 SURGERY — Surgical Case
Anesthesia: Epidural | Wound class: Clean Contaminated

## 2021-03-26 MED ORDER — EPHEDRINE 5 MG/ML INJ
10.0000 mg | INTRAVENOUS | Status: DC | PRN
  Administered 2021-03-26: 21:00:00 10 mg via INTRAVENOUS
  Filled 2021-03-26: qty 5

## 2021-03-26 MED ORDER — FENTANYL CITRATE (PF) 100 MCG/2ML IJ SOLN
INTRAMUSCULAR | Status: DC | PRN
  Administered 2021-03-26: 100 ug via EPIDURAL

## 2021-03-26 MED ORDER — OXYCODONE-ACETAMINOPHEN 5-325 MG PO TABS: 1.0000 | ORAL_TABLET | ORAL | Status: DC | PRN

## 2021-03-26 MED ORDER — LACTATED RINGERS IV SOLN
INTRAVENOUS | Status: DC
  Administered 2021-03-26: 03:00:00 1000 mL via INTRAVENOUS

## 2021-03-26 MED ORDER — TERBUTALINE SULFATE 1 MG/ML IJ SOLN: 0.2500 mg | Freq: Once | INTRAMUSCULAR | Status: DC | PRN

## 2021-03-26 MED ORDER — MORPHINE SULFATE (PF) 0.5 MG/ML IJ SOLN
INTRAMUSCULAR | Status: DC | PRN
  Administered 2021-03-26: 3 mg via EPIDURAL

## 2021-03-26 MED ORDER — EPHEDRINE SULFATE-NACL 50-0.9 MG/10ML-% IV SOSY
PREFILLED_SYRINGE | INTRAVENOUS | Status: DC | PRN
  Administered 2021-03-26: 5 mg via INTRAVENOUS

## 2021-03-26 MED ORDER — PHENYLEPHRINE 40 MCG/ML (10ML) SYRINGE FOR IV PUSH (FOR BLOOD PRESSURE SUPPORT)
PREFILLED_SYRINGE | INTRAVENOUS | Status: AC
  Filled 2021-03-26: qty 10

## 2021-03-26 MED ORDER — CEFAZOLIN SODIUM-DEXTROSE 2-3 GM-%(50ML) IV SOLR
INTRAVENOUS | Status: DC | PRN
  Administered 2021-03-26: 2 g via INTRAVENOUS

## 2021-03-26 MED ORDER — ACETAMINOPHEN 325 MG PO TABS: 650.0000 mg | ORAL_TABLET | ORAL | Status: DC | PRN

## 2021-03-26 MED ORDER — ONDANSETRON HCL 4 MG/2ML IJ SOLN
INTRAMUSCULAR | Status: DC | PRN
  Administered 2021-03-26: 4 mg via INTRAVENOUS

## 2021-03-26 MED ORDER — OXYTOCIN-SODIUM CHLORIDE 30-0.9 UT/500ML-% IV SOLN
1.0000 m[IU]/min | INTRAVENOUS | Status: DC
Start: 2021-03-26 — End: 2021-03-27
  Administered 2021-03-26: 06:00:00 2 m[IU]/min via INTRAVENOUS
  Filled 2021-03-26: qty 500

## 2021-03-26 MED ORDER — ONDANSETRON HCL 4 MG/2ML IJ SOLN: 4.0000 mg | Freq: Four times a day (QID) | INTRAMUSCULAR | Status: DC | PRN

## 2021-03-26 MED ORDER — LIDOCAINE-EPINEPHRINE (PF) 2 %-1:200000 IJ SOLN
INTRAMUSCULAR | Status: DC | PRN
  Administered 2021-03-26 (×2): 5 mL via EPIDURAL

## 2021-03-26 MED ORDER — DEXAMETHASONE SODIUM PHOSPHATE 10 MG/ML IJ SOLN
INTRAMUSCULAR | Status: AC
  Filled 2021-03-26: qty 1

## 2021-03-26 MED ORDER — LIDOCAINE HCL (PF) 1 % IJ SOLN: 30.0000 mL | INTRAMUSCULAR | Status: DC | PRN

## 2021-03-26 MED ORDER — STERILE WATER FOR IRRIGATION IR SOLN
Status: DC | PRN
  Administered 2021-03-26: 1

## 2021-03-26 MED ORDER — LACTATED RINGERS IV SOLN
500.0000 mL | INTRAVENOUS | Status: DC | PRN
  Administered 2021-03-26: 04:00:00 500 mL via INTRAVENOUS

## 2021-03-26 MED ORDER — DEXAMETHASONE SODIUM PHOSPHATE 10 MG/ML IJ SOLN
INTRAMUSCULAR | Status: DC | PRN
  Administered 2021-03-26: 10 mg via INTRAVENOUS

## 2021-03-26 MED ORDER — SOD CITRATE-CITRIC ACID 500-334 MG/5ML PO SOLN
30.0000 mL | ORAL | Status: DC | PRN
  Filled 2021-03-26: qty 30

## 2021-03-26 MED ORDER — PHENYLEPHRINE 40 MCG/ML (10ML) SYRINGE FOR IV PUSH (FOR BLOOD PRESSURE SUPPORT)
80.0000 ug | PREFILLED_SYRINGE | INTRAVENOUS | Status: DC | PRN
  Administered 2021-03-26: 21:00:00 80 ug via INTRAVENOUS
  Filled 2021-03-26: qty 10

## 2021-03-26 MED ORDER — MORPHINE SULFATE (PF) 0.5 MG/ML IJ SOLN
INTRAMUSCULAR | Status: AC
  Filled 2021-03-26: qty 10

## 2021-03-26 MED ORDER — OXYCODONE-ACETAMINOPHEN 5-325 MG PO TABS: 2.0000 | ORAL_TABLET | ORAL | Status: DC | PRN

## 2021-03-26 MED ORDER — LACTATED RINGERS IV SOLN
500.0000 mL | Freq: Once | INTRAVENOUS | Status: AC
  Administered 2021-03-26: 21:00:00 500 mL via INTRAVENOUS

## 2021-03-26 MED ORDER — MISOPROSTOL 25 MCG QUARTER TABLET: 25.0000 ug | ORAL_TABLET | ORAL | Status: DC | PRN

## 2021-03-26 MED ORDER — LACTATED RINGERS IV SOLN: 500.0000 mL | INTRAVENOUS | Status: DC | PRN

## 2021-03-26 MED ORDER — DIPHENHYDRAMINE HCL 50 MG/ML IJ SOLN: 12.5000 mg | INTRAMUSCULAR | Status: DC | PRN

## 2021-03-26 MED ORDER — SODIUM CHLORIDE 0.9 % IR SOLN
Status: DC | PRN
  Administered 2021-03-26: 1

## 2021-03-26 MED ORDER — OXYTOCIN BOLUS FROM INFUSION: 333.0000 mL | Freq: Once | INTRAVENOUS | Status: DC

## 2021-03-26 MED ORDER — FENTANYL-BUPIVACAINE-NACL 0.5-0.125-0.9 MG/250ML-% EP SOLN
12.0000 mL/h | EPIDURAL | Status: DC | PRN
  Administered 2021-03-26: 21:00:00 12 mL/h via EPIDURAL
  Filled 2021-03-26: qty 250

## 2021-03-26 MED ORDER — OXYTOCIN-SODIUM CHLORIDE 30-0.9 UT/500ML-% IV SOLN
2.5000 [IU]/h | INTRAVENOUS | Status: DC
  Administered 2021-03-26: 23:00:00 3 [IU]/h via INTRAVENOUS
  Filled 2021-03-26: qty 500

## 2021-03-26 MED ORDER — LIDOCAINE HCL (PF) 1 % IJ SOLN
INTRAMUSCULAR | Status: DC | PRN
  Administered 2021-03-26 (×2): 4 mL via EPIDURAL

## 2021-03-26 MED ORDER — PHENYLEPHRINE 40 MCG/ML (10ML) SYRINGE FOR IV PUSH (FOR BLOOD PRESSURE SUPPORT)
PREFILLED_SYRINGE | INTRAVENOUS | Status: DC | PRN
  Administered 2021-03-26 (×2): 80 ug via INTRAVENOUS
  Administered 2021-03-26: 40 ug via INTRAVENOUS

## 2021-03-26 MED ORDER — EPHEDRINE 5 MG/ML INJ: 10.0000 mg | INTRAVENOUS | Status: DC | PRN

## 2021-03-26 MED ORDER — PHENYLEPHRINE 40 MCG/ML (10ML) SYRINGE FOR IV PUSH (FOR BLOOD PRESSURE SUPPORT)
80.0000 ug | PREFILLED_SYRINGE | INTRAVENOUS | Status: DC | PRN
  Administered 2021-03-26 (×2): 80 ug via INTRAVENOUS

## 2021-03-26 MED ORDER — FENTANYL CITRATE (PF) 100 MCG/2ML IJ SOLN
50.0000 ug | INTRAMUSCULAR | Status: DC | PRN
  Administered 2021-03-26: 19:00:00 100 ug via INTRAVENOUS
  Filled 2021-03-26 (×2): qty 2

## 2021-03-26 MED ORDER — OXYTOCIN-SODIUM CHLORIDE 30-0.9 UT/500ML-% IV SOLN: 2.5000 [IU]/h | INTRAVENOUS | Status: DC

## 2021-03-26 MED ORDER — LACTATED RINGERS IV SOLN
500.0000 mL | Freq: Once | INTRAVENOUS | Status: AC
  Administered 2021-03-26: 22:00:00 500 mL via INTRAVENOUS

## 2021-03-26 MED ORDER — SOD CITRATE-CITRIC ACID 500-334 MG/5ML PO SOLN: 30.0000 mL | ORAL | Status: DC | PRN

## 2021-03-26 MED ORDER — LACTATED RINGERS IV SOLN: INTRAVENOUS | Status: DC

## 2021-03-26 SURGICAL SUPPLY — 47 items
ADH SKN CLS APL DERMABOND .7 (GAUZE/BANDAGES/DRESSINGS)
ADH SKN CLS LQ APL DERMABOND (GAUZE/BANDAGES/DRESSINGS) ×1
APL SKNCLS STERI-STRIP NONHPOA (GAUZE/BANDAGES/DRESSINGS) ×1
BENZOIN TINCTURE PRP APPL 2/3 (GAUZE/BANDAGES/DRESSINGS) ×1 IMPLANT
CHLORAPREP W/TINT 26ML (MISCELLANEOUS) ×2 IMPLANT
CLAMP CORD UMBIL (MISCELLANEOUS) IMPLANT
CLOSURE STERI STRIP 1/2 X4 (GAUZE/BANDAGES/DRESSINGS) ×1 IMPLANT
CLOTH BEACON ORANGE TIMEOUT ST (SAFETY) ×2 IMPLANT
DERMABOND ADHESIVE PROPEN (GAUZE/BANDAGES/DRESSINGS) ×1
DERMABOND ADVANCED (GAUZE/BANDAGES/DRESSINGS)
DERMABOND ADVANCED .7 DNX12 (GAUZE/BANDAGES/DRESSINGS) IMPLANT
DERMABOND ADVANCED .7 DNX6 (GAUZE/BANDAGES/DRESSINGS) IMPLANT
DRAPE C SECTION CLR SCREEN (DRAPES) ×2 IMPLANT
DRSG OPSITE POSTOP 4X10 (GAUZE/BANDAGES/DRESSINGS) ×2 IMPLANT
ELECT REM PT RETURN 9FT ADLT (ELECTROSURGICAL) ×2
ELECTRODE REM PT RTRN 9FT ADLT (ELECTROSURGICAL) ×1 IMPLANT
EXTRACTOR VACUUM M CUP 4 TUBE (SUCTIONS) IMPLANT
GAUZE SPONGE 4X4 8PLY STRL (GAUZE/BANDAGES/DRESSINGS) ×1 IMPLANT
GLOVE BIOGEL PI IND STRL 7.0 (GLOVE) ×2 IMPLANT
GLOVE BIOGEL PI INDICATOR 7.0 (GLOVE) ×2
GLOVE SURG SS PI 6.5 STRL IVOR (GLOVE) ×2 IMPLANT
GOWN STRL REUS W/TWL LRG LVL3 (GOWN DISPOSABLE) ×4 IMPLANT
KIT ABG SYR 3ML LUER SLIP (SYRINGE) IMPLANT
NDL HYPO 25X5/8 SAFETYGLIDE (NEEDLE) IMPLANT
NEEDLE HYPO 25X5/8 SAFETYGLIDE (NEEDLE) IMPLANT
NS IRRIG 1000ML POUR BTL (IV SOLUTION) ×2 IMPLANT
PACK C SECTION WH (CUSTOM PROCEDURE TRAY) ×2 IMPLANT
PAD ABD 8X10 STRL (GAUZE/BANDAGES/DRESSINGS) ×1 IMPLANT
PAD OB MATERNITY 4.3X12.25 (PERSONAL CARE ITEMS) ×2 IMPLANT
PENCIL SMOKE EVAC W/HOLSTER (ELECTROSURGICAL) ×2 IMPLANT
RTRCTR C-SECT PINK 25CM LRG (MISCELLANEOUS) ×2 IMPLANT
SUT CHROMIC 1 CTX 36 (SUTURE) IMPLANT
SUT CHROMIC 2 0 CT 1 (SUTURE) ×2 IMPLANT
SUT MON AB 4-0 PS1 27 (SUTURE) ×2 IMPLANT
SUT PLAIN 1 NONE 54 (SUTURE) IMPLANT
SUT PLAIN 2 0 (SUTURE)
SUT PLAIN 2 0 XLH (SUTURE) ×1 IMPLANT
SUT PLAIN ABS 2-0 CT1 27XMFL (SUTURE) IMPLANT
SUT VIC AB 0 CTX 36 (SUTURE) ×2
SUT VIC AB 0 CTX36XBRD ANBCTRL (SUTURE) ×1 IMPLANT
SUT VIC AB 1 CTX 36 (SUTURE) ×4
SUT VIC AB 1 CTX36XBRD ANBCTRL (SUTURE) ×2 IMPLANT
SUT VIC AB 4-0 KS 27 (SUTURE) ×1 IMPLANT
TAPE PAPER 3X10 WHT MICROPORE (GAUZE/BANDAGES/DRESSINGS) ×1 IMPLANT
TOWEL OR 17X24 6PK STRL BLUE (TOWEL DISPOSABLE) ×2 IMPLANT
TRAY FOLEY W/BAG SLVR 14FR LF (SET/KITS/TRAYS/PACK) ×2 IMPLANT
WATER STERILE IRR 1000ML POUR (IV SOLUTION) ×2 IMPLANT

## 2021-03-26 NOTE — MAU Provider Note (Signed)
Chief Complaint:  Contractions and Rupture of Membranes   Event Date/Time   First Provider Initiated Contact with Patient 03/26/21 0025     HPI: April Crane is a 30 y.o. G1P0 at 69w4dwho presents to maternity admissions reporting leaking of fluid.  Not feeling contractions much.  . She reports good fetal movement, denies vaginal bleeding, vaginal itching/burning, urinary symptoms, h/a, dizziness, n/v, diarrhea, constipation or fever/chills..  Vaginal Discharge The patient's primary symptoms include vaginal discharge. The patient's pertinent negatives include no genital itching, genital lesions, genital odor, pelvic pain or vaginal bleeding. This is a new problem. The current episode started today. The problem occurs intermittently. The patient is experiencing no pain. She is pregnant. Pertinent negatives include no chills or fever. The vaginal discharge was clear and watery. There has been no bleeding. She has not been passing clots. She has not been passing tissue. Nothing aggravates the symptoms. She has tried nothing for the symptoms.   RN Note: April Crane is a 30 y.o. at [redacted]w[redacted]d here in MAU reporting: LOF, clear since 2100, pt felt small pop as she was sitting on the bed, and her bed was wet. Pt reports constant small leak every time she going to the bathroom. Pt denies VB and abnormal discharge. Pt reports ctx for the past couple days, but got slightly increased after gush of fluid. Pt denies DFM and PIH s/s. Last intercourse has been months.  Last sve 3 weeks ago: 0 closed  Onset of complaint: 2100 Pain score: 6/10 CTX There were no vitals filed for this visit.   FHT:120  Past Medical History: Past Medical History:  Diagnosis Date   Asthma    Headache    No pertinent past medical history    Obstruction of colon (HCC)    Renal disorder     Past obstetric history: OB History  Gravida Para Term Preterm AB Living  1 0          SAB IAB Ectopic Multiple Live Births                # Outcome Date GA Lbr Len/2nd Weight Sex Delivery Anes PTL Lv  1 Current             Past Surgical History: Past Surgical History:  Procedure Laterality Date   ANTERIOR CRUCIATE LIGAMENT REPAIR     ANTERIOR CRUCIATE LIGAMENT REPAIR Left     Family History: Family History  Problem Relation Age of Onset   Asthma Sister    Cancer Maternal Grandmother    Allergic rhinitis Neg Hx    Angioedema Neg Hx    Atopy Neg Hx    Eczema Neg Hx    Immunodeficiency Neg Hx    Urticaria Neg Hx     Social History: Social History   Tobacco Use   Smoking status: Never   Smokeless tobacco: Never  Vaping Use   Vaping Use: Never used  Substance Use Topics   Alcohol use: No   Drug use: No    Allergies:  Allergies  Allergen Reactions   Flagyl [Metronidazole] Anaphylaxis   Tetracyclines & Related Anaphylaxis   Diflucan [Fluconazole] Swelling   Ibuprofen Other (See Comments)    Damaged kidneys   Other Nausea And Vomiting    Antibiotic (patient cannot recall name/elementary school)    Meds:  Medications Prior to Admission  Medication Sig Dispense Refill Last Dose   ferrous sulfate 325 (65 FE) MG tablet Take 325 mg by mouth daily with  breakfast.   Past Week   Prenatal Vit-Fe Fumarate-FA (PRENATAL MULTIVITAMIN) TABS tablet Take 1 tablet by mouth daily at 12 noon.   03/25/2021   triamcinolone lotion (KENALOG) 0.1 % Apply 1 application topically 3 (three) times daily.   Past Week   valACYclovir (VALTREX) 500 MG tablet Take 500 mg by mouth 2 (two) times daily.   03/25/2021   acetaminophen (TYLENOL) 325 MG tablet Take 650 mg by mouth every 6 (six) hours as needed for mild pain or headache.   More than a month   albuterol (VENTOLIN HFA) 108 (90 Base) MCG/ACT inhaler Inhale 2 puffs into the lungs every 4 (four) hours as needed for wheezing or shortness of breath. 18 g 0 More than a month   famotidine (PEPCID) 20 MG tablet Take 1 tablet (20 mg total) by mouth 2 (two) times daily. (Patient  not taking: Reported on 03/25/2021) 30 tablet 0 Not Taking    I have reviewed patient's Past Medical Hx, Surgical Hx, Family Hx, Social Hx, medications and allergies.   ROS:  Review of Systems  Constitutional:  Negative for chills and fever.  Genitourinary:  Positive for vaginal discharge. Negative for pelvic pain.  Other systems negative  Physical Exam  Patient Vitals for the past 24 hrs:  BP Temp Temp src Pulse Resp SpO2 Height Weight  03/26/21 0005 -- -- -- -- -- 100 % -- --  03/26/21 0000 -- -- -- -- -- 100 % -- --  03/25/21 2355 -- -- -- -- -- 100 % -- --  03/25/21 2352 108/70 -- -- 77 17 -- -- --  03/25/21 2319 119/76 98 F (36.7 C) Oral 87 18 99 % 5' (1.524 m) 93.4 kg   Constitutional: Well-developed, well-nourished female in no acute distress.  Cardiovascular: normal rate and rhythm Respiratory: normal effort, clear to auscultation bilaterally GI: Abd soft, non-tender, gravid appropriate for gestational age.   No rebound or guarding. MS: Extremities nontender, no edema, normal ROM Neurologic: Alert and oriented x 4.  GU: Neg CVAT.  PELVIC EXAM: Cervix pink, visually closed, without lesion, scant clear discharge, vaginal walls and external genitalia normal  Small pooling, one fern on slide, so amnsure sent  FHT:  Baseline 140 , moderate variability, accelerations present, no decelerations Contractions: Irregular     Labs: Results for orders placed or performed during the hospital encounter of 03/25/21 (from the past 24 hour(s))  Amnisure rupture of membrane (rom)not at [facility]     Status: None   Collection Time: 03/26/21  1:08 AM  Result Value Ref Range   Amnisure ROM POSITIVE   POCT fern test     Status: Abnormal   Collection Time: 03/26/21  2:15 AM  Result Value Ref Range   POCT Fern Test Positive = ruptured amniotic membanes   CBC     Status: None   Collection Time: 03/26/21  2:30 AM  Result Value Ref Range   WBC 5.8 4.0 - 10.5 K/uL   RBC 4.09 3.87 - 5.11 MIL/uL    Hemoglobin 12.0 12.0 - 15.0 g/dL   HCT 24.2 35.3 - 61.4 %   MCV 88.3 80.0 - 100.0 fL   MCH 29.3 26.0 - 34.0 pg   MCHC 33.2 30.0 - 36.0 g/dL   RDW 43.1 54.0 - 08.6 %   Platelets 275 150 - 400 K/uL   nRBC 0.0 0.0 - 0.2 %  Type and screen     Status: None (Preliminary result)   Collection Time: 03/26/21  2:30 AM  Result Value Ref Range   ABO/RH(D) B POS    Antibody Screen PENDING    Sample Expiration      03/29/2021,2359 Performed at [facility], [address], [place], [place] [address]   Resp Panel by RT-PCR (Flu A&B, Covid) Nasopharyngeal Swab     Status: None   Collection Time: 03/26/21  2:38 AM   Specimen: Nasopharyngeal Swab; Nasopharyngeal(NP) swabs in vial transport medium  Result Value Ref Range   SARS Coronavirus 2 by RT PCR NEGATIVE NEGATIVE   Influenza A by PCR NEGATIVE NEGATIVE   Influenza B by PCR NEGATIVE NEGATIVE    --/--/B POS (09/21 7510)  Imaging:  Pt informed that the ultrasound is considered a limited OB ultrasound and is not intended to be a complete ultrasound exam.  Patient also informed that the ultrasound is not being completed with the intent of assessing for fetal or placental anomalies or any pelvic abnormalities.  Explained that the purpose of today's ultrasound is to assess for presentation.  Patient acknowledges the purpose of the exam and the limitations of the study.    Fetus is in the vertex presentation  MAU Course/MDM: I have ordered labs and reviewed results.  NST reviewed, reactive  Treatments in MAU included SSE, EFM.    Assessment: Single IUP at [redacted]w[redacted]d PROM at term  Plan: Admit to Labor and Delivery Routine orders Labor team to follow   Wynelle Bourgeois CNM, MSN Certified Nurse-Midwife 03/26/2021 12:25 AM

## 2021-03-26 NOTE — Progress Notes (Signed)
Subjective:    Resting in bed after epidural placement on right side.   Objective:    VS: BP 93/64   Pulse 88   Temp (!) 97.1 F (36.2 C) (Oral)   Resp 16   Ht 5' (1.524 m)   Wt 93.4 kg   LMP 06/22/2020 (Within Days)   SpO2 95%   BMI 40.21 kg/m  FHR : baseline 140 / variability moderate / accelerations present / variable decelerations Toco: contractions every 2-3 minutes Membranes: SROM Dilation: 5 Effacement (%): 100 Cervical Position: Posterior Station: -2 Presentation: Vertex Exam by:: [employer], CNM Pitocin 12 mU/min  Assessment/Plan:   30 y.o. G1P0 [redacted]w[redacted]d  Labor: Progressing normally Preeclampsia:  labs stable Fetal Wellbeing:  Deep declarations when placed on back for foley placement. Recovered to cat 1 after position change Pain Control:  Labor support without medications I/D:  GBS negative Anticipated MOD:  NSVD  Oley Balm MSN, CNM 03/26/2021 11:56 PM

## 2021-03-26 NOTE — Progress Notes (Signed)
Subjective:    Starting to feel pain with some of the contractions.  Objective:    VS: BP 116/69   Pulse 86   Temp 98.1 F (36.7 C) (Oral)   Resp 18   Ht 5' (1.524 m)   Wt 93.4 kg   LMP 06/22/2020 (Within Days)   SpO2 100%   BMI 40.21 kg/m  FHR : baseline 125 / variability moderate / accelerations none / no decelerations Toco: contractions every 2-3 minutes  Membranes: SROM Dilation: 1 Effacement (%): 60 Cervical Position: Posterior Station: -2 Presentation: Vertex Exam by:: [employer], CNM Pitocin 6 mU/min  Assessment/Plan:   30 y.o. G1P0 [redacted]w[redacted]d  Labor:  Latent labor with pitocin augmentation . Will continue to titrate pitocin to achieve stronger contractions.  Preeclampsia:  intake and ouput balanced and labs stable Fetal Wellbeing:  Category I Pain Control:  Labor support without medications I/D:   GBS negative Anticipated MOD:  NSVD  Oley Balm MSN, CNM 03/26/2021 2:03 PM

## 2021-03-26 NOTE — MAU Note (Signed)
Vertex confirmed by bedside US per MAU provider, Mayford Knife, CNM.

## 2021-03-26 NOTE — H&P (Addendum)
OB ADMISSION/ HISTORY & PHYSICAL:  Admission Date: 03/25/2021 11:02 PM  Admit Diagnosis: Spontaneous rupture of membranes   April Crane is a 30 y.o. female G1P0 [redacted]w[redacted]d presenting for leaking of fluid since 2100 on 03/25/21. Endorses active FM, denies vaginal bleeding. Ctx began @ 2100  History of current pregnancy: G1P0   Primary OB Provider: [facility] Patient entered care with [facility] at 15.4 wks.   EDC 03/29/21 by LMP 06/22/20 and congruent w/ 15.4 wk U/S.   Anatomy scan:  23.5 wks, complete w/ anterior placenta.   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  Patient Active Problem List   Diagnosis Date Noted   Normal labor and delivery 03/26/2021   Post-dates pregnancy 03/26/2021   Drug allergy 04/10/2020   Ganglion cyst of dorsum of left wrist 03/01/2019   Pain of joint of left ankle and foot 03/01/2019   Chest pain    Abdominal pain 07/14/2015   Generalized weakness 07/14/2015   AKI (acute kidney injury) (HCC) 07/13/2015   Acute renal failure (ARF) (HCC) 07/13/2015    Prenatal Labs: ABO, Rh: --/--/B POS (11/17 0230) Antibody: NEG (11/17 0230) Rubella:   non immune RPR:   NR HBsAg:   NR HIV:   NR GTT: 124 GBS:   Negative GC/CHL: Negative/Negative Genetics: Positive SMA carrier, declined partner testing, Panaroma low risk Tdap/influenza vaccines: UTD/UTD   OB History  Gravida Para Term Preterm AB Living  1 0          SAB IAB Ectopic Multiple Live Births               # Outcome Date GA Lbr Len/2nd Weight Sex Delivery Anes PTL Lv  1 Current             Medical / Surgical History: Past medical history:  Past Medical History:  Diagnosis Date   Asthma    Headache    No pertinent past medical history    Obstruction of colon (HCC)    Renal disorder      Past surgical history:  Past Surgical History:  Procedure Laterality Date   ANTERIOR CRUCIATE LIGAMENT REPAIR     ANTERIOR CRUCIATE LIGAMENT REPAIR Left    Family History:  Family History  Problem Relation Age of Onset   Asthma Sister    Cancer Maternal Grandmother    Allergic rhinitis Neg Hx    Angioedema Neg Hx    Atopy Neg Hx    Eczema Neg Hx    Immunodeficiency Neg Hx    Urticaria Neg Hx     Social History:  reports that she has never smoked. She has never used smokeless tobacco. She reports that she does not drink alcohol and does not use drugs.  Allergies: Flagyl [metronidazole], Tetracyclines & related, Diflucan [fluconazole], Ibuprofen, and Other   Current Medications at time of admission:  Prior to Admission medications   Medication Sig Start Date End Date Taking? Authorizing Provider  ferrous sulfate 325 (65 FE) MG tablet Take 325 mg by mouth daily with breakfast.   Yes [provider]  Prenatal Vit-Fe Fumarate-FA (PRENATAL MULTIVITAMIN) TABS tablet Take 1 tablet by mouth daily at 12 noon.   Yes [provider]  triamcinolone lotion (KENALOG) 0.1 % Apply 1 application topically 3 (  three) times daily.   Yes [provider]  valACYclovir (VALTREX) 500 MG tablet Take 500 mg by mouth 2 (two) times daily.   Yes [provider]  acetaminophen (TYLENOL) 325 MG tablet Take 650 mg by mouth every 6 (six) hours as needed for mild pain or headache.    [provider]  albuterol (VENTOLIN HFA) 108 (90 Base) MCG/ACT inhaler Inhale 2 puffs into the lungs every 4 (four) hours as needed for wheezing or shortness of breath. 12/24/19   Horton, Barbette Hair, MD  famotidine (PEPCID) 20 MG tablet Take 1 tablet (20 mg total) by mouth 2 (two) times daily. Patient not taking: Reported on 03/25/2021 10/04/20   Seabron Spates, CNM    Review of Systems: Constitutional: Negative   HENT: Negative   Eyes: Negative   Respiratory: Negative    Cardiovascular: Negative   Gastrointestinal: Negative  Genitourinary: negative for bloody show, positive for LOF   Musculoskeletal: Negative   Skin: Negative   Neurological: Negative   Endo/Heme/Allergies: Negative   Psychiatric/Behavioral: Negative    Physical Exam: VS: Blood pressure (!) 92/55, pulse 77, temperature 98.7 F (37.1 C), resp. rate 18, height 5' (1.524 m), weight 93.4 kg, last menstrual period 06/22/2020, SpO2 100 %. AAO x3, no signs of distress Cardiovascular: RRR Respiratory: Lung fields clear to ausculation GU/GI: Abdomen gravid, non-tender, non-distended, active FM, vertex, EFW 8lbs per Leopold's Extremities: negative edema, negative for pain, tenderness, and cords  Cervical exam:Dilation: 1 Effacement (%): 50 Station: Ballotable Exam by:: Youlanda Roys, RN FHR: baseline rate 135 / variability moderate / accelerations absent / absent decelerations TOCO: 3-7   Prenatal Transfer Tool  Maternal Diabetes: No Genetic Screening: Abnormal:  Results: Other:SMA carrier. Declined partner testing. Panorama low risk Maternal Ultrasounds/Referrals: No Fetal Ultrasounds or other Referrals:  None Maternal Substance Abuse:  No Significant Maternal Medications:  Valtrex Significant Maternal Lab Results: Group B Strep negative    Assessment: 30 y.o. G1P0 [redacted]w[redacted]d Admitted for SPROM @ 2100 on 03/26/21. Clear fluid.  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  Hx of acute renal failure d/t NSAIDS use 0 No NSAIDS  Latent stage of labor FHR category 1 GBS negative Pain management plan: IV pain medication, epidural PRN   Plan:  Admit to L&D Routine admission orders Epidural PRN Pitocin titrate 2x2  Will notify Dr Alesia Richards and Kathalene Frames CNM of admission and plan of care  Domingo Pulse MSN, CNM 03/26/2021 6:10 AM

## 2021-03-26 NOTE — Progress Notes (Signed)
Subjective:    Not reporting any discomfort with ctxs.   Objective:    VS: BP 108/73   Pulse 84   Temp 98.1 F (36.7 C) (Oral)   Resp 18   Ht 5' (1.524 m)   Wt 93.4 kg   LMP 06/22/2020 (Within Days)   SpO2 100%   BMI 40.21 kg/m  FHR : baseline 130 / variability moderate / accelerations present / variable decelerations Toco: contractions every 2-5 minutes  Membranes: SROM Dilation: 1 Effacement (%): 60 Cervical Position: Posterior Station: -2 Presentation: Vertex Exam by:: Laurel Dimmer., CNM Pitocin 2 mU/min  Assessment/Plan:   30 y.o. G1P0 [redacted]w[redacted]d  Labor: Continues on pitocin.  Preeclampsia:  no signs or symptoms of toxicity, intake and ouput balanced, and labs stable Fetal Wellbeing:  Category I Pain Control:  Labor support without medications I/D:   GBS negative Anticipated MOD:  NSVD  Oley Balm MSN, CNM 03/26/2021 11:22 AM

## 2021-03-26 NOTE — Anesthesia Preprocedure Evaluation (Addendum)
Anesthesia Evaluation  Patient identified by MRN, date of birth, ID band Patient awake    Reviewed: Allergy & Precautions, NPO status , Patient's Chart, lab work & pertinent test results  History of Anesthesia Complications Negative for: history of anesthetic complications  Airway Mallampati: II  TM Distance: >3 FB Neck ROM: Full    Dental no notable dental hx.    Pulmonary asthma ,    Pulmonary exam normal        Cardiovascular negative cardio ROS Normal cardiovascular exam     Neuro/Psych negative neurological ROS  negative psych ROS   GI/Hepatic negative GI ROS, Neg liver ROS,   Endo/Other  Morbid obesity  Renal/GU negative Renal ROS  negative genitourinary   Musculoskeletal negative musculoskeletal ROS (+)   Abdominal   Peds  Hematology negative hematology ROS (+)   Anesthesia Other Findings Day of surgery medications reviewed with patient.  Reproductive/Obstetrics (+) Pregnancy                           Anesthesia Physical Anesthesia Plan  ASA: 3 and emergent  Anesthesia Plan: Epidural   Post-op Pain Management:    Induction:   PONV Risk Score and Plan: Treatment may vary due to age or medical condition, Ondansetron and Dexamethasone  Airway Management Planned: Natural Airway  Additional Equipment:   Intra-op Plan:   Post-operative Plan:   Informed Consent: I have reviewed the patients History and Physical, chart, labs and discussed the procedure including the risks, benefits and alternatives for the proposed anesthesia with the patient or authorized representative who has indicated his/her understanding and acceptance.       Plan Discussed with: CRNA  Anesthesia Plan Comments: (Epidural to be used for urgent C/S (NRFHT). Stephannie Peters, MD)      Anesthesia Quick Evaluation

## 2021-03-26 NOTE — Anesthesia Procedure Notes (Signed)
Epidural Patient location during procedure: OB Start time: 03/26/2021 8:39 PM End time: 03/26/2021 8:43 PM  Staffing Anesthesiologist: Kaylyn Layer, MD Performed: anesthesiologist   Preanesthetic Checklist Completed: patient identified, IV checked, risks and benefits discussed, monitors and equipment checked, pre-op evaluation and timeout performed  Epidural Patient position: sitting Prep: DuraPrep and site prepped and draped Patient monitoring: continuous pulse ox, blood pressure and heart rate Approach: midline Location: L3-L4 Injection technique: LOR air  Needle:  Needle type: Tuohy  Needle gauge: 17 G Needle length: 9 cm Needle insertion depth: 7 cm Catheter type: closed end flexible Catheter size: 19 Gauge Catheter at skin depth: 12 cm Test dose: negative and Other (1% lidocaine)  Assessment Events: blood not aspirated, injection not painful, no injection resistance, no paresthesia and negative IV test  Additional Notes Patient identified. Risks, benefits, and alternatives discussed with patient including but not limited to bleeding, infection, nerve damage, paralysis, failed block, incomplete pain control, headache, blood pressure changes, nausea, vomiting, reactions to medication, itching, and postpartum back pain. Confirmed with bedside nurse the patient's most recent platelet count. Confirmed with patient that they are not currently taking any anticoagulation, have any bleeding history, or any family history of bleeding disorders. Patient expressed understanding and wished to proceed. All questions were answered. Sterile technique was used throughout the entire procedure. Please see nursing notes for vital signs.   Crisp LOR on first pass. Test dose was given through epidural catheter and negative prior to continuing to dose epidural or start infusion. Warning signs of high block given to the patient including shortness of breath, tingling/numbness in hands, complete  motor block, or any concerning symptoms with instructions to call for help. Patient was given instructions on fall risk and not to get out of bed. All questions and concerns addressed with instructions to call with any issues or inadequate analgesia.  Reason for block:procedure for pain

## 2021-03-27 ENCOUNTER — Encounter (HOSPITAL_COMMUNITY): Payer: Self-pay | Admitting: Obstetrics & Gynecology

## 2021-03-27 LAB — CBC
HCT: 31.3 % — ABNORMAL LOW (ref 36.0–46.0)
Hemoglobin: 10.5 g/dL — ABNORMAL LOW (ref 12.0–15.0)
MCH: 29.1 pg (ref 26.0–34.0)
MCHC: 33.5 g/dL (ref 30.0–36.0)
MCV: 86.7 fL (ref 80.0–100.0)
Platelets: 258 10*3/uL (ref 150–400)
RBC: 3.61 MIL/uL — ABNORMAL LOW (ref 3.87–5.11)
RDW: 14.3 % (ref 11.5–15.5)
WBC: 13.2 10*3/uL — ABNORMAL HIGH (ref 4.0–10.5)
nRBC: 0 % (ref 0.0–0.2)

## 2021-03-27 LAB — CREATININE, SERUM
Creatinine, Ser: 0.57 mg/dL (ref 0.44–1.00)
GFR, Estimated: >60 mL/min (ref >60)

## 2021-03-27 MED ORDER — SODIUM CHLORIDE 0.9% FLUSH: 3.0000 mL | INTRAVENOUS | Status: DC | PRN

## 2021-03-27 MED ORDER — KETOROLAC TROMETHAMINE 30 MG/ML IJ SOLN: 30.0000 mg | Freq: Four times a day (QID) | INTRAMUSCULAR | Status: DC | PRN

## 2021-03-27 MED ORDER — SENNOSIDES-DOCUSATE SODIUM 8.6-50 MG PO TABS
2.0000 | ORAL_TABLET | Freq: Every day | ORAL | Status: DC
  Administered 2021-03-28 – 2021-03-29 (×2): 2 via ORAL
  Filled 2021-03-27 (×2): qty 2

## 2021-03-27 MED ORDER — ONDANSETRON HCL 4 MG/2ML IJ SOLN: 4.0000 mg | Freq: Three times a day (TID) | INTRAMUSCULAR | Status: DC | PRN

## 2021-03-27 MED ORDER — KETOROLAC TROMETHAMINE 30 MG/ML IJ SOLN
INTRAMUSCULAR | Status: AC
  Filled 2021-03-27: qty 1

## 2021-03-27 MED ORDER — PROMETHAZINE HCL 25 MG/ML IJ SOLN: 6.2500 mg | INTRAMUSCULAR | Status: DC | PRN

## 2021-03-27 MED ORDER — DIBUCAINE (PERIANAL) 1 % EX OINT: 1.0000 "application " | TOPICAL_OINTMENT | CUTANEOUS | Status: DC | PRN

## 2021-03-27 MED ORDER — ENOXAPARIN SODIUM 40 MG/0.4ML IJ SOSY
40.0000 mg | PREFILLED_SYRINGE | INTRAMUSCULAR | Status: DC
  Administered 2021-03-27 – 2021-03-28 (×2): 40 mg via SUBCUTANEOUS
  Filled 2021-03-27 (×2): qty 0.4

## 2021-03-27 MED ORDER — NALOXONE HCL 4 MG/10ML IJ SOLN
1.0000 ug/kg/h | INTRAVENOUS | Status: DC | PRN
  Filled 2021-03-27: qty 5

## 2021-03-27 MED ORDER — WITCH HAZEL-GLYCERIN EX PADS: 1.0000 "application " | MEDICATED_PAD | CUTANEOUS | Status: DC | PRN

## 2021-03-27 MED ORDER — SIMETHICONE 80 MG PO CHEW
80.0000 mg | CHEWABLE_TABLET | ORAL | Status: DC | PRN
  Administered 2021-03-28 (×2): 80 mg via ORAL
  Filled 2021-03-27 (×2): qty 1

## 2021-03-27 MED ORDER — NALBUPHINE HCL 10 MG/ML IJ SOLN: 5.0000 mg | Freq: Once | INTRAMUSCULAR | Status: DC | PRN

## 2021-03-27 MED ORDER — PRENATAL MULTIVITAMIN CH
1.0000 | ORAL_TABLET | Freq: Every day | ORAL | Status: DC
  Administered 2021-03-27 – 2021-03-29 (×3): 1 via ORAL
  Filled 2021-03-27 (×3): qty 1

## 2021-03-27 MED ORDER — COCONUT OIL OIL: 1.0000 "application " | TOPICAL_OIL | Status: DC | PRN

## 2021-03-27 MED ORDER — DIPHENHYDRAMINE HCL 25 MG PO CAPS: 25.0000 mg | ORAL_CAPSULE | Freq: Four times a day (QID) | ORAL | Status: DC | PRN

## 2021-03-27 MED ORDER — OXYCODONE HCL 5 MG PO TABS
5.0000 mg | ORAL_TABLET | ORAL | Status: DC | PRN
  Administered 2021-03-29: 5 mg via ORAL
  Filled 2021-03-27: qty 1

## 2021-03-27 MED ORDER — ACETAMINOPHEN 500 MG PO TABS
1000.0000 mg | ORAL_TABLET | Freq: Four times a day (QID) | ORAL | Status: DC
  Administered 2021-03-27 – 2021-03-29 (×10): 1000 mg via ORAL
  Filled 2021-03-27 (×12): qty 2

## 2021-03-27 MED ORDER — NALOXONE HCL 0.4 MG/ML IJ SOLN: 0.4000 mg | INTRAMUSCULAR | Status: DC | PRN

## 2021-03-27 MED ORDER — NALBUPHINE HCL 10 MG/ML IJ SOLN: 5.0000 mg | INTRAMUSCULAR | Status: DC | PRN

## 2021-03-27 MED ORDER — TETANUS-DIPHTH-ACELL PERTUSSIS 5-2.5-18.5 LF-MCG/0.5 IM SUSY: 0.5000 mL | PREFILLED_SYRINGE | Freq: Once | INTRAMUSCULAR | Status: DC

## 2021-03-27 MED ORDER — ZOLPIDEM TARTRATE 5 MG PO TABS: 5.0000 mg | ORAL_TABLET | Freq: Every evening | ORAL | Status: DC | PRN

## 2021-03-27 MED ORDER — KETOROLAC TROMETHAMINE 30 MG/ML IJ SOLN
30.0000 mg | Freq: Four times a day (QID) | INTRAMUSCULAR | Status: DC | PRN
  Administered 2021-03-27: 30 mg via INTRAVENOUS

## 2021-03-27 MED ORDER — FENTANYL CITRATE (PF) 100 MCG/2ML IJ SOLN: 25.0000 ug | INTRAMUSCULAR | Status: DC | PRN

## 2021-03-27 MED ORDER — GABAPENTIN 100 MG PO CAPS
100.0000 mg | ORAL_CAPSULE | Freq: Three times a day (TID) | ORAL | Status: DC
  Administered 2021-03-27 – 2021-03-28 (×4): 100 mg via ORAL
  Filled 2021-03-27 (×4): qty 1

## 2021-03-27 MED ORDER — ACETAMINOPHEN 500 MG PO TABS: 1000.0000 mg | ORAL_TABLET | Freq: Four times a day (QID) | ORAL | Status: DC

## 2021-03-27 MED ORDER — MENTHOL 3 MG MT LOZG: 1.0000 | LOZENGE | OROMUCOSAL | Status: DC | PRN

## 2021-03-27 MED ORDER — DIPHENHYDRAMINE HCL 50 MG/ML IJ SOLN: 12.5000 mg | Freq: Four times a day (QID) | INTRAMUSCULAR | Status: DC | PRN

## 2021-03-27 MED ORDER — HYDROMORPHONE HCL 1 MG/ML IJ SOLN: 0.2000 mg | INTRAMUSCULAR | Status: DC | PRN

## 2021-03-27 MED ORDER — OXYTOCIN-SODIUM CHLORIDE 30-0.9 UT/500ML-% IV SOLN
2.5000 [IU]/h | INTRAVENOUS | Status: AC
  Administered 2021-03-27: 2.5 [IU]/h via INTRAVENOUS

## 2021-03-27 NOTE — Transfer of Care (Signed)
Immediate Anesthesia Transfer of Care Note  Patient: April Crane  Procedure(s) Performed: CESAREAN SECTION  Patient Location: PACU  Anesthesia Type:Regional and Epidural  Level of Consciousness: awake, alert , oriented and patient cooperative  Airway & Oxygen Therapy: Patient Spontanous Breathing  Post-op Assessment: Report given to RN and Post -op Vital signs reviewed and stable  Post vital signs: Reviewed and stable  Last Vitals:  Vitals Value Taken Time  BP 100/64 03/27/21 0019  Temp    Pulse 93 03/27/21 0028  Resp 18 03/27/21 0028  SpO2 98 % 03/27/21 0028  Vitals shown include unvalidated device data.  Last Pain:  Vitals:   03/26/21 2207  TempSrc:   PainSc: 0-No pain         Complications: No notable events documented.

## 2021-03-27 NOTE — Lactation Note (Signed)
This note was copied from a baby's chart. Lactation Consultation Note  Patient Name: April Crane QDIYM'E Date: 03/27/2021 Reason for consult: Initial assessment Age:30 hours P1, Mother reports that infant had a 60 min feeding on and off . Assist mother with hand expression. Mother has milk that is flowing.  Infant placed in football hold. She was sleepy and showing no feeding cues . Attempts to rouse infant and infant took a few sucks , no latch achieved. Showing mother cross cradle hold and infant took a few sucks.  Infant was fed 4 ml s of ebm with a spoon.  Basic teach done. Advised mother to  use hand expression and then use hand pump. . Mother to offer infant spoon with ebm if unable to rouse for feeding.  Mother to call for assistance as needed.   Maternal Data Has patient been taught Hand Expression?: Yes Does the patient have breastfeeding experience prior to this delivery?: No  Feeding Mother's Current Feeding Choice: Breast Milk  LATCH Score                    Lactation Tools Discussed/Used    Interventions Interventions: Breast feeding basics reviewed;Assisted with latch;Skin to skin;Hand express;Breast compression;Adjust position;Support pillows;Position options;Hand pump;LC Services brochure  Discharge Pump: Personal ([place]) WIC Program: Yes  Consult Status Consult Status: Follow-up Date: 03/27/21 Follow-up type: In-patient    Stevan Born [facility] 03/27/2021, 12:04 PM

## 2021-03-27 NOTE — Progress Notes (Signed)
April Crane is a 30 y.o. G1P0 at [redacted]w[redacted]d admitted for rupture of membranes  Subjective: Patient is comfortable with epidural.  Objective: BP 93/64   Pulse 88   Temp (!) 97.1 F (36.2 C) (Oral)   Resp 16   Ht 5' (1.524 m)   Wt 93.4 kg   LMP 06/22/2020 (Within Days)   SpO2 95%   BMI 40.21 kg/m  No intake/output data recorded. Total I/O In: 1000 [I.V.:1000] Out: 873 [Urine:425; Blood:448]  FHT:  FHR: 130 bpm, variability: moderate,  accelerations:  Present,  decelerations:  Present Deep late and variable decelerations UC:   irregular, every 3 to 5 minutes SVE:   Dilation: 5 Effacement (%): 100 Station: -2 Exam by:: Dr. Sallye Ober  Labs: Lab Results  Component Value Date   WBC 5.8 03/26/2021   HGB 12.0 03/26/2021   HCT 36.1 03/26/2021   MCV 88.3 03/26/2021   PLT 275 03/26/2021    Assessment / Plan: Premature rupture of membranes   Labor:  Fetal decelerations noted after epidural placement. Pitocin has been stopped. Ephedrine and phenylephrine has been given by anesthesia and positional changes and IV hydration with improvement in fetal heart tracing - Will continue with close monitoring.  - I discussed with patient and family that if with continued abnormal fetal heart tracing and remote form delivery that a cesarean delivery will be required for concern of fetal status. We discussed risks, benefits and alternatives of cesarean section including risks of heavy bleeding, infection and damage to organs.  Fetal Wellbeing:  Category II Pain Control:  Epidural I/D:   GBS Negative Anticipated MOD:   Possibly Cesarean section if EFM does not improve.   Myrene Galas [facility] 03/27/2021, 12:21 AM

## 2021-03-27 NOTE — Anesthesia Postprocedure Evaluation (Signed)
Anesthesia Post Note  Patient: KANIJAH GROSECLOSE  Procedure(s) Performed: CESAREAN SECTION     Patient location during evaluation: PACU Anesthesia Type: Epidural Level of consciousness: awake and alert and oriented Pain management: pain level controlled Vital Signs Assessment: post-procedure vital signs reviewed and stable Respiratory status: spontaneous breathing, nonlabored ventilation and respiratory function stable Cardiovascular status: blood pressure returned to baseline Postop Assessment: epidural receding, no apparent nausea or vomiting, no headache and no backache Anesthetic complications: no   No notable events documented.  Last Vitals:  Vitals:   03/27/21 0234 03/27/21 0244  BP: 98/63   Pulse: 88   Resp:    Temp:  (!) 36.2 C  SpO2: 97%     Last Pain:  Vitals:   03/27/21 0244  TempSrc: Axillary  PainSc:    Pain Goal:    LLE Motor Response: Purposeful movement (03/27/21 0230) LLE Sensation: Tingling (03/27/21 0230) RLE Motor Response: Purposeful movement (03/27/21 0230) RLE Sensation: Tingling (03/27/21 0230)        Shanda Howells

## 2021-03-27 NOTE — Op Note (Addendum)
Patient: April Crane DOB: 1991/02/07 MRN:  [id number]  DATE OF SURGERY:  03/26/2021  PREOP DIAGNOSIS:  1. 39 week 4 day  EGA intrauterine pregnancy  2. Augmentation of labor after presenting with premature rupture of membranes.  3. Abnormal fetal heart tracing with recurrent variable and late decelerations.  4.  Remote from delivery at 5 cm dilation.  POSTOP DIAGNOSIS: Same as above.  PROCEDURE: Urgent primary low uterine segment transverse cesarean section via Pfannenstiel incision.     SURGEON: Dr.  Hoover Browns  ASSISTANT:Scrub technician Tiffany.   ANESTHESIA: Bolused epidural.   COMPLICATIONS: None  FINDINGS: Viable female infant in cephalic presentation, LOT, weight 6lbs 9 oz, Apgar scores of 9 and 10. Normal uterus and fallopian tubes and ovaries bilaterally.    EBL:  398 cc  IV FLUID:  1300 cc LR   URINE OUTPUT: 300 cc clear urine  INDICATIONS: 30 y/o G1P0 who presented for a primary cesarean section for abnormal fetal heart tracing.   PROCEDURE:   Informed consent was obtained from the patient to undergo the procedure after explaining risks benefits and alternatives of the procedure including risks of bleeding, infection and damage to organs.  She was taken to the operating room where her spinal anesthesia was found to be adequate. She was prepped and draped in the usual sterile fashion and a Foley catheter was placed. She received 2 g of IV Ancef preoperatively. A Pfannenstiel incision was made with the scalpel and the incision extended through the subcutaneous layer and also the fascia with the bovie. Small perforators in the subcutaneous layer were contained with the Bovie. The fascia was nicked in the midline and then was further separated from the rectus muscles bilaterally using Mayo scissors. Kochers were placed inferiorly and then superiorly to allow further separation of fascia from the rectus muscles.  The peritoneal cavity was entered bluntly with the fingers.  The Alexis retractor was placed in. The bladder flap was created using Metzenbaum scissors.   The bowel was packed away with moist laps.  The uterus was incised with a scalpel and the incision extended with bandage scissors  bilaterally.  Clear amniotic fluid was noted.  The head then the rest of the body was then delivered with abdominal pressure.  She delivered a viable female infant, apgar scores 8,9.  Delayed cord clamping was done.  Cord blood was collected.    The uterus was not exteriorized.  The edges of the uterus was grasped with T. Clamps. The placenta was delivered with gentle traction on the umbilical cord. The uterus was cleared of clots and debris with a lap.  The incision uterine incision was closed with #1 Vicryl in a running locked stitch. An imbricating layer of same stitch was placed over the initial closure.  Irrigation was applied and suctioned out. Excellent hemostasis was noted over the incision.  The muscles and peritoneum were then reapproximated using chromic suture.  Fascia was closed using 0 Vicryl in a running stitch. The subcutaneous layer was irrigated and suctioned out. Small perforators were contained with the bovie.  The subcutaneous layer was closed over using 1-0 plain in interrupted stitches. The skin was closed using 4-0 vicryl on Keith needle. Dermabond was applied. Honeycomb and pressure dressings were then applied. The patient was then cleaned and she was taken to the recovery room with her neonate in stable condition.   SPECIMEN: Placenta, umbilical cord blood  DISPOSITION: TO PACU, STABLE.

## 2021-03-27 NOTE — Progress Notes (Signed)
MD Progress Note  April Crane [id number] POD# 0/1 Primary CS for: Non-reassuring fetal heart tracing Information for the patient's newborn:  April, Crane [[id number]]  female   Subjective: Patient reports no pain, she is "tired" Feeding: unknown Reports tolerating PO, no N/V, ambulating and voiding w/o difficulty  Pain controlled with ibuprofen (OTC) and narcotic analgesics including oxycodone (Oxycontin, Oxyir) Denies chest pain HA/SOB/dizziness  Flatus yes Vaginal bleeding is normal, no clots   Objective:  VS:  Vitals:   03/27/21 0614 03/27/21 0743 03/27/21 0910 03/27/21 0957  BP: 109/75   113/85  Pulse: 92   87  Resp: 18 19 20 20   Temp: 97.6 F (36.4 C)   97.7 F (36.5 C)  TempSrc: Axillary   Oral  SpO2: 99% 99% 100% 100%  Weight:      Height:        Intake/Output Summary (Last 24 hours) at 03/27/2021 1122 Last data filed at 03/27/2021 0900 Gross per 24 hour  Intake 4227.73 ml  Output 3348 ml  Net 879.73 ml     Recent Labs    03/26/21 0230 03/27/21 0513  WBC 5.8 13.2*  HGB 12.0 10.5*  HCT 36.1 31.3*  PLT 275 258    Blood type: --/--/B POS (11/17 0230) Rubella: Nonimmune (06/07 0000)    Physical Exam:  General: alert, cooperative, fatigued, and no distress CV: S1S2 present Resp: not examined Abdomen: soft, nontender Incision: clean, dry, and pressure dressing in place Uterine Fundus: firm, below umbilicus, nontender Lochia: minimal Ext:  SCDs in place   Assessment/Plan: 30 y.o.   POD# 1. G1P0                  Principal Problem:   Normal labor and delivery Active Problems:   Post-dates pregnancy   Cesarean delivery delivered   Routine post-op PP care          Advance diet as tolerated Advised warm fluids and ambulation to improve GI motility Breastfeeding support Anticipate D/C either tomorrow or Sunday  Thursday, MD 03/27/2021, 11:22 AM

## 2021-03-28 MED ORDER — CYCLOBENZAPRINE HCL 10 MG PO TABS
5.0000 mg | ORAL_TABLET | Freq: Three times a day (TID) | ORAL | Status: DC | PRN
  Administered 2021-03-28 – 2021-03-29 (×3): 5 mg via ORAL
  Filled 2021-03-28 (×3): qty 1

## 2021-03-28 MED ORDER — GABAPENTIN 100 MG PO CAPS
300.0000 mg | ORAL_CAPSULE | Freq: Three times a day (TID) | ORAL | Status: DC
  Administered 2021-03-28 – 2021-03-29 (×3): 300 mg via ORAL
  Filled 2021-03-28 (×3): qty 3

## 2021-03-28 NOTE — Progress Notes (Signed)
Pt reports pain is well-controlled since starting Gabapentin.   Rhea Pink, MSN, CNM 03/28/2021 10:16 PM

## 2021-03-28 NOTE — Progress Notes (Signed)
PPD# 2 Primary c-section    S:   Reports feeling more uterine and abdominal cramping, not well controlled Tolerating PO fluid and solids No nausea or vomiting Bleeding is light Up ad lib / ambulatory / voiding w/o difficulty Breast    O:   VS: BP 109/74 (BP Location: Right Arm)   Pulse 89   Temp 98.2 F (36.8 C) (Oral)   Resp 16   Ht 5' (1.524 m)   Wt 93.4 kg   LMP 06/22/2020 (Within Days)   SpO2 99%   BMI 40.21 kg/m   LABS:  Recent Labs    03/26/21 0230 03/27/21 0513  WBC 5.8 13.2*  HGB 12.0 10.5*  PLT 275 258   Blood type: --/--/B POS (11/17 0230) Rubella: Nonimmune (06/07 0000)                      I&O: Intake/Output      11/18 0701 11/19 0700 11/19 0701 11/20 0700   I.V. (mL/kg)     Other     Total Intake(mL/kg)     Urine (mL/kg/hr) 3125 (1.4)    Blood     Total Output 3125    Net -3125           Physical Exam: Alert and oriented X3 Abdomen: soft, non-tender, non-distended  Fundus: firm, non-tender, U-1 incision: clean dry, intact Lochia: normal Extremities: No edema, no calf pain or tenderness    Assessment/Plan: POD # 2  30 y.o., G1P0 S/P:C-section with labor  Continue current post operative care Patient with poor pain management as due to prior kidney failure unable to utilize ibuprofen Will add flexiril and gabapentin increased to 300 mg TID to control pain Will discharge tomorrow   LOS: 2 days   Essie Hart MD 03/28/2021 1:49 PM

## 2021-03-28 NOTE — Progress Notes (Signed)
At 1640 patient called out to say she can't move her right leg and needs help getting baby out of crib to breastfeed.   While patient breastfeeds her baby, she is lifting her knees up and feels some tingling and soreness in right upper leg. Homans sign negative, no swelling or redness in calf or thigh.   After patient finished breastfeeding her baby, she was assisted out of bed to ambulate to bathroom. Pt states no difficulty at this time. It appears she fell asleep in an awkward position which may have caused her right leg to go numb initially. Instructed patient to call out again if she feels that loss of sensation in her leg again.

## 2021-03-29 MED ORDER — OXYCODONE-ACETAMINOPHEN 5-325 MG PO TABS: 1.0000 | ORAL_TABLET | ORAL | 0 refills | Status: DC | PRN

## 2021-03-29 MED ORDER — GABAPENTIN 300 MG PO CAPS: 300.0000 mg | ORAL_CAPSULE | Freq: Three times a day (TID) | ORAL | 3 refills | Status: DC | PRN

## 2021-03-29 MED ORDER — CYCLOBENZAPRINE HCL 5 MG PO TABS: 5.0000 mg | ORAL_TABLET | Freq: Three times a day (TID) | ORAL | 0 refills | Status: DC | PRN

## 2021-03-29 MED ORDER — ACETAMINOPHEN 500 MG PO TABS
1000.0000 mg | ORAL_TABLET | Freq: Four times a day (QID) | ORAL | 0 refills | Status: DC
Start: 2021-03-29 — End: 2021-10-27

## 2021-03-29 NOTE — Lactation Note (Signed)
This note was copied from a baby's chart. Lactation Consultation Note  Patient Name: April Crane April Crane Date: 03/29/2021 Reason for consult: Follow-up assessment;Primapara;Term;1st time breastfeeding Age:30 hours   P1 mother whose infant is now 29 hours old.  This is a term baby at 39+4 weeks.  Pediatrician in room when I arrived.  Baby had recently breast fed for 6 minutes.  After exam mother latched "[name]" again and I observed her feeding for an additional 10 minutes.  Audible swallows noted and baby content after feeding.  Provided coconut oil for comfort; nipples intact and rounded.   Mother will continue to feed on cue or at least 8-12 times/24 hours.  Manual pump with instructions for use given.  #24 flange size is appropriate at this time.  Mother has our OP phone number for any further questions/concerns.  Support person present and asleep on the couch.   Maternal Data    Feeding Mother's Current Feeding Choice: Breast Milk  LATCH Score Latch: Grasps breast easily, tongue down, lips flanged, rhythmical sucking.  Audible Swallowing: Spontaneous and intermittent  Type of Nipple: Everted at rest and after stimulation  Comfort (Breast/Nipple): Soft / non-tender  Hold (Positioning): No assistance needed to correctly position infant at breast.  LATCH Score: 10   Lactation Tools Discussed/Used Tools: Pump;Coconut oil Breast pump type: Manual Pump Education: Setup, frequency, and cleaning Reason for Pumping: PRN Pumping frequency: PRN  Interventions Interventions: Breast feeding basics reviewed;Skin to skin;Education  Discharge Discharge Education: Engorgement and breast care Pump: Personal  Consult Status Consult Status: Complete Date: 03/29/21 Follow-up type: Call as needed    Kemal Amores R Meryle Pugmire 03/29/2021, 7:41 AM

## 2021-03-29 NOTE — Discharge Summary (Signed)
Postpartum Discharge Summary  Date of Service updated 03/29/21     Patient Name: April Crane DOB: 06/22/1990 MRN: [id number]  Date of admission: 03/25/2021 Delivery date:03/26/2021  Delivering provider: Waymon Amato  Date of discharge: 03/29/2021  Admitting diagnosis: Normal labor and delivery [O80] Post-dates pregnancy [O48.0] Cesarean delivery delivered [O82] Intrauterine pregnancy: [redacted]w[redacted]d    Secondary diagnosis:  Principal Problem:   Normal labor and delivery Active Problems:   Post-dates pregnancy   Cesarean delivery delivered  Additional problems: None    Discharge diagnosis: Term Pregnancy Delivered                                              Post partum procedures: none Augmentation: Pitocin Complications: None  Hospital course: Onset of Labor With Unplanned C/S   30y.o. yo G1P0 at 443w0das admitted in Latent Labor on 03/25/2021 with diagnosis of SROM.  Patient had a labor course significant for progressing to 5cm but there was evidence of fetal intolerance of labor. The patient went for cesarean section due to Non-Reassuring FHR. Delivery details as follows: Membrane Rupture Time/Date: 9:00 PM ,03/25/2021   Delivery Method:C-Section, Low Vertical  Details of operation can be found in separate operative note. Patient had an uncomplicated postpartum course.  She is ambulating,tolerating a regular diet, passing flatus, and urinating well.  Patient is discharged home in stable condition 03/29/21.  Newborn Data: Birth date:03/26/2021  Birth time:11:20 PM  Gender:Female  Living status:Living  Apgars:9 ,10  [phone]   Magnesium Sulfate received: No BMZ received: No Rhophylac:No MMR:No T-DaP:Given prenatally Flu: No Transfusion:No  Physical exam  Vitals:   03/28/21 0434 03/28/21 1405 03/28/21 2145 03/29/21 0553  BP: 109/74 106/77 102/65 (!) 91/56  Pulse: 89 88 (!) 107 (!) 114  Resp:  18  18  Temp: 98.2 F (36.8 C) 98 F (36.7 C) 98.8 F  (37.1 C) 98.1 F (36.7 C)  TempSrc: Oral Oral Oral Oral  SpO2: 99%  100% 97%  Weight:      Height:       General: alert, cooperative, and no distress Lochia: appropriate Uterine Fundus: firm Incision: Healing well with no significant drainage, No significant erythema, Dressing is clean, dry, and intact DVT Evaluation: No evidence of DVT seen on physical exam. Negative Homan's sign. No cords or calf tenderness. Labs: Lab Results  Component Value Date   WBC 13.2 (H) 03/27/2021   HGB 10.5 (L) 03/27/2021   HCT 31.3 (L) 03/27/2021   MCV 86.7 03/27/2021   PLT 258 03/27/2021   CMP Latest Ref Rng & Units 03/27/2021  Glucose 70 - 99 mg/dL -  BUN 6 - 20 mg/dL -  Creatinine 0.44 - 1.00 mg/dL 0.57  Sodium 135 - 145 mmol/L -  Potassium 3.5 - 5.1 mmol/L -  Chloride 98 - 111 mmol/L -  CO2 22 - 32 mmol/L -  Calcium 8.9 - 10.3 mg/dL -  Total Protein 6.5 - 8.1 g/dL -  Total Bilirubin 0.3 - 1.2 mg/dL -  Alkaline Phos 38 - 126 U/L -  AST 15 - 41 U/L -  ALT 0 - 44 U/L -   Edinburgh Score: Edinburgh Postnatal Depression Scale Screening Tool 03/28/2021  I have been able to laugh and see the funny side of things. 0  I have looked forward with enjoyment to things. 0  I have blamed myself unnecessarily when things went wrong. 1  I have been anxious or worried for no good reason. 1  I have felt scared or panicky for no good reason. 0  Things have been getting on top of me. 0  I have been so unhappy that I have had difficulty sleeping. 0  I have felt sad or miserable. 0  I have been so unhappy that I have been crying. 0  The thought of harming myself has occurred to me. 0  Edinburgh Postnatal Depression Scale Total 2      After visit meds:  Allergies as of 03/29/2021       Reactions   Flagyl [metronidazole] Anaphylaxis   Tetracyclines & Related Anaphylaxis   Diflucan [fluconazole] Swelling   Ibuprofen Other (See Comments)   Damaged kidneys   Other Nausea And Vomiting    Antibiotic (patient cannot recall name/elementary school)        Medication List     STOP taking these medications    famotidine 20 MG tablet Commonly known as: PEPCID   valACYclovir 500 MG tablet Commonly known as: VALTREX       TAKE these medications    acetaminophen 500 MG tablet Commonly known as: TYLENOL Take 2 tablets (1,000 mg total) by mouth every 6 (six) hours. What changed:  medication strength how much to take when to take this reasons to take this   albuterol 108 (90 Base) MCG/ACT inhaler Commonly known as: VENTOLIN HFA Inhale 2 puffs into the lungs every 4 (four) hours as needed for wheezing or shortness of breath.   cyclobenzaprine 5 MG tablet Commonly known as: FLEXERIL Take 1 tablet (5 mg total) by mouth 3 (three) times daily as needed (cramps).   ferrous sulfate 325 (65 FE) MG tablet Take 325 mg by mouth daily with breakfast.   gabapentin 300 MG capsule Commonly known as: NEURONTIN Take 1 capsule (300 mg total) by mouth 3 (three) times daily as needed (neuropathic pain).   oxyCODONE-acetaminophen 5-325 MG tablet Commonly known as: PERCOCET/ROXICET Take 1-2 tablets by mouth every 4 (four) hours as needed for severe pain.   prenatal multivitamin Tabs tablet Take 1 tablet by mouth daily at 12 noon.   triamcinolone lotion 0.1 % Commonly known as: KENALOG Apply 1 application topically 3 (three) times daily.               Discharge Care Instructions  (From admission, onward)           Start     Ordered   03/29/21 0000  If the dressing is still on your incision site when you go home, remove it on the third day after your surgery date. Remove dressing if it begins to fall off, or if it is dirty or damaged before the third day.        03/29/21 0933             Discharge home in stable condition Infant Feeding: Breast Infant Disposition:home with mother Discharge instruction: per After Visit Summary and Postpartum  booklet. Activity: Advance as tolerated. Pelvic rest for 6 weeks.  Diet: routine diet Anticipated Birth Control: Unsure Postpartum Appointment: 4-6 weeks Additional Postpartum F/U: Incision check 1 week Future Appointments:No future appointments. Follow up Visit:  Follow-up Information     Waymon Amato, MD. Schedule an appointment as soon as possible for a visit in 6 week(s).   Specialty: Obstetrics and Gynecology Why: 6 week postpartum follow up. Contact information: [place]  [address] [place] [address] [phone]                     03/29/2021 Sanjuana Kava, MD

## 2021-03-30 ENCOUNTER — Encounter (HOSPITAL_COMMUNITY): Payer: Self-pay | Admitting: Obstetrics & Gynecology

## 2021-03-30 LAB — SURGICAL PATHOLOGY

## 2021-04-05 ENCOUNTER — Inpatient Hospital Stay (HOSPITAL_COMMUNITY): Payer: Medicaid Other

## 2021-04-05 ENCOUNTER — Inpatient Hospital Stay (HOSPITAL_COMMUNITY)
Admission: AD | Admit: 2021-04-05 | Payer: Medicaid Other | Source: Home / Self Care | Admitting: Obstetrics and Gynecology

## 2021-04-07 ENCOUNTER — Telehealth (HOSPITAL_COMMUNITY): Payer: Self-pay | Admitting: *Deleted

## 2021-04-07 NOTE — Telephone Encounter (Signed)
Patient voiced no questions or concerns at this time. EPDS=1. Patient voiced no questions or concerns regarding infant at this time. Patient reports infant sleeps in a bassinet on her back. RN reviewed ABCs of safe sleep. Patient verbalized understanding. Patient requested RN email information on hospital's virtual postpartum classes and support groups. Email sent. Deforest Hoyles, RN, 04/07/21, [phone]

## 2021-09-14 ENCOUNTER — Other Ambulatory Visit: Payer: Self-pay | Admitting: Nurse Practitioner

## 2021-09-14 ENCOUNTER — Ambulatory Visit
Admission: RE | Admit: 2021-09-14 | Discharge: 2021-09-14 | Disposition: A | Discharge status: Home / Self Care | Payer: Medicaid Other | Source: Ambulatory Visit | Attending: Nurse Practitioner | Admitting: Nurse Practitioner

## 2021-09-14 DIAGNOSIS — M25531 Pain in right wrist: Secondary | ICD-10-CM

## 2021-10-27 ENCOUNTER — Inpatient Hospital Stay (HOSPITAL_COMMUNITY): Payer: Medicaid Other

## 2021-10-27 ENCOUNTER — Inpatient Hospital Stay (HOSPITAL_COMMUNITY)
Admission: AD | Admit: 2021-10-27 | Discharge: 2021-10-27 | Disposition: A | Discharge status: Home / Self Care | Payer: Medicaid Other | Attending: Obstetrics and Gynecology | Admitting: Obstetrics and Gynecology

## 2021-10-27 ENCOUNTER — Other Ambulatory Visit: Payer: Self-pay

## 2021-10-27 ENCOUNTER — Other Ambulatory Visit: Payer: Self-pay | Admitting: Obstetrics & Gynecology

## 2021-10-27 ENCOUNTER — Encounter (HOSPITAL_COMMUNITY): Payer: Self-pay | Admitting: Obstetrics and Gynecology

## 2021-10-27 DIAGNOSIS — O0289 Other abnormal products of conception: Secondary | ICD-10-CM | POA: Diagnosis not present

## 2021-10-27 DIAGNOSIS — O200 Threatened abortion: Secondary | ICD-10-CM | POA: Diagnosis not present

## 2021-10-27 DIAGNOSIS — Z3A09 9 weeks gestation of pregnancy: Secondary | ICD-10-CM

## 2021-10-27 DIAGNOSIS — O209 Hemorrhage in early pregnancy, unspecified: Secondary | ICD-10-CM

## 2021-10-27 LAB — CBC
HCT: 34.9 % — ABNORMAL LOW (ref 36.0–46.0)
Hemoglobin: 12.1 g/dL (ref 12.0–15.0)
MCH: 30 pg (ref 26.0–34.0)
MCHC: 34.7 g/dL (ref 30.0–36.0)
MCV: 86.6 fL (ref 80.0–100.0)
Platelets: 349 10*3/uL (ref 150–400)
RBC: 4.03 MIL/uL (ref 3.87–5.11)
RDW: 13.6 % (ref 11.5–15.5)
WBC: 5.5 10*3/uL (ref 4.0–10.5)
nRBC: 0 % (ref 0.0–0.2)

## 2021-10-27 LAB — URINALYSIS, ROUTINE W REFLEX MICROSCOPIC
Bilirubin Urine: NEGATIVE
Glucose, UA: NEGATIVE mg/dL
Ketones, ur: NEGATIVE mg/dL
Leukocytes,Ua: NEGATIVE
Nitrite: NEGATIVE
Protein, ur: NEGATIVE mg/dL
RBC / HPF: >50 RBC/hpf — ABNORMAL HIGH (ref 0–5)
Specific Gravity, Urine: 1.027 (ref 1.005–1.030)
pH: 5 (ref 5.0–8.0)

## 2021-10-27 LAB — HCG, QUANTITATIVE, PREGNANCY: hCG, Beta Chain, Quant, S: 3961 m[IU]/mL — ABNORMAL HIGH (ref <5)

## 2021-10-27 LAB — POCT PREGNANCY, URINE: Preg Test, Ur: POSITIVE — AB

## 2021-10-27 MED ORDER — ACETAMINOPHEN-CODEINE 300-30 MG PO TABS
1.0000 | ORAL_TABLET | Freq: Once | ORAL | Status: AC
Start: 2021-10-27 — End: 2021-10-27
  Administered 2021-10-27: 1 via ORAL

## 2021-10-27 MED ORDER — MISOPROSTOL 200 MCG PO TABS
800.0000 ug | ORAL_TABLET | Freq: Once | ORAL | Status: AC
  Administered 2021-10-27: 800 ug via RECTAL
  Filled 2021-10-27: qty 4

## 2021-10-27 MED ORDER — PROMETHAZINE HCL 25 MG PO TABS
12.5000 mg | ORAL_TABLET | Freq: Once | ORAL | Status: AC
  Administered 2021-10-27: 12.5 mg via ORAL
  Filled 2021-10-27: qty 1

## 2021-10-27 NOTE — MAU Provider Note (Addendum)
Chief Complaint:  Threatened Miscarriage and Vaginal Bleeding   Event Date/Time   First Provider Initiated Contact with Patient 10/27/21 0801     HPI: April Crane is a 31 y.o. G2P1001 at [redacted]w[redacted]d who presents to maternity admissions reporting new onset vaginal bleeding this morning. LMP in mid April, +UPT at [facility] on 09/24/21, has not started prenatal care yet. Woke up this morning to a large dark red spot in the bed and blood in the toilet when she went void. Has continued light bleeding with mild cramping. No other physical complaints. Her mother is present and contributing to the history taking.   Pregnancy Course: Daughter is 22mo, she is still breastfeeding and having regular periods for the 67mo prior to pregnancy.   Past Medical History:  Diagnosis Date   Asthma    Headache    No pertinent past medical history    Obstruction of colon (HCC)    Renal disorder    OB History  Gravida Para Term Preterm AB Living  2 1 1     1   SAB IAB Ectopic Multiple Live Births        0 1    # Outcome Date GA Lbr Len/2nd Weight Sex Delivery Anes PTL Lv  2 Current           1 Term 03/26/21 [redacted]w[redacted]d  2977 g F CS-LTranv EPI  LIV   Past Surgical History:  Procedure Laterality Date   ANTERIOR CRUCIATE LIGAMENT REPAIR     ANTERIOR CRUCIATE LIGAMENT REPAIR Left    CESAREAN SECTION N/A 03/26/2021   Procedure: CESAREAN SECTION;  Surgeon: 03/28/2021, MD;  Location: [facility] LD ORS;  Service: Obstetrics;  Laterality: N/A;   Family History  Problem Relation Age of Onset   Asthma Sister    Cancer Maternal Grandmother    Allergic rhinitis Neg Hx    Angioedema Neg Hx    Atopy Neg Hx    Eczema Neg Hx    Immunodeficiency Neg Hx    Urticaria Neg Hx    Social History   Tobacco Use   Smoking status: Never   Smokeless tobacco: Never  Vaping Use   Vaping Use: Never used  Substance Use Topics   Alcohol use: No   Drug use: No   Allergies  Allergen Reactions   Flagyl [Metronidazole] Anaphylaxis    Tetracyclines & Related Anaphylaxis   Diflucan [Fluconazole] Swelling   Ibuprofen Other (See Comments)    Damaged kidneys   Other Nausea And Vomiting    Antibiotic (patient cannot recall name/elementary school)   Medications Prior to Admission  Medication Sig Dispense Refill Last Dose   Prenatal Vit-Fe Fumarate-FA (PRENATAL MULTIVITAMIN) TABS tablet Take 1 tablet by mouth daily at 12 noon.   10/26/2021   acetaminophen (TYLENOL) 500 MG tablet Take 2 tablets (1,000 mg total) by mouth every 6 (six) hours. 30 tablet 0    albuterol (VENTOLIN HFA) 108 (90 Base) MCG/ACT inhaler Inhale 2 puffs into the lungs every 4 (four) hours as needed for wheezing or shortness of breath. 18 g 0 More than a month   cyclobenzaprine (FLEXERIL) 5 MG tablet Take 1 tablet (5 mg total) by mouth 3 (three) times daily as needed (cramps). 30 tablet 0 More than a month   ferrous sulfate 325 (65 FE) MG tablet Take 325 mg by mouth daily with breakfast.   More than a month   gabapentin (NEURONTIN) 300 MG capsule Take 1 capsule (300 mg total) by mouth 3 (  three) times daily as needed (neuropathic pain). 30 capsule 3 More than a month   oxyCODONE-acetaminophen (PERCOCET/ROXICET) 5-325 MG tablet Take 1-2 tablets by mouth every 4 (four) hours as needed for severe pain. 30 tablet 0 More than a month   triamcinolone lotion (KENALOG) 0.1 % Apply 1 application topically 3 (three) times daily.   More than a month   I have reviewed patient's Past Medical Hx, Surgical Hx, Family Hx, Social Hx, medications and allergies.   ROS:  Pertinent items noted in HPI and remainder of comprehensive ROS otherwise negative.   Physical Exam  Patient Vitals for the past 24 hrs:  BP Temp Pulse Resp Height Weight  10/27/21 0537 117/67 98.2 F (36.8 C) 82 18 5\' 1"  (1.549 m) 84.8 kg   Constitutional: Well-developed, well-nourished female in no acute distress.  Cardiovascular: normal rate & rhythm Respiratory: normal effort GI: Abd soft,  non-tender MS: Extremities nontender, no edema, normal ROM Neurologic: Alert and oriented x 4.  GU: no CVA tenderness Pelvic: exam deferred, sent to U/S   Labs: Results for orders placed or performed during the hospital encounter of 10/27/21 (from the past 24 hour(s))  Urinalysis, Routine w reflex microscopic Urine, Clean Catch     Status: Abnormal   Collection Time: 10/27/21  5:27 AM  Result Value Ref Range   Color, Urine YELLOW YELLOW   APPearance HAZY (A) CLEAR   Specific Gravity, Urine 1.027 1.005 - 1.030   pH 5.0 5.0 - 8.0   Glucose, UA NEGATIVE NEGATIVE mg/dL   Hgb urine dipstick LARGE (A) NEGATIVE   Bilirubin Urine NEGATIVE NEGATIVE   Ketones, ur NEGATIVE NEGATIVE mg/dL   Protein, ur NEGATIVE NEGATIVE mg/dL   Nitrite NEGATIVE NEGATIVE   Leukocytes,Ua NEGATIVE NEGATIVE   RBC / HPF >50 (H) 0 - 5 RBC/hpf   WBC, UA 6-10 0 - 5 WBC/hpf   Bacteria, UA RARE (A) NONE SEEN   Squamous Epithelial / LPF 11-20 0 - 5   Mucus PRESENT   Pregnancy, urine POC     Status: Abnormal   Collection Time: 10/27/21  5:28 AM  Result Value Ref Range   Preg Test, Ur POSITIVE (A) NEGATIVE  hCG, quantitative, pregnancy     Status: Abnormal   Collection Time: 10/27/21  6:38 AM  Result Value Ref Range   hCG, Beta Chain, Quant, S 3,961 (H) <5 mIU/mL  CBC     Status: Abnormal   Collection Time: 10/27/21  6:38 AM  Result Value Ref Range   WBC 5.5 4.0 - 10.5 K/uL   RBC 4.03 3.87 - 5.11 MIL/uL   Hemoglobin 12.1 12.0 - 15.0 g/dL   HCT 34.9 (L) 36.0 - 46.0 %   MCV 86.6 80.0 - 100.0 fL   MCH 30.0 26.0 - 34.0 pg   MCHC 34.7 30.0 - 36.0 g/dL   RDW 13.6 11.5 - 15.5 %   Platelets 349 150 - 400 K/uL   nRBC 0.0 0.0 - 0.2 %   Imaging:  US OB LESS THAN 14 WEEKS WITH OB TRANSVAGINAL  Result Date: 10/27/2021 CLINICAL DATA:  Vaginal bleeding.  Pregnant. EXAM: OBSTETRIC <14 WK [place] AND TRANSVAGINAL OB US TECHNIQUE: Both transabdominal and transvaginal ultrasound examinations were performed for complete evaluation  of the gestation as well as the maternal uterus, adnexal regions, and pelvic cul-de-sac. Transvaginal technique was performed to assess early pregnancy. COMPARISON:  None Available. FINDINGS: Intrauterine gestational sac: Present Yolk sac:  None Embryo:  Present Cardiac Activity: None Heart Rate:  N/A bpm CRL:  12.4 mm   7 w   3 d                  [place] EDC: 06/12/2022 Subchorionic hemorrhage:  None visualized. Maternal uterus/adnexae: Both ovaries are normal. IMPRESSION: Intrauterine gestational sac and embryo estimated at 7 weeks and 3 days gestation but no cardiac activity consistent with nonviable embryo and impending spontaneous abortion. Normal ovaries. Electronically Signed   By: Rudie Meyer M.D.   On: 10/27/2021 08:23    MAU Course: Orders Placed This Encounter  Procedures   US OB LESS THAN 14 WEEKS WITH OB TRANSVAGINAL   Urinalysis, Routine w reflex microscopic Urine, Clean Catch   hCG, quantitative, pregnancy   CBC   Pregnancy, urine POC   No orders of the defined types were placed in this encounter.  MDM: Labs and ultrasound ordered from triage, medical screening exam completed.  Care turned over to [facility], CNM Edd Arbour, CNM, MSN, [employer] Certified Nurse Midwife, [facility]  Early Intrauterine Pregnancy Failure Protocol X  Documented intrauterine pregnancy failure less than or equal to [redacted] weeks   gestation - measures 7.3 wks X  No serious current illness  X  Baseline Hgb greater than or equal to 10g/dl  X  Patient has easily accessible transportation to the hospital  X  Clear preference  X  Practitioner/physician deems patient reliable  X  Counseling by practitioner or physician  X  Patient education by RN  X  Consent form signed       Rho-Gam given by RN if indicated  X  Medication dispensed  _  Cytotec 800 mcg Intravaginally by patient at home       Intravaginally by NP in MAU       Rectally by patient at home   X   Rectally by RN in MAU  X    Ibuprofen 600 mg 1 tablet by mouth every 6 hours as needed #30 - prescribed  X   Tylenol #3 mg by mouth every 4 to 6 hours as needed - prescribed  X   Phenergan 12.5 mg by mouth every 4 hours as needed for nausea - prescribed  Reviewed with pt cytotec procedure.  Pt verbalizes that she lives close to the hospital and has transportation readily available.  Pt appears reliable and verbalizes understanding and agrees with plan of care.  Reassessment @ 1100: Patient had question about the effects of Cytotec on breast milk. After researching on [employer], patient informed that Cytotec deposits in very small amounts in the breast milk and she should observe her nursing infant for vomiting and diarrhea. If those sx's occur and are worrisome to her, she should contact the child's pediatrician and let them know that she received a dose of Cytotec.   Assessment: 1. Nonviable pregnancy   2. Threatened miscarriage in early pregnancy   3. Vaginal bleeding in pregnancy, first trimester   4. [redacted] weeks gestation of pregnancy     Plan: Nonviable pregnancy - Explained to patient that pregnancy appears to have stopped growing 2 weeks ago - Discussed options of expectant management and Cytotec -- after privately discussing with her mother the patient chose Cytotec mgm   Threatened miscarriage in early pregnancy - Information provided on threatened miscarriage   Vaginal bleeding in pregnancy, first trimester - Return to MAU: If you have heavier bleeding that soaks through more that 2 pads per hour for an hour or more  If you bleed so much that you feel like you might pass out or you do pass out If you have significant abdominal pain that is not improved with Tylenol 1000 mg every 8 hours as needed for pain If you develop a fever > 100.5   [redacted] weeks gestation of pregnancy    - Discharge home  - Note to be OOW until Friday 10/30/21 - Patient verbalized an understanding of the plan of care and agrees.    Laury Deep, CNM 10/27/2021 11:26 AM

## 2021-10-27 NOTE — MAU Note (Signed)
.  April Crane is a 31 y.o. at Unknown here in MAU reporting: had a positive pregnancy test at health dept in May. (No prenatal care as of yet). Reports she woke this morning and was in a "pool of blood on her bed. Continues to have some bleeding and light cramping.  LMP: 08/21/2021 Onset of complaint: 330 Pain score: 4 Vitals:   10/27/21 0537  BP: 117/67  Pulse: 82  Resp: 18  Temp: 98.2 F (36.8 C)     FHT: Lab orders placed from triage:  ua

## 2021-10-27 NOTE — Discharge Instructions (Addendum)
Return to MAU: If you have heavier bleeding that soaks through more that 2 pads per hour for an hour or more If you bleed so much that you feel like you might pass out or you do pass out If you have significant abdominal pain that is not improved with Tylenol #3 every 8 hours as needed for pain If you develop a fever > 100.5   **You need to follow-up with your OB/GYN office in 2 weeks.**

## 2021-10-31 ENCOUNTER — Other Ambulatory Visit: Payer: Self-pay

## 2021-10-31 ENCOUNTER — Inpatient Hospital Stay (HOSPITAL_COMMUNITY)
Admission: AD | Admit: 2021-10-31 | Discharge: 2021-10-31 | Disposition: A | Discharge status: Home / Self Care | Payer: Medicaid Other | Attending: Obstetrics and Gynecology | Admitting: Obstetrics and Gynecology

## 2021-10-31 DIAGNOSIS — O039 Complete or unspecified spontaneous abortion without complication: Secondary | ICD-10-CM | POA: Diagnosis present

## 2021-10-31 NOTE — MAU Note (Signed)
.  April Crane is a 31 y.o. at [redacted]w[redacted]d here in MAU reporting: was here Tues morning.  Given Cytotec.  Was supposed to go to her dr on Monday. Called her office, on call told her to come. Bleeding has slowed. Not having any pain now Pt said she just didn't know what to expect.  Pain score: none Vitals:   10/31/21 1335  BP: 117/78  Pulse: 84  Resp: 17  Temp: 98.1 F (36.7 C)  SpO2: 100%      Lab orders placed from triage:   none

## 2021-10-31 NOTE — MAU Provider Note (Signed)
Event Date/Time   First Provider Initiated Contact with Patient 10/31/21 1414     S Ms. April Crane is a 31 y.o. G2P1001 pregnant female who presents to MAU today with complaint of confirmed SAB on 10/27/21 with cytotec therapy but only continued vaginal spotting and no cramping since then. She is confused about not having increased bleeding/cramping as she expected and wants to know what to do now. States that after her MAU visit, her OB office called and prescribed more misoprostol, instructing her to take it the next day if no onset of bleeding. She did pick these up but has not taken them. No physical complaints other than the continued vaginal spotting.  Receives care at [facility], previous MAU visit reviewed. Was given cytotec rectally but pt states she had a bowel movement within an hour of arriving home.  Pertinent items noted in HPI and remainder of comprehensive ROS otherwise negative.   O BP 117/78 (BP Location: Right Arm)   Pulse 84   Temp 98.1 F (36.7 C) (Oral)   Resp 17   Ht 5\' 1"  (1.549 m)   Wt 188 lb 3.2 oz (85.4 kg)   LMP 08/21/2021   SpO2 100%   BMI 35.56 kg/m  Physical Exam Vitals and nursing note reviewed.  Constitutional:      General: She is not in acute distress.    Appearance: Normal appearance. She is not ill-appearing.  HENT:     Head: Normocephalic.  Eyes:     Pupils: Pupils are equal, round, and reactive to light.  Cardiovascular:     Rate and Rhythm: Normal rate and regular rhythm.  Pulmonary:     Effort: Pulmonary effort is normal.  Musculoskeletal:        General: Normal range of motion.  Skin:    General: Skin is warm and dry.     Capillary Refill: Capillary refill takes less than 2 seconds.  Neurological:     Mental Status: She is alert and oriented to person, place, and time.  Psychiatric:        Mood and Affect: Mood normal.        Behavior: Behavior normal.        Thought Content: Thought content normal.         Judgment: Judgment normal.    Since vitals stable and no emergent complaints, encouraged her to go home and follow her MD orders from [facility]. Advised buccal administration will work best, reviewed bleeding precautions and expected progression of bleeding/clots. Pt (and her sister) expressed understanding. Has follow up at [facility] on Monday, 11/02/21.  A Miscarriage [redacted] weeks gestation (7wk IUP) Medical screening exam complete  P Discharge from MAU in stable condition with bleeding precautions Follow up at [facility] as scheduled for miscarriage follow up  [facility], CNM 10/31/2021 2:22 PM

## 2022-01-26 ENCOUNTER — Emergency Department (HOSPITAL_COMMUNITY)
Admission: EM | Admit: 2022-01-26 | Discharge: 2022-01-27 | Discharge status: Against Medical Advice | Payer: Medicaid Other | Attending: Emergency Medicine | Admitting: Emergency Medicine

## 2022-01-26 ENCOUNTER — Encounter (HOSPITAL_COMMUNITY): Payer: Self-pay | Admitting: Emergency Medicine

## 2022-01-26 ENCOUNTER — Other Ambulatory Visit: Payer: Self-pay

## 2022-01-26 DIAGNOSIS — R519 Headache, unspecified: Secondary | ICD-10-CM | POA: Diagnosis present

## 2022-01-26 DIAGNOSIS — Z5321 Procedure and treatment not carried out due to patient leaving prior to being seen by health care provider: Secondary | ICD-10-CM | POA: Insufficient documentation

## 2022-01-26 MED ORDER — ACETAMINOPHEN 500 MG PO TABS
1000.0000 mg | ORAL_TABLET | Freq: Four times a day (QID) | ORAL | Status: DC | PRN
  Administered 2022-01-26: 1000 mg via ORAL
  Filled 2022-01-26: qty 2

## 2022-01-26 NOTE — ED Notes (Signed)
Called for VS and no response

## 2022-01-26 NOTE — ED Notes (Signed)
NA x4 

## 2022-01-26 NOTE — ED Provider Triage Note (Signed)
Emergency Medicine Provider Triage Evaluation Note  April Crane , a 31 y.o. female  was evaluated in triage.  Pt complains of headache. 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  Headache that feels very similar to ones she has had before. H/o migraines. Has taken no medications for symptoms. Pain is all over head. No fever, nausea, vomiting, diarrhea. No visual disturbance. Denies phonophobia and photophobia. Denies trauma. Not on blood thinners.    Review of Systems  Positive: See above Negative: See above  Physical Exam  BP 117/82 (BP Location: Right Arm)   Pulse 89   Temp 98.7 F (37.1 C) (Oral)   Resp 18   Ht 5\' 2"  (1.575 m)   Wt 83.5 kg   LMP 01/20/2022   SpO2 100%   Breastfeeding Unknown   BMI 33.65 kg/m  Gen:   Awake, no distress   Resp:  Normal effort  MSK:   Moves extremities without difficulty  Other:  No FND on neuro exam  Medical Decision Making  Medically screening exam initiated at 8:24 PM.  Appropriate orders placed.  April Crane was informed that the remainder of the evaluation will be completed by another provider, this initial triage assessment does not replace that evaluation, and the importance of remaining in the ED until their evaluation is complete.  Tylenol. Offered ibuprofen but patient stated "it messed up her kidneys"   Harriet Pho, PA-C 01/26/22 2033

## 2022-01-26 NOTE — ED Triage Notes (Signed)
Patient arrives ambulatory by POV c/o migraine onset of today. Reports hx of migraines.

## 2022-01-26 NOTE — ED Notes (Signed)
Called x3 for VS and no response

## 2022-03-01 ENCOUNTER — Emergency Department (HOSPITAL_COMMUNITY): Payer: Medicaid Other

## 2022-03-01 ENCOUNTER — Other Ambulatory Visit: Payer: Self-pay

## 2022-03-01 ENCOUNTER — Encounter (HOSPITAL_COMMUNITY): Payer: Self-pay

## 2022-03-01 ENCOUNTER — Inpatient Hospital Stay (HOSPITAL_COMMUNITY)
Admission: EM | Admit: 2022-03-01 | Discharge: 2022-03-03 | DRG: 871 | Disposition: A | Discharge status: Home / Self Care | Payer: Medicaid Other | Attending: Family Medicine | Admitting: Family Medicine

## 2022-03-01 DIAGNOSIS — N61 Mastitis without abscess: Secondary | ICD-10-CM | POA: Diagnosis present

## 2022-03-01 DIAGNOSIS — H7090 Unspecified mastoiditis, unspecified ear: Secondary | ICD-10-CM | POA: Diagnosis present

## 2022-03-01 DIAGNOSIS — Z888 Allergy status to other drugs, medicaments and biological substances status: Secondary | ICD-10-CM

## 2022-03-01 DIAGNOSIS — Z8616 Personal history of COVID-19: Secondary | ICD-10-CM

## 2022-03-01 DIAGNOSIS — E669 Obesity, unspecified: Secondary | ICD-10-CM | POA: Diagnosis present

## 2022-03-01 DIAGNOSIS — J45909 Unspecified asthma, uncomplicated: Secondary | ICD-10-CM | POA: Diagnosis present

## 2022-03-01 DIAGNOSIS — D649 Anemia, unspecified: Secondary | ICD-10-CM | POA: Diagnosis present

## 2022-03-01 DIAGNOSIS — Z825 Family history of asthma and other chronic lower respiratory diseases: Secondary | ICD-10-CM

## 2022-03-01 DIAGNOSIS — I959 Hypotension, unspecified: Secondary | ICD-10-CM | POA: Diagnosis not present

## 2022-03-01 DIAGNOSIS — M542 Cervicalgia: Secondary | ICD-10-CM | POA: Diagnosis present

## 2022-03-01 DIAGNOSIS — Z881 Allergy status to other antibiotic agents status: Secondary | ICD-10-CM

## 2022-03-01 DIAGNOSIS — B9789 Other viral agents as the cause of diseases classified elsewhere: Secondary | ICD-10-CM | POA: Diagnosis present

## 2022-03-01 DIAGNOSIS — A4189 Other specified sepsis: Principal | ICD-10-CM | POA: Diagnosis present

## 2022-03-01 DIAGNOSIS — Z6833 Body mass index (BMI) 33.0-33.9, adult: Secondary | ICD-10-CM

## 2022-03-01 DIAGNOSIS — J189 Pneumonia, unspecified organism: Secondary | ICD-10-CM | POA: Diagnosis present

## 2022-03-01 DIAGNOSIS — A419 Sepsis, unspecified organism: Secondary | ICD-10-CM | POA: Diagnosis not present

## 2022-03-01 DIAGNOSIS — O9123 Nonpurulent mastitis associated with lactation: Secondary | ICD-10-CM | POA: Diagnosis present

## 2022-03-01 DIAGNOSIS — Z79899 Other long term (current) drug therapy: Secondary | ICD-10-CM

## 2022-03-01 LAB — COMPREHENSIVE METABOLIC PANEL
ALT: 14 U/L (ref 0–44)
AST: 18 U/L (ref 15–41)
Albumin: 3.6 g/dL (ref 3.5–5.0)
Alkaline Phosphatase: 54 U/L (ref 38–126)
Anion gap: 17 — ABNORMAL HIGH (ref 5–15)
BUN: 12 mg/dL (ref 6–20)
CO2: 22 mmol/L (ref 22–32)
Calcium: 8.7 mg/dL — ABNORMAL LOW (ref 8.9–10.3)
Chloride: 99 mmol/L (ref 98–111)
Creatinine, Ser: 0.96 mg/dL (ref 0.44–1.00)
GFR, Estimated: >60 mL/min (ref >60)
Glucose, Bld: 70 mg/dL (ref 70–99)
Potassium: 3.5 mmol/L (ref 3.5–5.1)
Sodium: 138 mmol/L (ref 135–145)
Total Bilirubin: 2 mg/dL — ABNORMAL HIGH (ref 0.3–1.2)
Total Protein: 7.4 g/dL (ref 6.5–8.1)

## 2022-03-01 LAB — URINALYSIS, ROUTINE W REFLEX MICROSCOPIC
Bilirubin Urine: NEGATIVE
Glucose, UA: NEGATIVE mg/dL
Hgb urine dipstick: NEGATIVE
Ketones, ur: NEGATIVE mg/dL
Leukocytes,Ua: NEGATIVE
Nitrite: NEGATIVE
Protein, ur: NEGATIVE mg/dL
Specific Gravity, Urine: 1.015 (ref 1.005–1.030)
pH: 5 (ref 5.0–8.0)

## 2022-03-01 LAB — RESP PANEL BY RT-PCR (FLU A&B, COVID) ARPGX2
Influenza A by PCR: NEGATIVE
Influenza B by PCR: NEGATIVE
SARS Coronavirus 2 by RT PCR: NEGATIVE

## 2022-03-01 LAB — CBC WITH DIFFERENTIAL/PLATELET
Abs Immature Granulocytes: 0.15 10*3/uL — ABNORMAL HIGH (ref 0.00–0.07)
Basophils Absolute: 0 10*3/uL (ref 0.0–0.1)
Basophils Relative: 0 %
Eosinophils Absolute: 0 10*3/uL (ref 0.0–0.5)
Eosinophils Relative: 0 %
HCT: 36.8 % (ref 36.0–46.0)
Hemoglobin: 12.4 g/dL (ref 12.0–15.0)
Immature Granulocytes: 1 %
Lymphocytes Relative: 4 %
Lymphs Abs: 0.9 10*3/uL (ref 0.7–4.0)
MCH: 29.1 pg (ref 26.0–34.0)
MCHC: 33.7 g/dL (ref 30.0–36.0)
MCV: 86.4 fL (ref 80.0–100.0)
Monocytes Absolute: 0.9 10*3/uL (ref 0.1–1.0)
Monocytes Relative: 4 %
Neutro Abs: 22.2 10*3/uL — ABNORMAL HIGH (ref 1.7–7.7)
Neutrophils Relative %: 91 %
Platelets: 297 10*3/uL (ref 150–400)
RBC: 4.26 MIL/uL (ref 3.87–5.11)
RDW: 13.9 % (ref 11.5–15.5)
WBC: 24.2 10*3/uL — ABNORMAL HIGH (ref 4.0–10.5)
nRBC: 0 % (ref 0.0–0.2)

## 2022-03-01 LAB — LACTIC ACID, PLASMA
Lactic Acid, Venous: 1.9 mmol/L (ref 0.5–1.9)
Lactic Acid, Venous: 1.8 mmol/L (ref 0.5–1.9)

## 2022-03-01 LAB — PROTIME-INR
INR: 1.4 — ABNORMAL HIGH (ref 0.8–1.2)
Prothrombin Time: 16.6 seconds — ABNORMAL HIGH (ref 11.4–15.2)

## 2022-03-01 LAB — I-STAT BETA HCG BLOOD, ED (MC, WL, AP ONLY): I-stat hCG, quantitative: <5 m[IU]/mL (ref <5)

## 2022-03-01 LAB — APTT: aPTT: 34 seconds (ref 24–36)

## 2022-03-01 MED ORDER — SODIUM CHLORIDE 0.9 % IV SOLN
2.0000 g | Freq: Once | INTRAVENOUS | Status: AC
  Administered 2022-03-01: 2 g via INTRAVENOUS
  Filled 2022-03-01: qty 20

## 2022-03-01 MED ORDER — VANCOMYCIN HCL IN DEXTROSE 1-5 GM/200ML-% IV SOLN: 1000.0000 mg | Freq: Once | INTRAVENOUS | Status: DC

## 2022-03-01 MED ORDER — LACTATED RINGERS IV SOLN: INTRAVENOUS | Status: AC

## 2022-03-01 MED ORDER — LACTATED RINGERS IV BOLUS (SEPSIS)
1000.0000 mL | Freq: Once | INTRAVENOUS | Status: AC
  Administered 2022-03-01: 1000 mL via INTRAVENOUS

## 2022-03-01 MED ORDER — VANCOMYCIN HCL 1500 MG/300ML IV SOLN
1500.0000 mg | Freq: Once | INTRAVENOUS | Status: AC
  Administered 2022-03-01: 1500 mg via INTRAVENOUS
  Filled 2022-03-01: qty 300

## 2022-03-01 MED ORDER — ACETAMINOPHEN 500 MG PO TABS: 1000.0000 mg | ORAL_TABLET | Freq: Once | ORAL | Status: AC

## 2022-03-01 MED ORDER — SODIUM CHLORIDE 0.9 % IV SOLN
500.0000 mg | Freq: Once | INTRAVENOUS | Status: AC
  Administered 2022-03-01: 500 mg via INTRAVENOUS
  Filled 2022-03-01: qty 5

## 2022-03-01 MED ORDER — LACTATED RINGERS IV BOLUS
1000.0000 mL | Freq: Once | INTRAVENOUS | Status: AC
  Administered 2022-03-02: 1000 mL via INTRAVENOUS

## 2022-03-01 MED ORDER — IOHEXOL 350 MG/ML SOLN
60.0000 mL | Freq: Once | INTRAVENOUS | Status: AC | PRN
  Administered 2022-03-01: 60 mL via INTRAVENOUS

## 2022-03-01 MED ORDER — ACETAMINOPHEN 500 MG PO TABS
ORAL_TABLET | ORAL | Status: AC
  Administered 2022-03-01: 1000 mg via ORAL
  Filled 2022-03-01: qty 2

## 2022-03-01 MED ORDER — VANCOMYCIN HCL 1250 MG/250ML IV SOLN
1250.0000 mg | INTRAVENOUS | Status: DC
  Filled 2022-03-01: qty 250

## 2022-03-01 MED ORDER — MORPHINE SULFATE (PF) 4 MG/ML IV SOLN
4.0000 mg | Freq: Once | INTRAVENOUS | Status: AC
  Administered 2022-03-01: 4 mg via INTRAVENOUS
  Filled 2022-03-01: qty 1

## 2022-03-01 NOTE — ED Triage Notes (Signed)
Reports chest pain, left breast pain, headache and dizziness that started around 0200. Patient tachy and febrile in triage.  Reports also having a coughl.

## 2022-03-01 NOTE — Progress Notes (Signed)
Pharmacy Antibiotic Note  April Crane is a 31 y.o. female admitted on 03/01/2022 presenting with HA, dizziness and febrile, concern for pna.  Pharmacy has been consulted for vancomycin dosing.  Gram negative coverage per MD  Plan: Vancomycin 1500 mg IV x 1, then 1250 mg IV q24h (eAUC 497) Add MRSA PCR Monitor renal function, Cx/PCR to narrow Vancomycin levels as needed  Height: 5\' 2"  (157.5 cm) Weight: 83.5 kg (184 lb) IBW/kg (Calculated) : 50.1  Temp (24hrs), Avg:101.1 F (38.4 C), Min:98.8 F (37.1 C), Max:103.3 F (39.6 C)  Recent Labs  Lab 03/01/22 1720  WBC 24.2*  CREATININE 0.96  LATICACIDVEN 1.9    Estimated Creatinine Clearance: 85.1 mL/min (by C-G formula based on SCr of 0.96 mg/dL).    Allergies  Allergen Reactions   Flagyl [Metronidazole] Anaphylaxis   Tetracycline Anaphylaxis    Other reaction(s): anaphylaxis, Anaphylaxis   Tetracyclines & Related Anaphylaxis   Diflucan [Fluconazole] Swelling   Ibuprofen Other (See Comments)    Damaged kidneys   Other Nausea And Vomiting    Antibiotic (patient cannot recall name/elementary school)    Bertis Ruddy, PharmD Clinical Pharmacist ED Pharmacist Phone # [phone] 03/01/2022 8:16 PM

## 2022-03-01 NOTE — ED Provider Notes (Signed)
[facility] EMERGENCY DEPARTMENT Provider Note   CSN: [id number] Arrival date & time: 03/01/22  1638     History  Chief Complaint  Patient presents with   Cough    KENDLE TURBIN is a 31 y.o. female with presenting due to headache.  Reports that she woke up this morning around 2 AM with a cough.  Says that it was productive but she never got it out of her throat to see what it looks like.  Said throughout the day she started to develop headache and some chest pain.  Began to feel very weak and lightheaded.  Says that it feels somewhat similar to when she had COVID previously but is much worse.  No history of DVT/PE, estrogen use, tobacco use, recent travel or surgery.   Cough Associated symptoms: headaches        Home Medications Prior to Admission medications   Medication Sig Start Date End Date Taking? Authorizing Provider  albuterol (VENTOLIN HFA) 108 (90 Base) MCG/ACT inhaler Inhale 2 puffs into the lungs every 4 (four) hours as needed for wheezing or shortness of breath. 12/24/19   Horton, Barbette Hair, MD  ferrous sulfate 325 (65 FE) MG tablet Take 325 mg by mouth daily with breakfast.    [provider]  triamcinolone lotion (KENALOG) 0.1 % Apply 1 application topically 3 (three) times daily.    [provider]      Allergies    Flagyl [metronidazole], Tetracycline, Tetracyclines & related, Diflucan [fluconazole], Ibuprofen, and Other    Review of Systems   Review of Systems  Constitutional:  Positive for fatigue.  Respiratory:  Positive for cough.   Gastrointestinal:  Negative for diarrhea, nausea and vomiting.  Genitourinary:  Negative for dysuria, pelvic pain and vaginal discharge.  Neurological:  Positive for dizziness, weakness and headaches.  Psychiatric/Behavioral:  Negative for confusion.     Physical Exam Updated Vital Signs BP (!) 92/56   Pulse (!) 121   Temp (!) 103.3 F (39.6 C) (Oral)   Resp (!) 25   Ht 5' 2"  (1.575 m)   Wt 83.5 kg   LMP 08/21/2021   SpO2 99%   BMI 33.65 kg/m  Physical Exam Vitals and nursing note reviewed.  Constitutional:      Appearance: Normal appearance. She is not ill-appearing.  HENT:     Head: Normocephalic and atraumatic.     Nose: Nose normal.     Mouth/Throat:     Mouth: Mucous membranes are dry.     Pharynx: Posterior oropharyngeal erythema present. No oropharyngeal exudate.  Eyes:     General: No scleral icterus.    Conjunctiva/sclera: Conjunctivae normal.  Cardiovascular:     Rate and Rhythm: Regular rhythm. Tachycardia present.     Heart sounds: No murmur heard. Pulmonary:     Effort: Pulmonary effort is normal. No respiratory distress.     Breath sounds: No wheezing, rhonchi or rales.     Comments: Mildly decreased breath sounds in the left lower lung field Chest:     Chest wall: Tenderness (Tenderness over the left anterior chest wall) present.  Abdominal:     General: Abdomen is flat.     Palpations: Abdomen is soft.     Tenderness: There is no abdominal tenderness.  Skin:    General: Skin is warm and dry.     Findings: No rash.  Neurological:     General: No focal deficit present.     Mental Status:  She is alert.     Comments: Moving all extremities, 5 out of 5 strength in bilateral upper and lower.  No problem with EOMs, PERRLA.  Some reported photophobia.  Psychiatric:        Mood and Affect: Mood normal.     ED Results / Procedures / Treatments   Labs (all labs ordered are listed, but only abnormal results are displayed) Labs Reviewed  RESP PANEL BY RT-PCR (FLU A&B, COVID) ARPGX2  CULTURE, BLOOD (ROUTINE X 2)  CULTURE, BLOOD (ROUTINE X 2)  URINE CULTURE  MRSA NEXT GEN BY PCR, NASAL  COMPREHENSIVE METABOLIC PANEL  LACTIC ACID, PLASMA  LACTIC ACID, PLASMA  CBC WITH DIFFERENTIAL/PLATELET  PROTIME-INR  URINALYSIS, ROUTINE W REFLEX MICROSCOPIC  APTT  I-STAT BETA HCG BLOOD, ED ([facility], [facility], [facility] ONLY)    EKG EKG  Interpretation  Date/Time:  Monday March 01 2022 17:00:01 EDT Ventricular Rate:  148 PR Interval:  120 QRS Duration: 66 QT Interval:  268 QTC Calculation: 420 R Axis:   11 Text Interpretation: Sinus tachycardia Otherwise normal ECG When compared with ECG of 14-Jul-2015 15:14, PREVIOUS ECG IS PRESENT when compared to prior, faster rate. No STEMI Confirmed by Tegeler, Chris ([id number]) on 03/01/2022 5:28:50 PM  Radiology No results found.  Procedures .Critical Care  Performed by: Redwine, Madison A, PA-C Authorized by: Redwine, Madison A, PA-C   Critical care provider statement:    Critical care time (minutes):  75   Critical care time was exclusive of:  Separately billable procedures and treating other patients   Critical care was necessary to treat or prevent imminent or life-threatening deterioration of the following conditions:  Sepsis   Critical care was time spent personally by me on the following activities:  Development of treatment plan with patient or surrogate, discussions with primary provider, evaluation of patient's response to treatment, examination of patient, obtaining history from patient or surrogate, review of old charts, re-evaluation of patient's condition, ordering and review of radiographic studies, ordering and review of laboratory studies and ordering and performing treatments and interventions   I assumed direction of critical care for this patient from another provider in my specialty: no     Care discussed with: admitting provider      Medications Ordered in ED Medications  lactated ringers infusion ( Intravenous New Bag/Given 03/01/22 2002)  vancomycin (VANCOREADY) IVPB 1250 mg/250 mL (has no administration in time range)  acetaminophen (TYLENOL) tablet 1,000 mg (1,000 mg Oral Given 03/01/22 1707)  cefTRIAXone (ROCEPHIN) 2 g in sodium chloride 0.9 % 100 mL IVPB (0 g Intravenous Stopped 03/01/22 1818)  azithromycin (ZITHROMAX) 500 mg in sodium chloride 0.9 %  250 mL IVPB (0 mg Intravenous Stopped 03/01/22 1955)  lactated ringers bolus 1,000 mL (0 mLs Intravenous Stopped 03/01/22 1820)    And  lactated ringers bolus 1,000 mL (0 mLs Intravenous Stopped 03/01/22 1954)    And  lactated ringers bolus 1,000 mL (0 mLs Intravenous Stopped 03/01/22 2041)  vancomycin (VANCOREADY) IVPB 1500 mg/300 mL (0 mg Intravenous Stopped 03/01/22 2102)  morphine (PF) 4 MG/ML injection 4 mg (4 mg Intravenous Given 03/01/22 2105)    ED Course/ Medical Decision Making/ A&P Clinical Course as of 03/01/22 1952  Mon Mar 01, 2022  1821 Temp(!): 103.3 F (39.6 C) [MR]  1821 Pulse Rate(!): 140 [MR]  1821 BP(!): 94/56 [MR]  1850 Lactic Acid, Venous: 1.9 [MR]  1952 WBC(!): 24.2 [MR]    Clinical Course User Index [MR] Redwine, Madison A, PA-C                             Medical Decision Making Amount and/or Complexity of Data Reviewed Labs: ordered. Decision-making details documented in ED Course. Radiology: ordered.  Risk OTC drugs. Prescription drug management. Decision regarding hospitalization.   31 year old female presenting today with headache, productive cough and weakness that started this morning.  When she arrived she was hypotensive, febrile, tachycardic and tachypneic.  Sepsis criteria was met and broad-spectrum antibiotics ordered.  Presumed pneumonia source   Additional history: Per chart review patient has other presentations for nondiagnostic chest pain and cough over the last couple of years   Physical Exam: Pertinent physical exam findings include Tachycardic as high as the 140s Mildly decreased breath sounds in the left lower lung field  Lab Tests: I ordered, and personally interpreted labs.  The pertinent results include: Respiratory swab negative Normal lactic WBC 24.2 Normal UA   Imaging Studies: I ordered and independently visualized and interpreted chest x-ray and I agree with the radiologist that there are no abnormal  findings.  Considered head CT.  Neurologic exam nonfocal   Cardiac Monitoring:  The patient was maintained on a cardiac monitor.  I viewed and interpreted the cardiac monitored which showed an underlying rhythm of: Sinus tach   Medications: I ordered medication including Tylenol, Rocephin, bank, azithromycin and LR. Reevaluation of the patient after these medicines showed that the patient stayed the same.  Given morphine, resolved patient's pain.   MDM/Disposition: This is a 31 year old female who presented with viral symptoms.  Inclusive of a productive cough.  On arrival patient was septic and broad-spectrum antibiotics were ordered for presumed pneumonia source.  X-ray negative for pneumonia.  Her urinalysis was also normal.  At this point I spoke with the patient about other potential sources for sepsis.  She has no abdominal tenderness and has not been having any GI symptoms.  No urinary symptoms either.  Has not been sexually active in 4 months and denies a concern for STD.  She continued to complain of a headache so I considered head CT and LP for further evaluation however patient's symptoms resolved with morphine and fluids.  No meningeal signs on physical exam.  At this time I do not have a clear source of her infection however she will need to be admitted for her undifferentiated sepsis.  Heart rate now in the upper 90s, low 100s.  No longer tachypneic.    I spoke about this patient with Dr. Flossie Buffy who requests CTPE and respiratory panel and repage for admission. Patient signed out to PA-C Quincy Carnes. See her note for results of CT and ultimate admission.     I discussed this case with my attending physician Dr. Sherry Ruffing who cosigned this note including patient's presenting symptoms, physical exam, and planned diagnostics and interventions. Attending physician stated agreement with plan or made changes to plan which were implemented.      Final Clinical Impression(s) / ED Diagnoses Final  diagnoses:  Sepsis, due to unspecified organism, unspecified whether acute organ dysfunction present [facility])    Rx / DC Orders     Rhae Hammock, PA-C 03/01/22 2327    Tegeler, Gwenyth Allegra, MD 03/01/22 [phone]

## 2022-03-01 NOTE — ED Provider Triage Note (Signed)
Emergency Medicine Provider Triage Evaluation Note  April Crane , a 31 y.o. female  was evaluated in triage.  Pt complains of cough since 2 AM this morning.  She has not taken any Tylenol Motrin or any other antipyretic.  She denies any chest pain or difficulty breathing.   She does endorse extreme weakness states that she had difficulty getting out of bed because she felt so tired and weak  Review of Systems  Positive: Fever, cough Negative: Nausea vomiting  Physical Exam  BP (!) 94/56 (BP Location: Left Arm)   Pulse (!) 140   Temp (!) 103.3 F (39.6 C) (Oral)   Resp 18   Ht 5\' 2"  (1.575 m)   Wt 83.5 kg   LMP 08/21/2021   SpO2 98%   BMI 33.65 kg/m  Gen:   Awake, no distress  Resp:  Normal effort  MSK:   Moves extremities without difficulty  Other:  Overall nontoxic-appearing however is hypotensive, tachycardic, febrile.  Medical Decision Making  Medically screening exam initiated at 5:07 PM.  Appropriate orders placed.  Wilfred Lacy was informed that the remainder of the evaluation will be completed by another provider, this initial triage assessment does not replace that evaluation, and the importance of remaining in the ED until their evaluation is complete.  Given patient's symptoms and hypotension will require major care immediately.  Charge nurse is aware. 5:09 PM    Tedd Sias, [place] 03/01/22 1709

## 2022-03-02 DIAGNOSIS — H7090 Unspecified mastoiditis, unspecified ear: Secondary | ICD-10-CM | POA: Diagnosis present

## 2022-03-02 DIAGNOSIS — Z79899 Other long term (current) drug therapy: Secondary | ICD-10-CM | POA: Diagnosis not present

## 2022-03-02 DIAGNOSIS — E669 Obesity, unspecified: Secondary | ICD-10-CM | POA: Diagnosis present

## 2022-03-02 DIAGNOSIS — J189 Pneumonia, unspecified organism: Secondary | ICD-10-CM | POA: Diagnosis present

## 2022-03-02 DIAGNOSIS — J45909 Unspecified asthma, uncomplicated: Secondary | ICD-10-CM | POA: Diagnosis present

## 2022-03-02 DIAGNOSIS — O9123 Nonpurulent mastitis associated with lactation: Secondary | ICD-10-CM | POA: Diagnosis present

## 2022-03-02 DIAGNOSIS — M542 Cervicalgia: Secondary | ICD-10-CM | POA: Diagnosis present

## 2022-03-02 DIAGNOSIS — Z888 Allergy status to other drugs, medicaments and biological substances status: Secondary | ICD-10-CM | POA: Diagnosis not present

## 2022-03-02 DIAGNOSIS — N61 Mastitis without abscess: Secondary | ICD-10-CM | POA: Diagnosis present

## 2022-03-02 DIAGNOSIS — B9789 Other viral agents as the cause of diseases classified elsewhere: Secondary | ICD-10-CM | POA: Diagnosis present

## 2022-03-02 DIAGNOSIS — Z8616 Personal history of COVID-19: Secondary | ICD-10-CM | POA: Diagnosis not present

## 2022-03-02 DIAGNOSIS — Z6833 Body mass index (BMI) 33.0-33.9, adult: Secondary | ICD-10-CM | POA: Diagnosis not present

## 2022-03-02 DIAGNOSIS — A419 Sepsis, unspecified organism: Secondary | ICD-10-CM | POA: Diagnosis not present

## 2022-03-02 DIAGNOSIS — Z825 Family history of asthma and other chronic lower respiratory diseases: Secondary | ICD-10-CM | POA: Diagnosis not present

## 2022-03-02 DIAGNOSIS — Z881 Allergy status to other antibiotic agents status: Secondary | ICD-10-CM | POA: Diagnosis not present

## 2022-03-02 DIAGNOSIS — R6520 Severe sepsis without septic shock: Secondary | ICD-10-CM | POA: Diagnosis not present

## 2022-03-02 DIAGNOSIS — A4189 Other specified sepsis: Secondary | ICD-10-CM | POA: Diagnosis present

## 2022-03-02 DIAGNOSIS — D649 Anemia, unspecified: Secondary | ICD-10-CM | POA: Diagnosis present

## 2022-03-02 DIAGNOSIS — I959 Hypotension, unspecified: Secondary | ICD-10-CM | POA: Diagnosis present

## 2022-03-02 LAB — RESPIRATORY PANEL BY PCR

## 2022-03-02 LAB — MRSA NEXT GEN BY PCR, NASAL: MRSA by PCR Next Gen: DETECTED — AB

## 2022-03-02 LAB — BASIC METABOLIC PANEL
Anion gap: 6 (ref 5–15)
BUN: 8 mg/dL (ref 6–20)
CO2: 22 mmol/L (ref 22–32)
Calcium: 8 mg/dL — ABNORMAL LOW (ref 8.9–10.3)
Chloride: 109 mmol/L (ref 98–111)
Creatinine, Ser: 0.56 mg/dL (ref 0.44–1.00)
GFR, Estimated: >60 mL/min (ref >60)
Glucose, Bld: 97 mg/dL (ref 70–99)
Potassium: 4 mmol/L (ref 3.5–5.1)
Sodium: 137 mmol/L (ref 135–145)

## 2022-03-02 LAB — URINE CULTURE

## 2022-03-02 LAB — STREP PNEUMONIAE URINARY ANTIGEN: Strep Pneumo Urinary Antigen: NEGATIVE

## 2022-03-02 LAB — CBC
HCT: 30 % — ABNORMAL LOW (ref 36.0–46.0)
Hemoglobin: 10 g/dL — ABNORMAL LOW (ref 12.0–15.0)
MCH: 29.3 pg (ref 26.0–34.0)
MCHC: 33.3 g/dL (ref 30.0–36.0)
MCV: 88 fL (ref 80.0–100.0)
Platelets: 221 10*3/uL (ref 150–400)
RBC: 3.41 MIL/uL — ABNORMAL LOW (ref 3.87–5.11)
RDW: 14.2 % (ref 11.5–15.5)
WBC: 18.1 10*3/uL — ABNORMAL HIGH (ref 4.0–10.5)
nRBC: 0 % (ref 0.0–0.2)

## 2022-03-02 LAB — HIV ANTIBODY (ROUTINE TESTING W REFLEX): HIV Screen 4th Generation wRfx: NONREACTIVE

## 2022-03-02 MED ORDER — ENOXAPARIN SODIUM 40 MG/0.4ML IJ SOSY
40.0000 mg | PREFILLED_SYRINGE | Freq: Every day | INTRAMUSCULAR | Status: DC
  Administered 2022-03-02: 40 mg via SUBCUTANEOUS
  Filled 2022-03-02: qty 0.4

## 2022-03-02 MED ORDER — MORPHINE SULFATE (PF) 2 MG/ML IV SOLN: 1.0000 mg | INTRAVENOUS | Status: DC | PRN

## 2022-03-02 MED ORDER — SODIUM CHLORIDE 0.9 % IV SOLN: 500.0000 mg | INTRAVENOUS | Status: DC

## 2022-03-02 MED ORDER — CEFAZOLIN SODIUM-DEXTROSE 2-4 GM/100ML-% IV SOLN
2.0000 g | Freq: Three times a day (TID) | INTRAVENOUS | Status: DC
  Administered 2022-03-02 – 2022-03-03 (×3): 2 g via INTRAVENOUS
  Filled 2022-03-02 (×3): qty 100

## 2022-03-02 MED ORDER — MORPHINE SULFATE (PF) 2 MG/ML IV SOLN
1.0000 mg | INTRAVENOUS | Status: DC | PRN
  Administered 2022-03-02 (×2): 1 mg via INTRAVENOUS
  Filled 2022-03-02 (×2): qty 1

## 2022-03-02 MED ORDER — LACTATED RINGERS IV SOLN: INTRAVENOUS | Status: AC

## 2022-03-02 MED ORDER — ACETAMINOPHEN 325 MG PO TABS: 650.0000 mg | ORAL_TABLET | Freq: Four times a day (QID) | ORAL | Status: DC | PRN

## 2022-03-02 MED ORDER — ACETAMINOPHEN 325 MG PO TABS
650.0000 mg | ORAL_TABLET | Freq: Four times a day (QID) | ORAL | Status: DC | PRN
  Administered 2022-03-02 – 2022-03-03 (×2): 650 mg via ORAL
  Filled 2022-03-02 (×2): qty 2

## 2022-03-02 MED ORDER — SODIUM CHLORIDE 0.9 % IV SOLN: 2.0000 g | INTRAVENOUS | Status: DC

## 2022-03-02 NOTE — Progress Notes (Signed)
Pharmacy Antibiotic Note  April Crane is a 31 y.o. female admitted on 03/01/2022 with left mastitis.  Pharmacy has been consulted for ancef dosing. Vancomycin, ceftriaxone, and azithromycin have been discontinued.   Plan: Ancef 2g Q8H for 5 days.  Follow culture data for de-escalation.  Monitor renal function for dose adjustments as indicated.   Height: 5\' 2"  (157.5 cm) Weight: 83.5 kg (184 lb) IBW/kg (Calculated) : 50.1  Temp (24hrs), Avg:99.6 F (37.6 C), Min:98.2 F (36.8 C), Max:103.3 F (39.6 C)  Recent Labs  Lab 03/01/22 1720 03/01/22 2105 03/02/22 0318  WBC 24.2*  --  18.1*  CREATININE 0.96  --  0.56  LATICACIDVEN 1.9 1.8  --     Estimated Creatinine Clearance: 102.1 mL/min (by C-G formula based on SCr of 0.56 mg/dL).    Allergies  Allergen Reactions   Flagyl [Metronidazole] Anaphylaxis   Tetracycline Anaphylaxis    Other reaction(s): anaphylaxis, Anaphylaxis   Tetracyclines & Related Anaphylaxis   Diflucan [Fluconazole] Swelling   Ibuprofen Other (See Comments)    Damaged kidneys   Other Nausea And Vomiting    Antibiotic (patient cannot recall name/elementary school)    Thank you for allowing pharmacy to be a part of this patient's care.  Ventura Sellers 03/02/2022 12:52 PM

## 2022-03-02 NOTE — Assessment & Plan Note (Addendum)
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  Patient does report a diffuse global headache with neck pain.  However neck pain is only reproducible with rotation and appears to be more MSK related to her trapezius.  Suspect headache symptoms are due to her fever and sepsis.  Lower suspicion for acute meningitis based on other findings of tender left breast and possible bilateral pneumonia seen on CTA chest. -Patient does have MRSA positive swab and with mastitis. Will keep on IV vancomycin along with IV Rocephin and azithromycin. -Obtain strep and Legionella urinary antigen -Respiratory viral panel pending -Continue IV fluids overnight

## 2022-03-02 NOTE — Assessment & Plan Note (Addendum)
Secondary to sepsis - Continue aggressive IV fluid hydration overnight

## 2022-03-02 NOTE — ED Notes (Signed)
Got patient on the monitor patient is resting with family at bedside and call bell in reach  °

## 2022-03-02 NOTE — ED Notes (Signed)
MD Hongagli at bedside

## 2022-03-02 NOTE — Progress Notes (Signed)
PROGRESS NOTE   April Crane  XYV:[id number]    DOB: 06-Apr-1991    DOA: 03/01/2022  PCP: System, Provider Not In   I have briefly reviewed patients previous medical records in [facility].  Chief Complaint  Patient presents with   Cough    Brief Narrative:  31 year old female with medical history significant for miscarriages, SBO, presented to the ED on 03/02/2023 with complaints of subacute onset of left breast pain, cough and headache.  She started having left sided breast pain since 10/22 while breast-feeding her 14-month-old daughter, subsequently developed diffuse headache, tightness around her neck and mild intermittent nonproductive cough and dyspnea on exertion.  In ED, febrile to 103.3 F, heart rate of 140, BP 94/56, WBC 24.2, lactate normal, CTA chest negative for PE and showed possible atelectasis versus infiltrate of bilateral lower lobes.  Admitted for sepsis secondary to left mastitis.  Improving.   Assessment & Plan:  Principal Problem:   Community acquired pneumonia Active Problems:   Sepsis ([place])   Mastitis, left, acute   Hypotension   Sepsis, POA, due to left lactational mastitis and parainfluenza URTI: Met sepsis criteria on admission (temperature 103.3 F, sustained tachycardia, tachypnea and soft blood pressures, leukocytosis with confirmed source).  Received 4 L IV fluid bolus in ED followed by maintenance IV fluids at 150 mL/h.  CTA chest: No PE, bilateral lower lobe atelectasis (likely) versus infiltrate (low index of suspicion clinically for pneumonia).  Blood cultures x2: Negative.  Urine culture shows multiple species, likely contaminant.  RSV confirms parainfluenza virus.  SARS coronavirus 2 by RT-PCR and influenza A and B PCR negative.  Patient had been empirically started on IV ceftriaxone, azithromycin and vancomycin.  Discussed in detail with OB attending on call Dr. Harolyn Rutherford, recommended transitioning to IV cefazolin for lactation mastitis, no need  for breast imaging unless has recurrent fevers or persisting leukocytosis, when medically ready can be discharged on Bactrim DS 1 tab p.o. twice daily to complete total 10-day course, outpatient follow-up with her primary OB attending and can continue breast-feeding.  Changed to IV cefazolin.  Moist heat.  Hypotension: Patient not sure regarding her baseline blood pressures.  SBP's holding steady in the 90s, not really symptomatic.  S/p aggressive IV fluid resuscitation as noted above.  Continue maintenance IV fluids.  Monitor.  Leukocytosis: Secondary to infectious etiology as noted above.  Improving.  Anemia: May be dilutional or this may be her baseline.  Follow CBC in AM.  Body mass index is 33.65 kg/m./Obesity    DVT prophylaxis: enoxaparin (LOVENOX) injection 40 mg Start: 03/02/22 1000     Code Status: Full Code:  Family Communication: None at bedside Disposition:  Status is: Observation Patient meets medical intensity for inpatient care and care expected to cross 30 hours.     Consultants:   Discussed with OB MD on-call 10/24  Procedures:     Antimicrobials:   Discussed continued IV vancomycin, azithromycin and ceftriaxone IV cefazolin 10/24 >   Subjective:  Interviewed and examined along with her ED female RN as chaperone in the room.  Feels slightly better compared to admission.  Ongoing left breast pain without drainage.  Usually feeds her daughter once every night and in the day, daughter is on bottle feeds.  Mild intermittent dry cough.  DOE resolved.  Mild to moderate headache.  No other symptoms.  Objective:   Vitals:   03/02/22 0600 03/02/22 0830 03/02/22 1203 03/02/22 1225  BP: (!) 101/58 105/69  100/70  Pulse: (!) 111 (!) 115  (!) 114  Resp: 16 (!) 33  (!) 24  Temp:   99.1 F (37.3 C)   TempSrc:   Oral   SpO2: 100% 100%  100%  Weight:      Height:        General exam: Young female, moderately built and obese lying comfortably propped up in bed  without distress. Breast exam: Done along with her female ED RN in the room as chaperone.  Left breast mildly disproportionately swollen compared to the right, mild tenderness mostly in the right upper quadrant and left lower quadrant.  No clear induration or fluctuation or erythema.  Nipple without cracking or drainage. Respiratory system: Clear to auscultation. Respiratory effort normal. Cardiovascular system: S1 & S2 heard, RRR. No JVD, murmurs, rubs, gallops or clicks. No pedal edema.  Telemetry personally reviewed at bedside: Sinus rhythm. Gastrointestinal system: Abdomen is nondistended, soft and nontender. No organomegaly or masses felt. Normal bowel sounds heard. Central nervous system: Alert and oriented. No focal neurological deficits. Extremities: Symmetric 5 x 5 power. Skin: No rashes, lesions or ulcers Psychiatry: Judgement and insight appear normal. Mood & affect appropriate.     Data Reviewed:   I have personally reviewed following labs and imaging studies   CBC: Recent Labs  Lab 03/01/22 1720 03/02/22 0318  WBC 24.2* 18.1*  NEUTROABS 22.2*  --   HGB 12.4 10.0*  HCT 36.8 30.0*  MCV 86.4 88.0  PLT 297 109    Basic Metabolic Panel: Recent Labs  Lab 03/01/22 1720 03/02/22 0318  NA 138 137  K 3.5 4.0  CL 99 109  CO2 22 22  GLUCOSE 70 97  BUN 12 8  CREATININE 0.96 0.56  CALCIUM 8.7* 8.0*    Liver Function Tests: Recent Labs  Lab 03/01/22 1720  AST 18  ALT 14  ALKPHOS 54  BILITOT 2.0*  PROT 7.4  ALBUMIN 3.6    CBG: No results for input(s): "GLUCAP" in the last 168 hours.  Microbiology Studies:   Recent Results (from the past 240 hour(s))  Resp Panel by RT-PCR (Flu A&B, Covid) Anterior Nasal Swab     Status: None   Collection Time: 03/01/22  5:04 PM   Specimen: Anterior Nasal Swab  Result Value Ref Range Status   SARS Coronavirus 2 by RT PCR NEGATIVE NEGATIVE Final    Comment: (NOTE) SARS-CoV-2 target nucleic acids are NOT DETECTED.  The  SARS-CoV-2 RNA is generally detectable in upper respiratory specimens during the acute phase of infection. The lowest concentration of SARS-CoV-2 viral copies this assay can detect is 138 copies/mL. A negative result does not preclude SARS-Cov-2 infection and should not be used as the sole basis for treatment or other patient management decisions. A negative result may occur with  improper specimen collection/handling, submission of specimen other than nasopharyngeal swab, presence of viral mutation(s) within the areas targeted by this assay, and inadequate number of viral copies(<138 copies/mL). A negative result must be combined with clinical observations, patient history, and epidemiological information. The expected result is Negative.  Fact Sheet for Patients:  EntrepreneurPulse.com.au  Fact Sheet for Healthcare Providers:  IncredibleEmployment.be  This test is no t yet approved or cleared by the [place] FDA and  has been authorized for detection and/or diagnosis of SARS-CoV-2 by FDA under an Emergency Use Authorization (EUA). This EUA will remain  in effect (meaning this test can be used) for the duration of the COVID-19 declaration under Section 564(b)(1) of  the Act, 21 U.S.C.section 360bbb-3(b)(1), unless the authorization is terminated  or revoked sooner.       Influenza A by PCR NEGATIVE NEGATIVE Final   Influenza B by PCR NEGATIVE NEGATIVE Final    Comment: (NOTE) The Xpert Xpress SARS-CoV-2/FLU/RSV plus assay is intended as an aid in the diagnosis of influenza from Nasopharyngeal swab specimens and should not be used as a sole basis for treatment. Nasal washings and aspirates are unacceptable for Xpert Xpress SARS-CoV-2/FLU/RSV testing.  Fact Sheet for Patients: EntrepreneurPulse.com.au  Fact Sheet for Healthcare Providers: IncredibleEmployment.be  This test is not yet approved or  cleared by the [place] FDA and has been authorized for detection and/or diagnosis of SARS-CoV-2 by FDA under an Emergency Use Authorization (EUA). This EUA will remain in effect (meaning this test can be used) for the duration of the COVID-19 declaration under Section 564(b)(1) of the Act, 21 U.S.C. section 360bbb-3(b)(1), unless the authorization is terminated or revoked.  Performed at [facility], [place] [address], [place], [place] [address]   Respiratory (~20 pathogens) panel by PCR     Status: Abnormal   Collection Time: 03/01/22  5:04 PM   Specimen: Nasopharyngeal Swab; Respiratory  Result Value Ref Range Status   Adenovirus NOT DETECTED NOT DETECTED Final   Coronavirus 229E NOT DETECTED NOT DETECTED Final    Comment: (NOTE) The Coronavirus on the Respiratory Panel, DOES NOT test for the novel  Coronavirus (2019 nCoV)    Coronavirus HKU1 NOT DETECTED NOT DETECTED Final   Coronavirus NL63 NOT DETECTED NOT DETECTED Final   Coronavirus OC43 NOT DETECTED NOT DETECTED Final   Metapneumovirus NOT DETECTED NOT DETECTED Final   Rhinovirus / Enterovirus NOT DETECTED NOT DETECTED Final   Influenza A NOT DETECTED NOT DETECTED Final   Influenza B NOT DETECTED NOT DETECTED Final   Parainfluenza Virus 1 NOT DETECTED NOT DETECTED Final   Parainfluenza Virus 2 NOT DETECTED NOT DETECTED Final   Parainfluenza Virus 3 NOT DETECTED NOT DETECTED Final   Parainfluenza Virus 4 DETECTED (A) NOT DETECTED Final   Respiratory Syncytial Virus NOT DETECTED NOT DETECTED Final   Bordetella pertussis NOT DETECTED NOT DETECTED Final   Bordetella Parapertussis NOT DETECTED NOT DETECTED Final   Chlamydophila pneumoniae NOT DETECTED NOT DETECTED Final   Mycoplasma pneumoniae NOT DETECTED NOT DETECTED Final    Comment: Performed at [facility], [place]. [address] [place], [place] [address]  Culture, blood (Routine x 2)     Status: None (Preliminary result)   Collection Time: 03/01/22  5:20 PM    Specimen: BLOOD  Result Value Ref Range Status   Specimen Description BLOOD SITE NOT SPECIFIED  Final   Special Requests   Final    BOTTLES DRAWN AEROBIC ONLY Blood Culture adequate volume   Culture   Final    NO GROWTH < 24 HOURS Performed at [facility], [place] [address] [place], [place] [address]    Report Status PENDING  Incomplete  Culture, blood (Routine x 2)     Status: None (Preliminary result)   Collection Time: 03/01/22  5:42 PM   Specimen: BLOOD  Result Value Ref Range Status   Specimen Description BLOOD RIGHT ANTECUBITAL  Final   Special Requests   Final    BOTTLES DRAWN AEROBIC AND ANAEROBIC Blood Culture results may not be optimal due to an excessive volume of blood received in culture bottles   Culture   Final    NO GROWTH <  24 HOURS Performed at [facility], [place] [address] [place], [place] [address]    Report Status PENDING  Incomplete  Urine Culture     Status: Abnormal   Collection Time: 03/01/22  7:12 PM   Specimen: In/Out Cath Urine  Result Value Ref Range Status   Specimen Description IN/OUT CATH URINE  Final   Special Requests   Final    NONE Performed at [facility], [place] [address], [place], [place] [address]    Culture MULTIPLE SPECIES PRESENT, SUGGEST RECOLLECTION (A)  Final   Report Status 03/02/2022 FINAL  Final  MRSA Next Gen by PCR, Nasal     Status: Abnormal   Collection Time: 03/01/22 11:21 PM   Specimen: Nasal Mucosa; Nasal Swab  Result Value Ref Range Status   MRSA by PCR Next Gen DETECTED (A) NOT DETECTED Final    Comment: CRITICAL RESULT CALLED TO, READ BACK BY AND VERIFIED WITH:  C/ J. [facility], RN 03/02/22 0052 A. LAFRANCE (NOTE) The GeneXpert MRSA Assay (FDA approved for NASAL specimens only), is one component of a comprehensive MRSA colonization surveillance program. It is not intended to diagnose MRSA infection nor to guide or monitor treatment for MRSA infections. Test performance is not FDA approved in  patients less than 26 years old. Performed at [facility], [place] [address] [place], [place] [address]     Radiology Studies:  CT Angio Chest PE W and/or Wo Contrast  Result Date: 03/02/2022 CLINICAL DATA:  Pulmonary embolism suspected, high probability. Cough. EXAM: CT ANGIOGRAPHY CHEST WITH CONTRAST TECHNIQUE: Multidetector CT imaging of the chest was performed using the standard protocol during bolus administration of intravenous contrast. Multiplanar CT image reconstructions and MIPs were obtained to evaluate the vascular anatomy. RADIATION DOSE REDUCTION: This exam was performed according to the departmental dose-optimization program which includes automated exposure control, adjustment of the mA and/or kV according to patient size and/or use of iterative reconstruction technique. CONTRAST:  50m OMNIPAQUE IOHEXOL 350 MG/ML SOLN COMPARISON:  None Available. FINDINGS: Cardiovascular: Heart is borderline enlarged. No pericardial effusion. The aorta and pulmonary trunk are normal in caliber. No pulmonary artery filling defect is identified. Mediastinum/Nodes: No enlarged mediastinal, hilar, or axillary lymph nodes. Thyroid gland, trachea, and esophagus demonstrate no significant findings. Lungs/Pleura: Mild atelectasis or infiltrate is noted in the lower lobes bilaterally. No effusion or pneumothorax Upper Abdomen: No acute abnormality. Musculoskeletal: No acute osseous abnormality. Review of the MIP images confirms the above findings. IMPRESSION: 1. No evidence of pulmonary embolism. 2. Minimal atelectasis or infiltrate in the lower lobes bilaterally. Electronically Signed   By: LBrett FairyM.D.   On: 03/02/2022 00:17   DG Chest 2 View  Result Date: 03/01/2022 CLINICAL DATA:  31year old female with sepsis EXAM: CHEST - 2 VIEW COMPARISON:  12/24/2019 FINDINGS: Cardiomediastinal silhouette unchanged in size and contour. No evidence of central vascular congestion. No interlobular septal  thickening. No pneumothorax or pleural effusion. No confluent airspace disease. No acute displaced fracture IMPRESSION: Negative for acute cardiopulmonary disease Electronically Signed   By: JCorrie MckusickD.O.   On: 03/01/2022 18:57    Scheduled Meds:    enoxaparin (LOVENOX) injection  40 mg Subcutaneous Daily    Continuous Infusions:     ceFAZolin (ANCEF) IV 2 g (03/02/22 1318)     LOS: 0 days     AVernell Leep MD,  FACP, FHM, [facility] [place]  To contact the attending provider between 7A-7P or the covering provider during after hours 7P-7A, please log into the web site [email or web address] and access using universal [place] password for that web site. If you do not have the password, please call the hospital operator.  03/02/2022, 1:51 PM

## 2022-03-02 NOTE — H&P (Signed)
History and Physical    Patient: April Crane [id number] DOB: 1991-02-01 DOA: 03/01/2022 DOS: the patient was seen and examined on 03/02/2022 PCP: System, Provider Not In  Patient coming from: Home  Chief Complaint:  Chief Complaint  Patient presents with   Cough   HPI: [name] is a 31 y.o. female with medical history significant of miscarriages, SBO who presents with breast pain, cough and headache.   Patient reported acute left sided breast pain since yesterday while breast-feeding her 54-month-old.  She then subsequently had diffuse headache and tightness around her neck.  Also has noted increasing cough.  Has felt shortness of breath with exertion sometimes.  Daughter did have symptoms of coughing several days ago but was told it was just allergies. She also works in a daycare.  Denies any tobacco use, alcohol illicit drug use.  No recent travel.  In the ED, temperature of 103.61F, HR of 140, BP of 94/56 WBC of 24.2, Hgb of 12.4.  Lactate within normal limits.  Sodium of 138, K of 3.5, creatinine of 0.96  Chest x-ray was negative. CTA chest negative for PE and show possible atelectasis or infiltrate in the lower lobes bilateral.  Negative flu/COVID pcr.   She was given IV vancomycin, Rocephin and azithromycin Sepsis fluid in the ED. Review of Systems: As mentioned in the history of present illness. All other systems reviewed and are negative. 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 She has never used smokeless  tobacco. She reports that she does not drink alcohol and does not use drugs.  Allergies  Allergen Reactions   Flagyl [Metronidazole] Anaphylaxis   Tetracycline Anaphylaxis    Other reaction(s): anaphylaxis, Anaphylaxis   Tetracyclines & Related Anaphylaxis   Diflucan [Fluconazole] Swelling   Ibuprofen Other (See Comments)    Damaged kidneys   Other Nausea And Vomiting    Antibiotic (patient cannot recall name/elementary school)    Family History  Problem Relation Age of Onset   Asthma Sister    Cancer Maternal Grandmother    Allergic rhinitis Neg Hx    Angioedema Neg Hx    Atopy Neg Hx    Eczema Neg Hx    Immunodeficiency Neg Hx    Urticaria Neg Hx     Prior to Admission medications   Medication Sig Start Date End Date Taking? Authorizing Provider  naproxen sodium (ALEVE) 220 MG tablet Take 440 mg by mouth 2 (two) times daily as needed (pain).   Yes [provider]    Physical Exam: Vitals:   03/01/22 2145 03/01/22 2200 03/01/22 2315 03/01/22 2321  BP: 96/65 90/65 90/62    Pulse: (!) 106 (!) 103 (!) 107   Resp: 16 18 18    Temp:    98.2 F (36.8 C)  TempSrc:    Oral  SpO2: 100% 100% 100%   Weight:      Height:       Constitutional: NAD, calm, comfortable, nontoxic appearing young female  laying at approximately 30 degree incline in bed Eyes: lids and conjunctivae normal ENMT: Mucous membranes are moist. Neck: normal, supple Respiratory: clear to auscultation bilaterally, no wheezing, no crackles. Normal respiratory effort. No accessory muscle use.  Occasional not active cough. Cardiovascular: Regular rate and rhythm, no murmurs / rubs / gallops. No extremity edema.  Breast: Slight increase warmth to the left anterior breast with pain with mild palpation.  No fluctuant or mass palpated.  No overlying erythema. Abdomen: no tenderness, Bowel sounds positive.  Musculoskeletal: no clubbing / cyanosis. No joint deformity upper and lower extremities. Normal muscle  tone.  Skin: no rashes, lesions, ulcers. No induration Neurologic: CN 2-12 grossly intact. Sensation intact, Strength 5/5 in all 4.  Negative Kernig's and Brudzinski sign  Psychiatric: Normal judgment and insight. Alert and oriented x 3. Normal mood. Data Reviewed:  See HPI  Assessment and Plan:  31 year old female presenting with sepsis secondary to lactational mastitis and probable bilateral pneumonia.  Sepsis -Presented with fever, tachycardia and leukocytosis -Secondary to lactational mastitis and probable community-acquired pneumonia.  Patient does report a diffuse global headache with neck pain.  However neck pain is only reproducible with rotation and appears to be more MSK related to her trapezius.  Suspect headache symptoms are due to her fever and sepsis.  Lower suspicion for acute meningitis based on other findings of tender left breast and possible bilateral pneumonia seen on CTA chest. -Patient does have MRSA nasal positive swab and mastitis. Will keep on IV vancomycin along with IV Rocephin and azithromycin. -Obtain strep and Legionella urinary antigen -Respiratory viral panel pending -Continue IV fluids overnight  Hypotension Secondary to sepsis - Continue aggressive IV fluid hydration overnight   Advance Care Planning:   Code Status: Full Code   Consults: none  Family Communication: none at bedside  Severity of Illness: The appropriate patient status for this patient is OBSERVATION. Observation status is judged to be reasonable and necessary in order to provide the required intensity of service to ensure the patient's safety. The patient's presenting symptoms, physical exam findings, and initial radiographic and laboratory data in the context of their medical condition is felt to place them at decreased risk for further clinical deterioration. Furthermore, it is anticipated that the patient will be medically stable for discharge from the hospital within 2 midnights of  admission.   Author: Orene Desanctis, DO 03/02/2022 1:34 AM  For on call review [email or web address].

## 2022-03-03 DIAGNOSIS — N61 Mastitis without abscess: Secondary | ICD-10-CM | POA: Diagnosis not present

## 2022-03-03 DIAGNOSIS — R6520 Severe sepsis without septic shock: Secondary | ICD-10-CM

## 2022-03-03 DIAGNOSIS — A419 Sepsis, unspecified organism: Secondary | ICD-10-CM | POA: Diagnosis not present

## 2022-03-03 LAB — BASIC METABOLIC PANEL
Anion gap: 8 (ref 5–15)
BUN: <5 mg/dL — ABNORMAL LOW (ref 6–20)
CO2: 24 mmol/L (ref 22–32)
Calcium: 9.2 mg/dL (ref 8.9–10.3)
Chloride: 108 mmol/L (ref 98–111)
Creatinine, Ser: 0.57 mg/dL (ref 0.44–1.00)
GFR, Estimated: >60 mL/min (ref >60)
Glucose, Bld: 98 mg/dL (ref 70–99)
Potassium: 3.5 mmol/L (ref 3.5–5.1)
Sodium: 140 mmol/L (ref 135–145)

## 2022-03-03 LAB — CBC
HCT: 30.3 % — ABNORMAL LOW (ref 36.0–46.0)
Hemoglobin: 10.4 g/dL — ABNORMAL LOW (ref 12.0–15.0)
MCH: 29.1 pg (ref 26.0–34.0)
MCHC: 34.3 g/dL (ref 30.0–36.0)
MCV: 84.6 fL (ref 80.0–100.0)
Platelets: 238 10*3/uL (ref 150–400)
RBC: 3.58 MIL/uL — ABNORMAL LOW (ref 3.87–5.11)
RDW: 14.1 % (ref 11.5–15.5)
WBC: 13.2 10*3/uL — ABNORMAL HIGH (ref 4.0–10.5)
nRBC: 0 % (ref 0.0–0.2)

## 2022-03-03 LAB — LEGIONELLA PNEUMOPHILA SEROGP 1 UR AG: L. pneumophila Serogp 1 Ur Ag: NEGATIVE

## 2022-03-03 MED ORDER — ACETAMINOPHEN 325 MG PO TABS: 650.0000 mg | ORAL_TABLET | Freq: Four times a day (QID) | ORAL | Status: DC | PRN

## 2022-03-03 MED ORDER — SULFAMETHOXAZOLE-TRIMETHOPRIM 800-160 MG PO TABS: 1.0000 | ORAL_TABLET | Freq: Two times a day (BID) | ORAL | 0 refills | Status: DC

## 2022-03-03 MED ORDER — INFLUENZA VAC SPLIT QUAD 0.5 ML IM SUSY: 0.5000 mL | PREFILLED_SYRINGE | INTRAMUSCULAR | Status: DC

## 2022-03-03 NOTE — Discharge Summary (Signed)
Physician Discharge Summary   Patient: April Crane MRN: [id number] DOB: [birth year]  Admit date:     03/01/2022  Discharge date: 03/03/22  Discharge Physician: Meredeth Ide   PCP: System, Provider Not In   Recommendations at discharge:   Patient to follow-up with primary OB as outpatient We will discharge on Bactrim DS 1 tablet p.o. twice daily for 7 more days to complete 10-day course Continue to breast-feed as per OB recommendation  Discharge Diagnoses: Principal Problem:   Community acquired pneumonia Active Problems:   Sepsis (HCC)   Mastitis, left, acute   Hypotension   Mastoiditis   Mastitis  Resolved Problems:   * No resolved hospital problems. *  Hospital Course: 31 year old female with medical history significant for miscarriages, SBO, presented to the ED on 03/02/2023 with complaints of subacute onset of left breast pain, cough and headache.  She started having left sided breast pain since 10/22 while breast-feeding her 77-month-old daughter, subsequently developed diffuse headache, tightness around her neck and mild intermittent nonproductive cough and dyspnea on exertion.  In ED, febrile to 103.3 F, heart rate of 140, BP 94/56, WBC 24.2, lactate normal, CTA chest negative for PE and showed possible atelectasis versus infiltrate of bilateral lower lobes.  Admitted for sepsis secondary to left mastitis.   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 Macon Large, recommended transitioning to IV cefazolin for lactation mastitis, no need for breast imaging unless has recurrent fevers or persisting leukocytosis, when medically ready can be discharged on Bactrim DS 1 tab p.o. twice daily to  complete total 10-day course, outpatient follow-up with her primary OB attending and can continue breast-feeding  -We will discharge on Bactrim DS 1 tablet p.o. twice daily for 7 more days -Continue Tylenol, naproxen as needed for pain  Hypotension -Likely in setting of sepsis as above -Resolved  Anemia -Hemoglobin is stable at 10.4 -       Consultants:  Procedures performed:  Disposition: Home Diet recommendation:  Discharge Diet Orders (From admission, onward)     Start     Ordered   03/03/22 0000  Diet - low sodium heart healthy        03/03/22 0856           Regular diet DISCHARGE MEDICATION: Allergies as of 03/03/2022       Reactions   Flagyl [metronidazole] Anaphylaxis   Tetracycline Anaphylaxis   Other reaction(s): anaphylaxis, Anaphylaxis   Tetracyclines & Related Anaphylaxis   Diflucan [fluconazole] Swelling   Ibuprofen Other (See Comments)   Damaged kidneys   Other Nausea And Vomiting   Antibiotic (patient cannot recall name/elementary school)        Medication List     TAKE these medications    acetaminophen 325 MG tablet Commonly known as: TYLENOL Take 2 tablets (650 mg total) by mouth every 6 (six) hours as needed for mild pain, fever, headache or moderate pain.   naproxen sodium 220 MG tablet Commonly known as: ALEVE Take 440 mg by mouth 2 (two) times daily as needed (pain).   sulfamethoxazole-trimethoprim 800-160 MG tablet Commonly known as: BACTRIM DS Take 1 tablet by mouth 2 (two) times daily.        Follow-up Information     [facility] Follow up on 04/19/2022.   Why: 9:30 am with Tawanna Cooler  NP Contact information: [address] [place] [address] [phone]               Discharge Exam: Ceasar Mons Weights   03/01/22 1700  Weight: 83.5 kg   General-appears in no acute distress Heart-S1-S2, regular, no murmur auscultated Lungs-clear to  auscultation bilaterally, no wheezing or crackles auscultated Abdomen-soft, nontender, no organomegaly Extremities-no edema in the lower extremities Neuro-alert, oriented x3, no focal deficit noted  Condition at discharge: good  The results of significant diagnostics from this hospitalization (including imaging, microbiology, ancillary and laboratory) are listed below for reference.   Imaging Studies: CT Angio Chest PE W and/or Wo Contrast  Result Date: 03/02/2022 CLINICAL DATA:  Pulmonary embolism suspected, high probability. Cough. EXAM: CT ANGIOGRAPHY CHEST WITH CONTRAST TECHNIQUE: Multidetector CT imaging of the chest was performed using the standard protocol during bolus administration of intravenous contrast. Multiplanar CT image reconstructions and MIPs were obtained to evaluate the vascular anatomy. RADIATION DOSE REDUCTION: This exam was performed according to the departmental dose-optimization program which includes automated exposure control, adjustment of the mA and/or kV according to patient size and/or use of iterative reconstruction technique. CONTRAST:  75mL OMNIPAQUE IOHEXOL 350 MG/ML SOLN COMPARISON:  None Available. FINDINGS: Cardiovascular: Heart is borderline enlarged. No pericardial effusion. The aorta and pulmonary trunk are normal in caliber. No pulmonary artery filling defect is identified. Mediastinum/Nodes: No enlarged mediastinal, hilar, or axillary lymph nodes. Thyroid gland, trachea, and esophagus demonstrate no significant findings. Lungs/Pleura: Mild atelectasis or infiltrate is noted in the lower lobes bilaterally. No effusion or pneumothorax Upper Abdomen: No acute abnormality. Musculoskeletal: No acute osseous abnormality. Review of the MIP images confirms the above findings. IMPRESSION: 1. No evidence of pulmonary embolism. 2. Minimal atelectasis or infiltrate in the lower lobes bilaterally. Electronically Signed   By: Thornell Sartorius M.D.   On: 03/02/2022 00:17   DG  Chest 2 View  Result Date: 03/01/2022 CLINICAL DATA:  31 year old female with sepsis EXAM: CHEST - 2 VIEW COMPARISON:  12/24/2019 FINDINGS: Cardiomediastinal silhouette unchanged in size and contour. No evidence of central vascular congestion. No interlobular septal thickening. No pneumothorax or pleural effusion. No confluent airspace disease. No acute displaced fracture IMPRESSION: Negative for acute cardiopulmonary disease Electronically Signed   By: Gilmer Mor D.O.   On: 03/01/2022 18:57    Microbiology: Results for orders placed or performed during the hospital encounter of 03/01/22  Resp Panel by RT-PCR (Flu A&B, Covid) Anterior Nasal Swab     Status: None   Collection Time: 03/01/22  5:04 PM   Specimen: Anterior Nasal Swab  Result Value Ref Range Status   SARS Coronavirus 2 by RT PCR NEGATIVE NEGATIVE Final    Comment: (NOTE) SARS-CoV-2 target nucleic acids are NOT DETECTED.  The SARS-CoV-2 RNA is generally detectable in upper respiratory specimens during the acute phase of infection. The lowest concentration of SARS-CoV-2 viral copies this assay can detect is 138 copies/mL. A negative result does not preclude SARS-Cov-2 infection and should not be used as the sole basis for treatment or other patient management decisions. A negative result may occur with  improper specimen collection/handling, submission of specimen other than nasopharyngeal swab, presence of viral mutation(s) within the areas targeted by this assay, and inadequate number of viral copies(<138 copies/mL). A negative result must be combined with clinical observations, patient history, and epidemiological information. The expected result is Negative.  Fact Sheet for Patients:  BloggerCourse.com  Fact Sheet for Healthcare Providers:  SeriousBroker.it  This test is no t yet approved or cleared by the [place] FDA and  has been authorized for detection  and/or diagnosis of SARS-CoV-2 by FDA under an Emergency Use Authorization (EUA). This EUA will remain  in effect (meaning this test can be used) for the duration of the COVID-19 declaration under Section 564(b)(1) of the Act, 21 U.S.C.section 360bbb-3(b)(1), unless the authorization is terminated  or revoked sooner.       Influenza A by PCR NEGATIVE NEGATIVE Final   Influenza B by PCR NEGATIVE NEGATIVE Final    Comment: (NOTE) The Xpert Xpress SARS-CoV-2/FLU/RSV plus assay is intended as an aid in the diagnosis of influenza from Nasopharyngeal swab specimens and should not be used as a sole basis for treatment. Nasal washings and aspirates are unacceptable for Xpert Xpress SARS-CoV-2/FLU/RSV testing.  Fact Sheet for Patients: BloggerCourse.com  Fact Sheet for Healthcare Providers: SeriousBroker.it  This test is not yet approved or cleared by the [place] FDA and has been authorized for detection and/or diagnosis of SARS-CoV-2 by FDA under an Emergency Use Authorization (EUA). This EUA will remain in effect (meaning this test can be used) for the duration of the COVID-19 declaration under Section 564(b)(1) of the Act, 21 U.S.C. section 360bbb-3(b)(1), unless the authorization is terminated or revoked.  Performed at [facility], [address], [place], [place] [address]   Respiratory (~20 pathogens) panel by PCR     Status: Abnormal   Collection Time: 03/01/22  5:04 PM   Specimen: Nasopharyngeal Swab; Respiratory  Result Value Ref Range Status   Adenovirus NOT DETECTED NOT DETECTED Final   Coronavirus 229E NOT DETECTED NOT DETECTED Final    Comment: (NOTE) The Coronavirus on the Respiratory Panel, DOES NOT test for the novel  Coronavirus (2019 nCoV)    Coronavirus HKU1 NOT DETECTED NOT DETECTED Final   Coronavirus NL63 NOT DETECTED NOT DETECTED Final   Coronavirus OC43 NOT DETECTED NOT DETECTED Final    Metapneumovirus NOT DETECTED NOT DETECTED Final   Rhinovirus / Enterovirus NOT DETECTED NOT DETECTED Final   Influenza A NOT DETECTED NOT DETECTED Final   Influenza B NOT DETECTED NOT DETECTED Final   Parainfluenza Virus 1 NOT DETECTED NOT DETECTED Final   Parainfluenza Virus 2 NOT DETECTED NOT DETECTED Final   Parainfluenza Virus 3 NOT DETECTED NOT DETECTED Final   Parainfluenza Virus 4 DETECTED (A) NOT DETECTED Final   Respiratory Syncytial Virus NOT DETECTED NOT DETECTED Final   Bordetella pertussis NOT DETECTED NOT DETECTED Final   Bordetella Parapertussis NOT DETECTED NOT DETECTED Final   Chlamydophila pneumoniae NOT DETECTED NOT DETECTED Final   Mycoplasma pneumoniae NOT DETECTED NOT DETECTED Final    Comment: Performed at [facility], [address] [place], [place] [address]  Culture, blood (Routine x 2)     Status: None (Preliminary result)   Collection Time: 03/01/22  5:20 PM   Specimen: BLOOD  Result Value Ref Range Status   Specimen Description BLOOD SITE NOT SPECIFIED  Final   Special Requests   Final    BOTTLES DRAWN AEROBIC ONLY Blood Culture adequate volume   Culture   Final    NO GROWTH 2 DAYS Performed at [facility], [address] [place], [place] [address]    Report Status PENDING  Incomplete  Culture, blood (Routine x 2)     Status: None (Preliminary result)  Collection Time: 03/01/22  5:42 PM   Specimen: BLOOD  Result Value Ref Range Status   Specimen Description BLOOD RIGHT ANTECUBITAL  Final   Special Requests   Final    BOTTLES DRAWN AEROBIC AND ANAEROBIC Blood Culture results may not be optimal due to an excessive volume of blood received in culture bottles   Culture   Final    NO GROWTH 2 DAYS Performed at [facility] [address], [place], [place] [address]    Report Status PENDING  Incomplete  Urine Culture     Status: Abnormal   Collection Time: 03/01/22  7:12 PM   Specimen: In/Out Cath Urine  Result Value Ref Range  Status   Specimen Description IN/OUT CATH URINE  Final   Special Requests   Final    NONE Performed at [facility], [place] [address], [place], [place] [address]    Culture MULTIPLE SPECIES PRESENT, SUGGEST RECOLLECTION (A)  Final   Report Status 03/02/2022 FINAL  Final  MRSA Next Gen by PCR, Nasal     Status: Abnormal   Collection Time: 03/01/22 11:21 PM   Specimen: Nasal Mucosa; Nasal Swab  Result Value Ref Range Status   MRSA by PCR Next Gen DETECTED (A) NOT DETECTED Final    Comment: CRITICAL RESULT CALLED TO, READ BACK BY AND VERIFIED WITH:  [facility], RN 03/02/22 0052 A. LAFRANCE (NOTE) The GeneXpert MRSA Assay (FDA approved for NASAL specimens only), is one component of a comprehensive MRSA colonization surveillance program. It is not intended to diagnose MRSA infection nor to guide or monitor treatment for MRSA infections. Test performance is not FDA approved in patients less than 78 years old. Performed at [facility], [place] [address], [place], [place] [address]     Labs: CBC: Recent Labs  Lab 03/01/22 1720 03/02/22 0318 03/03/22 0703  WBC 24.2* 18.1* 13.2*  NEUTROABS 22.2*  --   --   HGB 12.4 10.0* 10.4*  HCT 36.8 30.0* 30.3*  MCV 86.4 88.0 84.6  PLT 297 221 086   Basic Metabolic Panel: Recent Labs  Lab 03/01/22 1720 03/02/22 0318 03/03/22 0703  NA 138 137 140  K 3.5 4.0 3.5  CL 99 109 108  CO2 22 22 24   GLUCOSE 70 97 98  BUN 12 8 <5*  CREATININE 0.96 0.56 0.57  CALCIUM 8.7* 8.0* 9.2   Liver Function Tests: Recent Labs  Lab 03/01/22 1720  AST 18  ALT 14  ALKPHOS 54  BILITOT 2.0*  PROT 7.4  ALBUMIN 3.6   CBG: No results for input(s): "GLUCAP" in the last 168 hours.  Discharge time spent: greater than 30 minutes.  Signed: Oswald Hillock, MD [facility] 03/03/2022

## 2022-03-04 ENCOUNTER — Other Ambulatory Visit: Payer: Self-pay

## 2022-03-04 ENCOUNTER — Emergency Department (HOSPITAL_BASED_OUTPATIENT_CLINIC_OR_DEPARTMENT_OTHER)
Admission: EM | Admit: 2022-03-04 | Discharge: 2022-03-04 | Disposition: A | Discharge status: Home / Self Care | Payer: Medicaid Other | Attending: Emergency Medicine | Admitting: Emergency Medicine

## 2022-03-04 ENCOUNTER — Ambulatory Visit (HOSPITAL_COMMUNITY)
Admission: EM | Admit: 2022-03-04 | Discharge: 2022-03-04 | Disposition: A | Discharge status: Home / Self Care | Payer: Medicaid Other | Source: Ambulatory Visit | Attending: Physician Assistant | Admitting: Physician Assistant

## 2022-03-04 ENCOUNTER — Encounter (HOSPITAL_BASED_OUTPATIENT_CLINIC_OR_DEPARTMENT_OTHER): Payer: Self-pay

## 2022-03-04 ENCOUNTER — Emergency Department (HOSPITAL_COMMUNITY)
Admission: EM | Admit: 2022-03-04 | Discharge: 2022-03-05 | Discharge status: Against Medical Advice | Payer: Medicaid Other | Source: Home / Self Care

## 2022-03-04 DIAGNOSIS — R531 Weakness: Secondary | ICD-10-CM | POA: Diagnosis not present

## 2022-03-04 DIAGNOSIS — E876 Hypokalemia: Secondary | ICD-10-CM | POA: Diagnosis not present

## 2022-03-04 DIAGNOSIS — R42 Dizziness and giddiness: Secondary | ICD-10-CM | POA: Diagnosis present

## 2022-03-04 LAB — COMPREHENSIVE METABOLIC PANEL
ALT: 19 U/L (ref 0–44)
AST: 11 U/L — ABNORMAL LOW (ref 15–41)
Albumin: 3.9 g/dL (ref 3.5–5.0)
Alkaline Phosphatase: 44 U/L (ref 38–126)
Anion gap: 9 (ref 5–15)
BUN: 9 mg/dL (ref 6–20)
CO2: 25 mmol/L (ref 22–32)
Calcium: 9 mg/dL (ref 8.9–10.3)
Chloride: 106 mmol/L (ref 98–111)
Creatinine, Ser: 0.69 mg/dL (ref 0.44–1.00)
GFR, Estimated: >60 mL/min (ref >60)
Glucose, Bld: 110 mg/dL — ABNORMAL HIGH (ref 70–99)
Potassium: 3.2 mmol/L — ABNORMAL LOW (ref 3.5–5.1)
Sodium: 140 mmol/L (ref 135–145)
Total Bilirubin: 0.5 mg/dL (ref 0.3–1.2)
Total Protein: 7.4 g/dL (ref 6.5–8.1)

## 2022-03-04 LAB — CBC
HCT: 30.3 % — ABNORMAL LOW (ref 36.0–46.0)
Hemoglobin: 10.4 g/dL — ABNORMAL LOW (ref 12.0–15.0)
MCH: 29.1 pg (ref 26.0–34.0)
MCHC: 34.3 g/dL (ref 30.0–36.0)
MCV: 84.6 fL (ref 80.0–100.0)
Platelets: 325 10*3/uL (ref 150–400)
RBC: 3.58 MIL/uL — ABNORMAL LOW (ref 3.87–5.11)
RDW: 14.2 % (ref 11.5–15.5)
WBC: 6.5 10*3/uL (ref 4.0–10.5)
nRBC: 0 % (ref 0.0–0.2)

## 2022-03-04 LAB — HCG, SERUM, QUALITATIVE: Preg, Serum: NEGATIVE

## 2022-03-04 LAB — CBG MONITORING, ED: Glucose-Capillary: 108 mg/dL — ABNORMAL HIGH (ref 70–99)

## 2022-03-04 MED ORDER — POTASSIUM CHLORIDE CRYS ER 20 MEQ PO TBCR
40.0000 meq | EXTENDED_RELEASE_TABLET | Freq: Once | ORAL | Status: AC
  Administered 2022-03-04: 40 meq via ORAL
  Filled 2022-03-04: qty 2

## 2022-03-04 NOTE — Discharge Instructions (Signed)
You were seen in the emergency department for your dizziness.  Your labs showed that your potassium level was mildly low which we repleted with oral potassium supplement in the ER.  You otherwise had no signs of severe dehydration or anemia and no signs that your infection was getting worse.  You had no signs of any stroke on your exam.  You should continue to drink plenty of fluids over the next few days and stay well-hydrated and follow-up with your primary doctor as planned.  You should return to the emergency department sooner if you are having increasing dizziness and you pass out, you have numbness or weakness on one side of the body compared to the other, you are having fevers despite the antibiotics or if you have any other new or concerning symptoms.

## 2022-03-04 NOTE — ED Provider Notes (Signed)
[facility] EMERGENCY DEPT Provider Note   CSN: [id number] Arrival date & time: 03/04/22  2000     History  Chief Complaint  Patient presents with   Dizziness    April Crane is a 31 y.o. female.  Patient is a 31 year old female with recent admission for sepsis secondary to mastitis presenting to the emergency department with weakness and dizziness.  Patient states that she was initially feeling well when she woke up this morning but she states throughout the day she has had intermittent dizziness.  She is unable to describe the dizziness and states that sometimes she feels off balance and other times she feels lightheaded.  She states that will happen when she is at rest and will last for few minutes at a time.  She denies any nausea, vomiting or diarrhea but does report decreased appetite and has not drank significant fluids today.  She denies any chest pain or shortness of breath.  States she feels weak in her bilateral legs.  She denies any numbness or tingling.  She denies any vision changes.  She states she has been taking her antibiotics as prescribed.  April Crane also reported that she had some confusion where she could not remember how much water that she drink today when they called her primary doctor who recommended that she come to the ER to be evaluated.  The history is provided by the patient and a relative.  Dizziness      Home Medications Prior to Admission medications   Medication Sig Start Date End Date Taking? Authorizing Provider  acetaminophen (TYLENOL) 325 MG tablet Take 2 tablets (650 mg total) by mouth every 6 (six) hours as needed for mild pain, fever, headache or moderate pain. 03/03/22   Meredeth Ide, MD  naproxen sodium (ALEVE) 220 MG tablet Take 440 mg by mouth 2 (two) times daily as needed (pain).    [provider]  sulfamethoxazole-trimethoprim (BACTRIM DS) 800-160 MG tablet Take 1 tablet by mouth 2 (two) times daily. 03/03/22    Meredeth Ide, MD      Allergies    Flagyl [metronidazole], Tetracycline, Tetracyclines & related, Diflucan [fluconazole], Ibuprofen, and Other    Review of Systems   Review of Systems  Neurological:  Positive for dizziness.    Physical Exam Updated Vital Signs BP 105/76   Pulse 72   Temp (!) 97 F (36.1 C)   Resp 19   Ht 5\' 2"  (1.575 m)   Wt 83.5 kg   LMP 08/21/2021   SpO2 100%   BMI 33.67 kg/m  Physical Exam Vitals and nursing note reviewed.  Constitutional:      General: She is not in acute distress.    Appearance: Normal appearance.  HENT:     Head: Normocephalic and atraumatic.     Right Ear: Tympanic membrane, ear canal and external ear normal.     Left Ear: Tympanic membrane, ear canal and external ear normal.     Nose: Nose normal.     Mouth/Throat:     Mouth: Mucous membranes are moist.     Pharynx: Oropharynx is clear.  Eyes:     Extraocular Movements: Extraocular movements intact.     Conjunctiva/sclera: Conjunctivae normal.     Pupils: Pupils are equal, round, and reactive to light.     Comments: No nystagmus  Cardiovascular:     Rate and Rhythm: Normal rate and regular rhythm.     Pulses: Normal pulses.  Heart sounds: Normal heart sounds.  Pulmonary:     Effort: Pulmonary effort is normal.     Breath sounds: Normal breath sounds.  Abdominal:     General: Abdomen is flat.     Palpations: Abdomen is soft.     Tenderness: There is no abdominal tenderness.  Musculoskeletal:        General: Normal range of motion.     Cervical back: Normal range of motion and neck supple.     Right lower leg: No edema.     Left lower leg: No edema.  Skin:    General: Skin is warm and dry.  Neurological:     General: No focal deficit present.     Mental Status: She is alert and oriented to person, place, and time.     Cranial Nerves: No cranial nerve deficit.     Sensory: No sensory deficit.     Motor: No weakness.     Coordination: Coordination normal.      Gait: Gait normal.  Psychiatric:        Mood and Affect: Mood normal.        Behavior: Behavior normal.     ED Results / Procedures / Treatments   Labs (all labs ordered are listed, but only abnormal results are displayed) Labs Reviewed  COMPREHENSIVE METABOLIC PANEL - Abnormal; Notable for the following components:      Result Value   Potassium 3.2 (*)    Glucose, Bld 110 (*)    AST 11 (*)    All other components within normal limits  CBC - Abnormal; Notable for the following components:   RBC 3.58 (*)    Hemoglobin 10.4 (*)    HCT 30.3 (*)    All other components within normal limits  CBG MONITORING, ED - Abnormal; Notable for the following components:   Glucose-Capillary 108 (*)    All other components within normal limits  HCG, SERUM, QUALITATIVE    EKG None  Radiology No results found.  Procedures Procedures    Medications Ordered in ED Medications  potassium chloride SA (KLOR-CON M) CR tablet 40 mEq (has no administration in time range)    ED Course/ Medical Decision Making/ A&P                           Medical Decision Making This patient presents to the ED with chief complaint(s) of dizziness with pertinent past medical history of sent admission for sepsis secondary to mastitis which further complicates the presenting complaint. The complaint involves an extensive differential diagnosis and also carries with it a high risk of complications and morbidity.    The differential diagnosis includes ACS, arrhythmia, anemia, electrolyte abnormality, dehydration, infection, sepsis, pregnancy  Additional history obtained: Additional history obtained from family Records reviewed previous admission documents  ED Course and Reassessment: She was initially evaluated in triage and had labs performed that showed mild hypokalemia of 3.2, otherwise at her baseline.  Pregnancy test was negative.  She was given potassium repletion.  At the time of my evaluation, the  patient reports mild dizziness.  She was able to ambulate steadily in the room and had orthostatic vitals that were stable with no increase in heart rate or drop of blood pressure.  Has no nystagmus or reproducible symptoms on exam and no focal neurologic deficits making central or peripheral vertigo unlikely cause of her dizziness.  Patient is stable for discharge home.  She has  primary care follow-up scheduled.  She was given strict return precautions.  Independent labs interpretation:  The following labs were independently interpreted: Mild hypokalemia otherwise at baseline  Independent visualization of imaging: N/A  Consultation: - Consulted or discussed management/test interpretation w/ external professional: N/A  Consideration for admission or further workup: Patient has no emergent conditions requiring admission or further work-up at this time and is stable for discharge with primary care follow-up Social Determinants of health: N/A    Amount and/or Complexity of Data Reviewed Labs: ordered.  Risk Prescription drug management.          Final Clinical Impression(s) / ED Diagnoses Final diagnoses:  Dizziness  Hypokalemia    Rx / DC Orders ED Discharge Orders     None         Rexford Maus, DO 03/04/22 2259

## 2022-03-04 NOTE — ED Triage Notes (Signed)
Patient here POV from Home.  Endorses Dizziness that began Monday. Admitted with Mastitis recently and discharged from Hospital. Sister at Bedside states she has also been confused in that she has had some lapse in memory.   States after Discharge from Hospital Symptoms became better but did not full resolve and became worse in the past few hours.   No Known Fevers recently.   NAD Noted during Triage. A&Ox4 Currently. GCS 15. BIB Wheelchair.

## 2022-03-04 NOTE — ED Notes (Addendum)
Patient ambulatory to bathroom with steady gait and independently. Urine sample obtained and is at bedside.

## 2022-03-06 LAB — CULTURE, BLOOD (ROUTINE X 2)
Culture: NO GROWTH
Culture: NO GROWTH
Special Requests: ADEQUATE

## 2022-03-17 ENCOUNTER — Inpatient Hospital Stay: Payer: Medicaid Other | Admitting: Nurse Practitioner

## 2022-04-19 ENCOUNTER — Inpatient Hospital Stay: Payer: Medicaid Other | Admitting: Nurse Practitioner

## 2022-06-18 ENCOUNTER — Emergency Department (HOSPITAL_COMMUNITY)
Admission: EM | Admit: 2022-06-18 | Discharge: 2022-06-19 | Disposition: A | Discharge status: Home / Self Care | Payer: 59 | Attending: Emergency Medicine | Admitting: Emergency Medicine

## 2022-06-18 ENCOUNTER — Other Ambulatory Visit: Payer: Self-pay

## 2022-06-18 DIAGNOSIS — R519 Headache, unspecified: Secondary | ICD-10-CM | POA: Diagnosis not present

## 2022-06-18 DIAGNOSIS — K0889 Other specified disorders of teeth and supporting structures: Secondary | ICD-10-CM

## 2022-06-18 NOTE — ED Provider Notes (Incomplete)
  [facility] Provider Note   CSN: [id number] Arrival date & time: 06/18/22  2211     History {Add pertinent medical, surgical, social history, OB history to HPI:1} Chief Complaint  Patient presents with  . Dental Pain    April Crane is a 32 y.o. female.  HPI     Home Medications Prior to Admission medications   Medication Sig Start Date End Date Taking? Authorizing Provider  acetaminophen (TYLENOL) 325 MG tablet Take 2 tablets (650 mg total) by mouth every 6 (six) hours as needed for mild pain, fever, headache or moderate pain. 03/03/22   Oswald Hillock, MD  naproxen sodium (ALEVE) 220 MG tablet Take 440 mg by mouth 2 (two) times daily as needed (pain).    [provider]  sulfamethoxazole-trimethoprim (BACTRIM DS) 800-160 MG tablet Take 1 tablet by mouth 2 (two) times daily. 03/03/22   Oswald Hillock, MD      Allergies    Flagyl [metronidazole], Tetracycline, Tetracyclines & related, Diflucan [fluconazole], Ibuprofen, and Other    Review of Systems   Review of Systems  Physical Exam Updated Vital Signs BP (!) 139/100   Pulse 73   Temp 98.4 F (36.9 C) (Oral)   Resp 20   Ht 5\' 2"  (1.575 m)   Wt 83.5 kg   LMP 06/02/2022   SpO2 100%   BMI 33.65 kg/m  Physical Exam  ED Results / Procedures / Treatments   Labs (all labs ordered are listed, but only abnormal results are displayed) Labs Reviewed - No data to display  EKG None  Radiology No results found.  Procedures Procedures  {Document cardiac monitor, telemetry assessment procedure when appropriate:1}  Medications Ordered in ED Medications - No data to display  ED Course/ Medical Decision Making/ A&P   {   Click here for ABCD2, HEART and other calculatorsREFRESH Note before signing :1}                          Medical Decision Making  ***  {Document critical care time when appropriate:1} {Document review of labs and clinical decision  tools ie heart score, Chads2Vasc2 etc:1}  {Document your independent review of radiology images, and any outside records:1} {Document your discussion with family members, caretakers, and with consultants:1} {Document social determinants of health affecting pt's care:1} {Document your decision making why or why not admission, treatments were needed:1} Final Clinical Impression(s) / ED Diagnoses Final diagnoses:  None    Rx / DC Orders ED Discharge Orders     None

## 2022-06-18 NOTE — ED Provider Notes (Signed)
[facility] Provider Note   CSN: [id number] Arrival date & time: 06/18/22  2211     History  Chief Complaint  Patient presents with   Dental Pain    April Crane is a 32 y.o. female who presents 8 days after wisdom tooth removal on the left with concern for ongoing left-sided facial pain.  No facial swelling, no with difficulty swallowing, no fevers or chills, no nausea vomiting.  Patient states has been taking Tylenol ibuprofen but only takes 1 Tylenol at bedtime and has been taking 2 ibuprofen in the morning, pain is uncontrolled.    I reviewed her medical records.  She is history of sepsis secondary mastitis, currently is lactating.  HPI     Home Medications Prior to Admission medications   Medication Sig Start Date End Date Taking? Authorizing Provider  amoxicillin-clavulanate (AUGMENTIN) 875-125 MG tablet Take 1 tablet by mouth every 12 (twelve) hours. 06/19/22  Yes Heywood Tokunaga, Gypsy Balsam, PA-C  acetaminophen (TYLENOL) 325 MG tablet Take 2 tablets (650 mg total) by mouth every 6 (six) hours as needed for mild pain, fever, headache or moderate pain. 03/03/22   Oswald Hillock, MD  naproxen sodium (ALEVE) 220 MG tablet Take 440 mg by mouth 2 (two) times daily as needed (pain).    [provider]  sulfamethoxazole-trimethoprim (BACTRIM DS) 800-160 MG tablet Take 1 tablet by mouth 2 (two) times daily. 03/03/22   Oswald Hillock, MD      Allergies    Flagyl [metronidazole], Tetracycline, Tetracyclines & related, Diflucan [fluconazole], Ibuprofen, and Other    Review of Systems   Review of Systems  Constitutional:  Negative for fever.  HENT:  Positive for dental problem. Negative for trouble swallowing and voice change.     Physical Exam Updated Vital Signs BP (!) 139/100   Pulse 73   Temp 98.4 F (36.9 C) (Oral)   Resp 20   Ht 5' 2"$  (1.575 m)   Wt 83.5 kg   LMP 06/02/2022   SpO2 100%   BMI 33.65 kg/m  Physical  Exam Vitals and nursing note reviewed.  Constitutional:      Appearance: She is obese. She is not ill-appearing or toxic-appearing.  HENT:     Head: Normocephalic and atraumatic.     Mouth/Throat:      Comments: No sublingual or submental tenderness to palpation. No facial swelling Eyes:     General: No scleral icterus.       Right eye: No discharge.        Left eye: No discharge.     Conjunctiva/sclera: Conjunctivae normal.  Neck:     Trachea: Trachea and phonation normal.  Pulmonary:     Effort: Pulmonary effort is normal.  Musculoskeletal:     Cervical back: No edema, rigidity or crepitus. No pain with movement, spinous process tenderness or muscular tenderness. Normal range of motion.  Lymphadenopathy:     Cervical: No cervical adenopathy.  Skin:    General: Skin is warm and dry.  Neurological:     General: No focal deficit present.     Mental Status: She is alert.  Psychiatric:        Mood and Affect: Mood normal.     ED Results / Procedures / Treatments   Labs (all labs ordered are listed, but only abnormal results are displayed) Labs Reviewed - No data to display  EKG None  Radiology No results found.  Procedures Procedures  Medications Ordered in ED Medications  oxyCODONE-acetaminophen (PERCOCET/ROXICET) 5-325 MG per tablet 1 tablet (1 tablet Oral Given 06/19/22 0028)  amoxicillin-clavulanate (AUGMENTIN) 875-125 MG per tablet 1 tablet (1 tablet Oral Given 06/19/22 0029)    ED Course/ Medical Decision Making/ A&P                             Medical Decision Making 32 year old female presents with ongoing pain following wisdom tooth removal approximately 8 days ago  Hypertensive on intake, vital signs otherwise normal.  Cardiopulmonary dam is normal, abdominal exam is benign.  Oropharyngeal exam as above.  Not convincing for infection though this cannot be completely ruled out at site of wisdom tooth extraction.  No evidence of sublingual or  submental tenderness palpation to suggest Ludwig's angina.  No evidence of oropharyngeal abscess on exam.   Will empirically start patient on antibiotic coverage for dental infection for possible brewing infectious contribution to her pain.  Single dose of pain medication administered in the ED.  Did educate the patient regarding appropriate dosing schedule for ibuprofen and Tylenol to manage her pain at home.  Recommend she call her dentist for follow-up appointment.  She has not attempted to contact them regarding her pain.  Clinical concern for emergent underlying etiology of this patient symptoms that would warrant further ED workup or inpatient management is exceedingly low. April Crane  voiced understanding of her medical evaluation and treatment plan. Each of their questions answered to their expressed satisfaction.  Return precautions were given.  Patient is well-appearing, stable, and was discharged in good condition.  This chart was dictated using voice recognition software, Dragon. Despite the best efforts of this provider to proofread and correct errors, errors may still occur which can change documentation meaning.   Final Clinical Impression(s) / ED Diagnoses Final diagnoses:  Pain, dental    Rx / DC Orders ED Discharge Orders          Ordered    amoxicillin-clavulanate (AUGMENTIN) 875-125 MG tablet  Every 12 hours        06/19/22 0023              Carling Liberman, Gypsy Balsam, PA-C 06/19/22 0040    Quintella Reichert, MD 06/20/22 [phone]

## 2022-06-18 NOTE — ED Triage Notes (Signed)
Pt states upper and lower left sided dental pain radiating to left side of face ongoing since last Friday after wisdom teeth extraction. No facial swelling. Was recommended tylenol/ibuprofen for pain management. No relief. Last taken tonight at 8 pm.

## 2022-06-19 MED ORDER — OXYCODONE-ACETAMINOPHEN 5-325 MG PO TABS
1.0000 | ORAL_TABLET | Freq: Once | ORAL | Status: AC
  Administered 2022-06-19: 1 via ORAL
  Filled 2022-06-19: qty 1

## 2022-06-19 MED ORDER — AMOXICILLIN-POT CLAVULANATE 875-125 MG PO TABS
1.0000 | ORAL_TABLET | Freq: Once | ORAL | Status: AC
  Administered 2022-06-19: 1 via ORAL
  Filled 2022-06-19: qty 1

## 2022-06-19 MED ORDER — AMOXICILLIN-POT CLAVULANATE 875-125 MG PO TABS: 1.0000 | ORAL_TABLET | Freq: Two times a day (BID) | ORAL | 0 refills | Status: DC

## 2022-06-19 NOTE — Discharge Instructions (Signed)
Please take entire course of antibiotics as directed.  Continue using ibuprofen / Tylenol, as well as prescribed Orajel for pain.  You will need to follow-up with your dentist for continued management of this. Please see dental resources below. Return to the emergency department for fevers, swelling or pain under the tongue or in the neck, difficulty breathing or swallowing, nausea or vomiting that does not stop, or any other new or concerning symptoms.

## 2022-07-02 IMAGING — DX DG WRIST COMPLETE 3+V*R*
4 series · 4 of 4 positions shown · non-contrast
Comparison: None Available.

CLINICAL DATA: Right wrist pain.  Injury due to fall.

EXAM:
RIGHT WRIST - COMPLETE 3+ VIEW

[dg wrist complete right (1 of 4)]
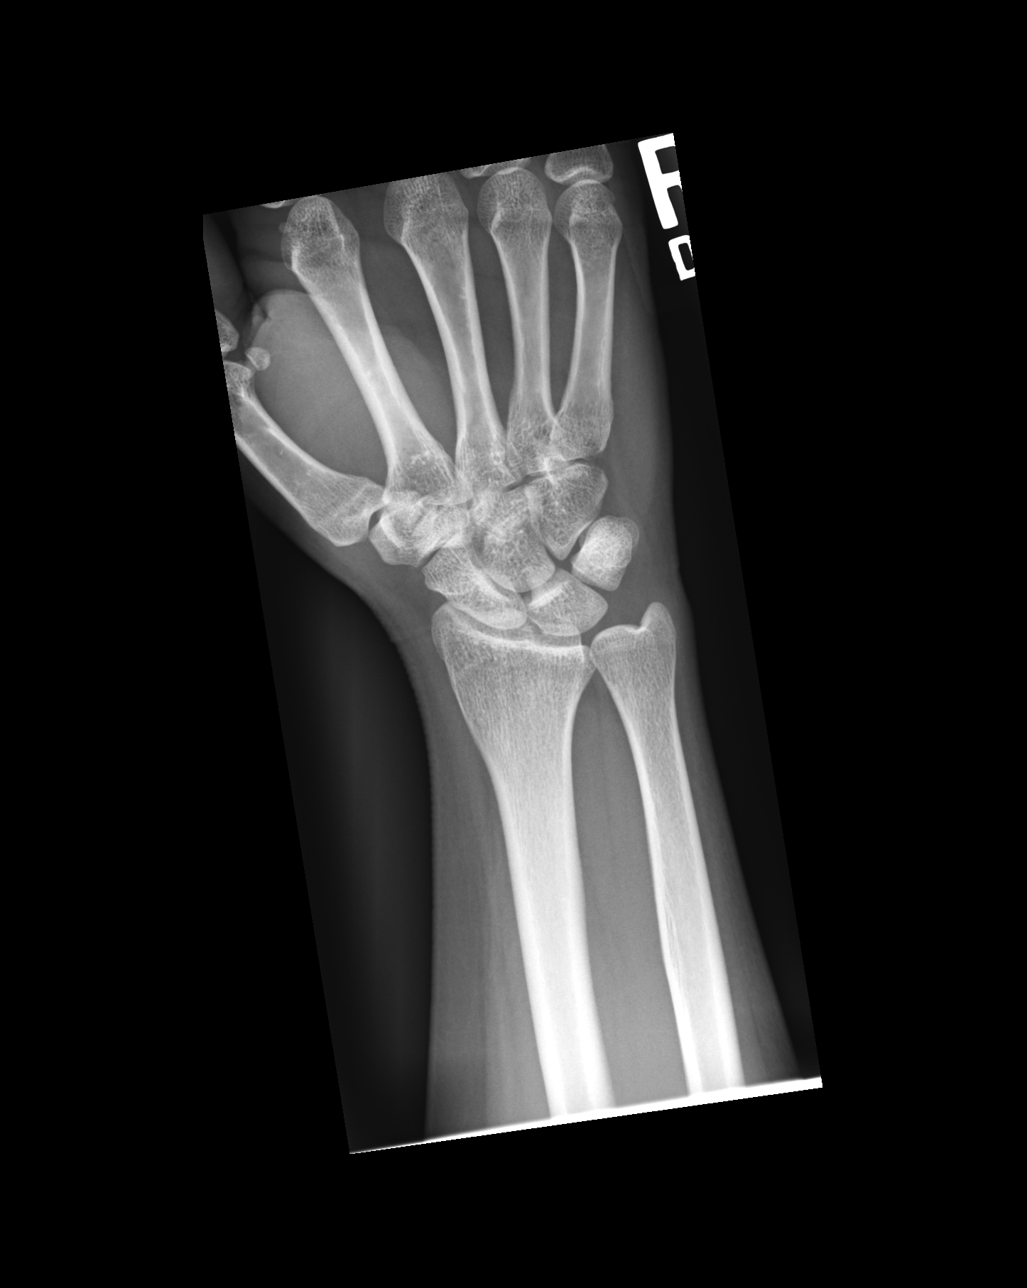

[dg wrist complete right (2 of 4)]
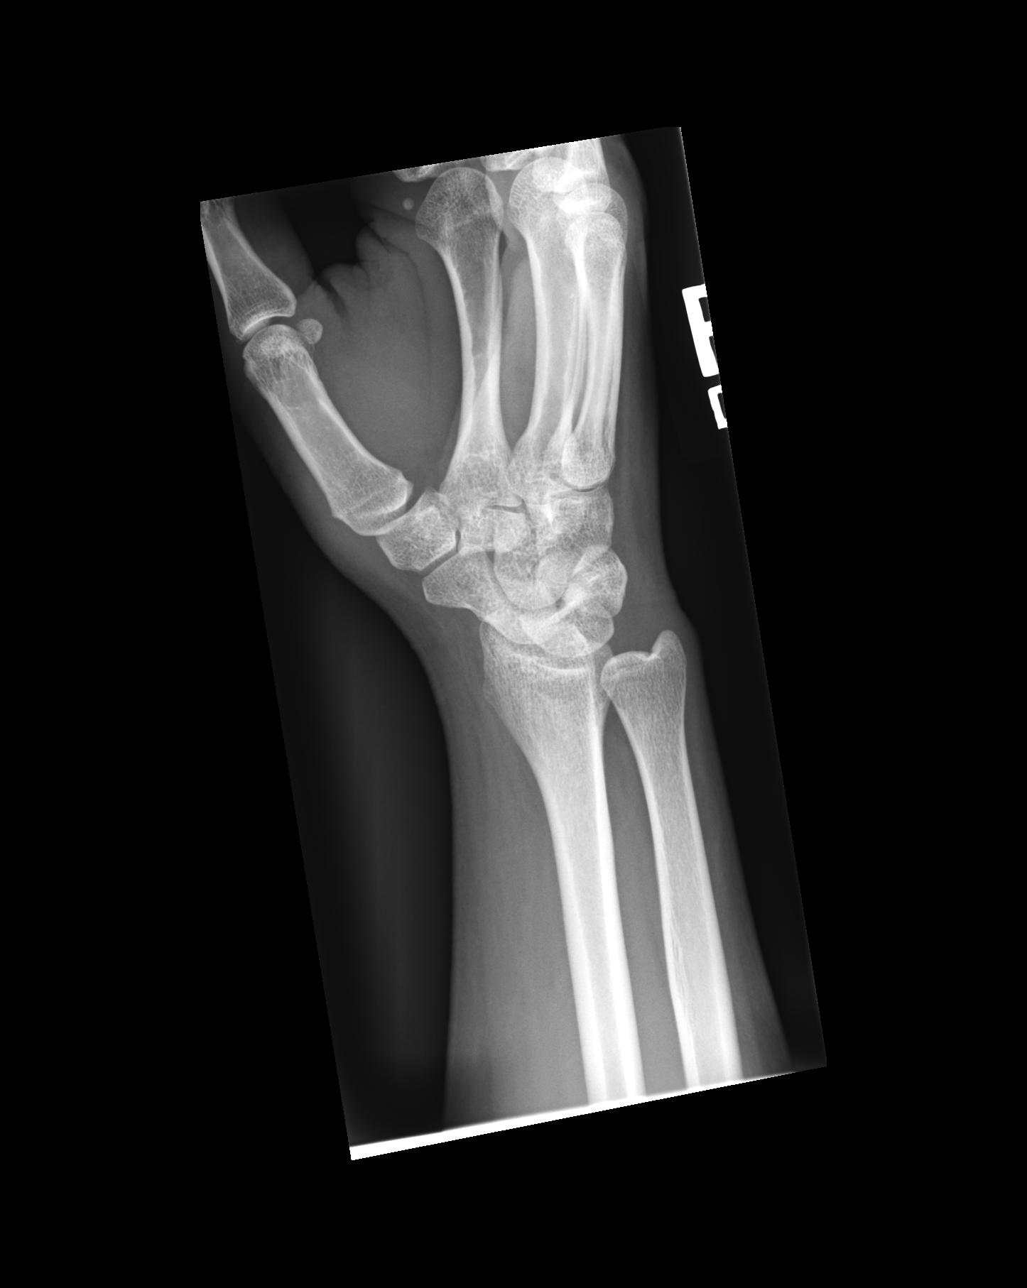

[dg wrist complete right (3 of 4)]
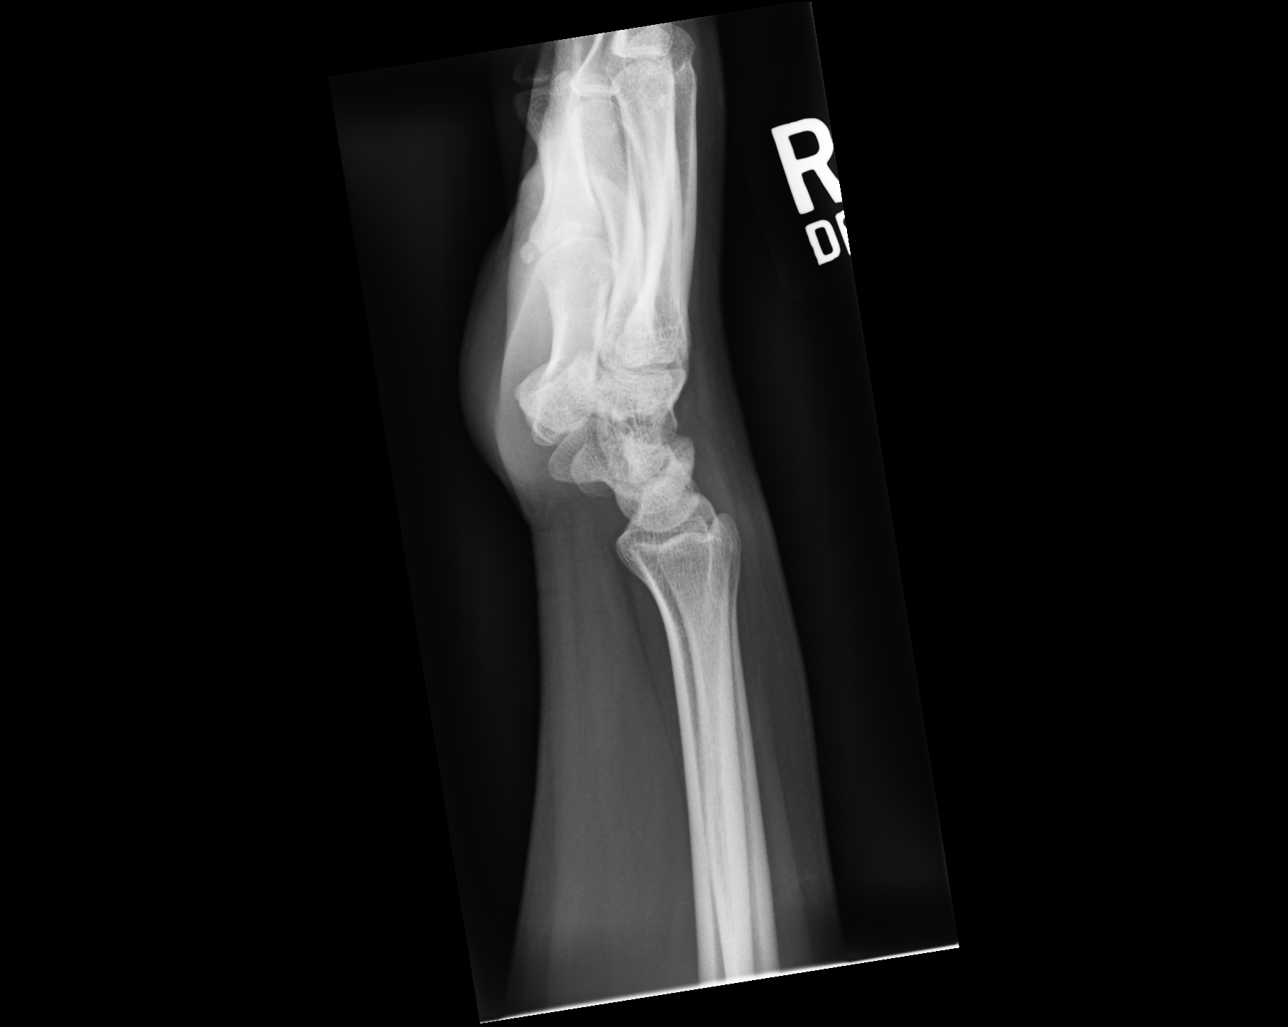

[dg wrist complete right (4 of 4)]
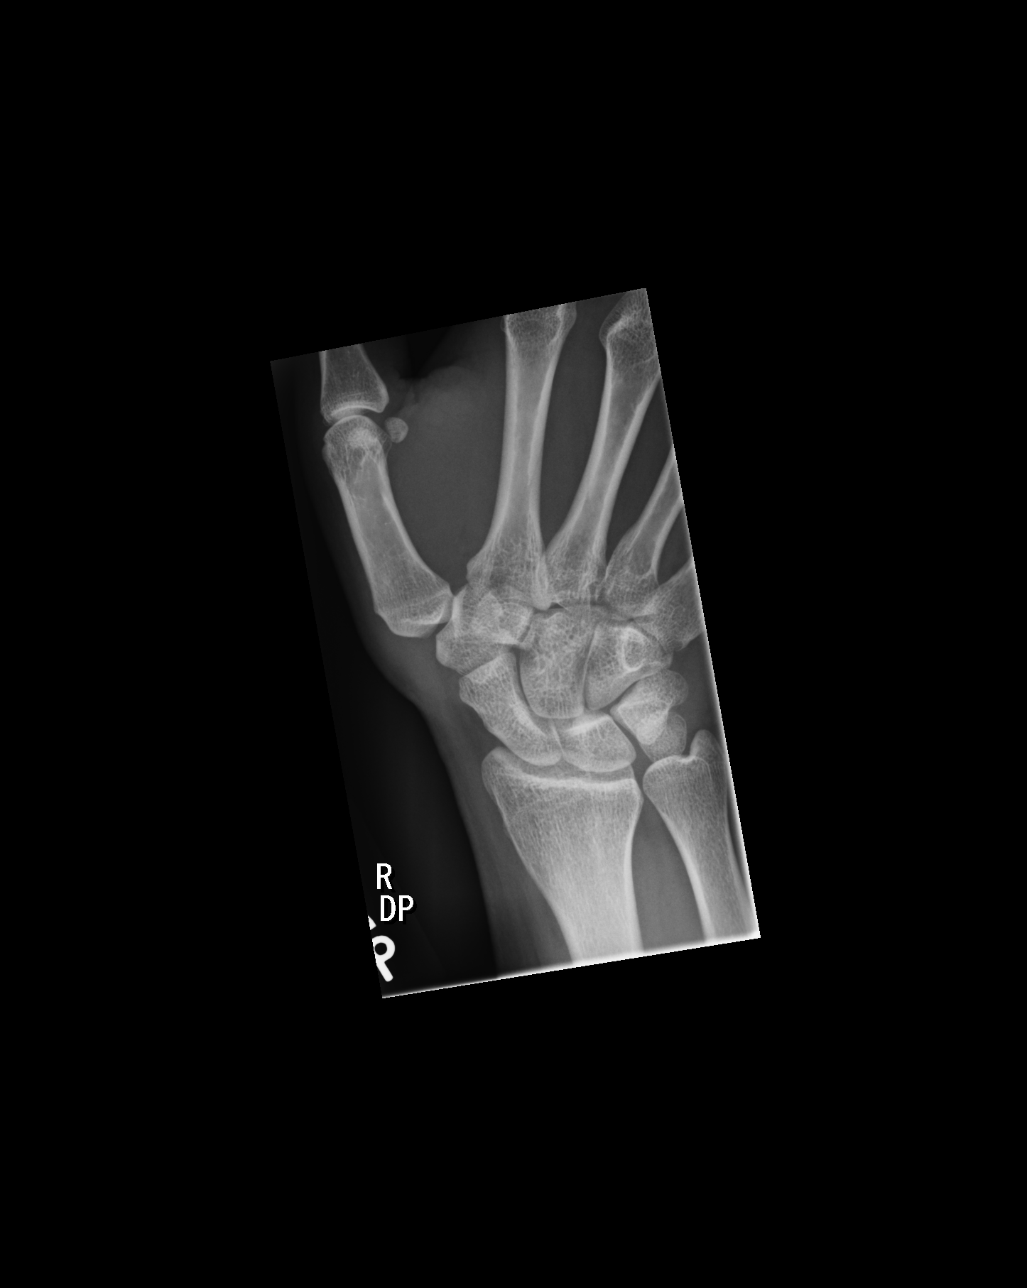

[4 of 4 positions shown; findings below may reference images not displayed]

FINDINGS: Mild developmental ulnar positive variance. Joint spaces are
preserved. No acute fracture is seen. No dislocation.
IMPRESSION: Normal wrist radiographs.

## 2022-07-05 DIAGNOSIS — Z87448 Personal history of other diseases of urinary system: Secondary | ICD-10-CM | POA: Diagnosis not present

## 2022-07-05 DIAGNOSIS — Z889 Allergy status to unspecified drugs, medicaments and biological substances status: Secondary | ICD-10-CM | POA: Diagnosis not present

## 2022-07-05 DIAGNOSIS — Z789 Other specified health status: Secondary | ICD-10-CM | POA: Diagnosis not present

## 2022-07-05 DIAGNOSIS — Z0184 Encounter for antibody response examination: Secondary | ICD-10-CM | POA: Diagnosis not present

## 2022-07-05 DIAGNOSIS — Z8719 Personal history of other diseases of the digestive system: Secondary | ICD-10-CM | POA: Diagnosis not present

## 2022-07-05 DIAGNOSIS — Z01419 Encounter for gynecological examination (general) (routine) without abnormal findings: Secondary | ICD-10-CM | POA: Diagnosis not present

## 2022-07-05 DIAGNOSIS — Z872 Personal history of diseases of the skin and subcutaneous tissue: Secondary | ICD-10-CM | POA: Diagnosis not present

## 2022-07-05 DIAGNOSIS — A609 Anogenital herpesviral infection, unspecified: Secondary | ICD-10-CM | POA: Diagnosis not present

## 2022-07-10 ENCOUNTER — Ambulatory Visit (HOSPITAL_COMMUNITY)
Admission: EM | Admit: 2022-07-10 | Discharge: 2022-07-10 | Disposition: A | Discharge status: Home / Self Care | Payer: 59 | Attending: Nurse Practitioner | Admitting: Nurse Practitioner

## 2022-07-10 ENCOUNTER — Encounter (HOSPITAL_COMMUNITY): Payer: Self-pay

## 2022-07-10 DIAGNOSIS — L02411 Cutaneous abscess of right axilla: Secondary | ICD-10-CM

## 2022-07-10 MED ORDER — SULFAMETHOXAZOLE-TRIMETHOPRIM 800-160 MG PO TABS: 1.0000 | ORAL_TABLET | Freq: Two times a day (BID) | ORAL | 0 refills | Status: AC

## 2022-07-10 NOTE — Discharge Instructions (Addendum)
You have abscess. You have been prescribed Bactrim twice a day for 7 day.  Wound culture is pending.  Your results will be in your MyChart. You will be notified if any additional recommendations for your care.

## 2022-07-10 NOTE — ED Triage Notes (Signed)
Pts states that she has 3 knots under left arm. 3 days. Painful. Getting larger. Hasn't taken anything for pain.

## 2022-07-10 NOTE — ED Provider Notes (Signed)
[facility]    CSN: [id number] Arrival date & time: 07/10/22  1315      History   Chief Complaint Chief Complaint  Patient presents with   knot under arm    HPI April Crane is a 32 y.o. female.   HPI  She is in today with knot to her right armpit. She reports that this has been ongoing for several days. She denies hx of abscess or diabetes. Denies fever, chills, nausea, vomiting. Past Medical History:  Diagnosis Date   Asthma    Headache    No pertinent past medical history    Obstruction of colon ([place])    Renal disorder     Patient Active Problem List   Diagnosis Date Noted   Community acquired pneumonia 03/02/2022   Sepsis ([place]) 03/02/2022   Mastitis, left, acute 03/02/2022   Hypotension 03/02/2022   Mastoiditis 03/02/2022   Mastitis 03/02/2022   Cesarean delivery delivered 03/27/2021   Normal labor and delivery 03/26/2021   Post-dates pregnancy 03/26/2021   Drug allergy 04/10/2020   Ganglion cyst of dorsum of left wrist 03/01/2019   Pain of joint of left ankle and foot 03/01/2019   Chest pain    Abdominal pain 07/14/2015   Generalized weakness 07/14/2015   AKI (acute kidney injury) ([place]) 07/13/2015   Acute renal failure (ARF) ([place]) 07/13/2015    Past Surgical History:  Procedure Laterality Date   ANTERIOR CRUCIATE LIGAMENT REPAIR     ANTERIOR CRUCIATE LIGAMENT REPAIR Left    CESAREAN SECTION N/A 03/26/2021   Procedure: CESAREAN SECTION;  Surgeon: Waymon Amato, MD;  Location: [facility];  Service: Obstetrics;  Laterality: N/A;    OB History     Gravida  2   Para  1   Term  1   Preterm      AB      Living  1      SAB      IAB      Ectopic      Multiple  0   Live Births  1            Home Medications    Prior to Admission medications   Medication Sig Start Date End Date Taking? Authorizing Provider  sulfamethoxazole-trimethoprim (BACTRIM DS) 800-160 MG tablet Take 1 tablet by mouth 2 (two) times daily for 7  days. 07/10/22 07/17/22 Yes Vevelyn Francois, NP    Family History Family History  Problem Relation Age of Onset   Asthma Sister    Cancer Maternal Grandmother    Allergic rhinitis Neg Hx    Angioedema Neg Hx    Atopy Neg Hx    Eczema Neg Hx    Immunodeficiency Neg Hx    Urticaria Neg Hx     Social History Social History   Tobacco Use   Smoking status: Never   Smokeless tobacco: Never  Vaping Use   Vaping Use: Never used  Substance Use Topics   Alcohol use: No   Drug use: No     Allergies   Flagyl [metronidazole], Tetracycline, Tetracyclines & related, Diflucan [fluconazole], Ibuprofen, and Other   Review of Systems Review of Systems   Physical Exam Triage Vital Signs ED Triage Vitals  Enc Vitals Group     BP 07/10/22 1421 107/73     Pulse Rate 07/10/22 1421 86     Resp 07/10/22 1421 16     Temp 07/10/22 1421 98 F (36.7 C)  Temp Source 07/10/22 1421 Oral     SpO2 07/10/22 1421 97 %     Weight 07/10/22 1420 184 lb (83.5 kg)     Height --      Head Circumference --      Peak Flow --      Pain Score 07/10/22 1419 10     Pain Loc --      Pain Edu? --      Excl. in [place]? --    No data found.  Updated Vital Signs BP 107/73 (BP Location: Left Arm)   Pulse 86   Temp 98 F (36.7 C) (Oral)   Resp 16   Wt 184 lb (83.5 kg)   LMP 06/29/2022   SpO2 97%   BMI 33.65 kg/m   Visual Acuity Right Eye Distance:   Left Eye Distance:   Bilateral Distance:    Right Eye Near:   Left Eye Near:    Bilateral Near:     Physical Exam Constitutional:      General: She is not in acute distress.    Appearance: She is obese. She is not ill-appearing, toxic-appearing or diaphoretic.  Cardiovascular:     Rate and Rhythm: Normal rate.  Pulmonary:     Effort: Pulmonary effort is normal.  Musculoskeletal:     Cervical back: Normal range of motion.  Skin:    General: Skin is warm.     Capillary Refill: Capillary refill takes less than 2 seconds.     Findings:  Abscess present.     Comments: Thick yellow-green drainage to right axilla.   Neurological:     General: No focal deficit present.     Mental Status: She is alert and oriented to person, place, and time.      UC Treatments / Results  Labs (all labs ordered are listed, but only abnormal results are displayed) Labs Reviewed  AEROBIC CULTURE W GRAM STAIN (SUPERFICIAL SPECIMEN)    EKG   Radiology No results found.  Procedures Procedures (including critical care time)  Medications Ordered in UC Medications - No data to display  Initial Impression / Assessment and Plan / UC Course  I have reviewed the triage vital signs and the nursing notes.  Pertinent labs & imaging results that were available during my care of the patient were reviewed by me and considered in my medical decision making (see chart for details).     boil Final Clinical Impressions(s) / UC Diagnoses   Final diagnoses:  Abscess of axilla, right     Discharge Instructions      You have abscess. You have been prescribed Bactrim twice a day for 7 day.  Wound culture is pending.  Your results will be in your MyChart. You will be notified if any additional recommendations for your care.       ED Prescriptions     Medication Sig Dispense Auth. Provider   sulfamethoxazole-trimethoprim (BACTRIM DS) 800-160 MG tablet Take 1 tablet by mouth 2 (two) times daily for 7 days. 14 tablet Vevelyn Francois, NP      PDMP not reviewed this encounter.   Dionisio David [place], [place] 07/12/22 [phone]

## 2022-07-13 LAB — AEROBIC CULTURE W GRAM STAIN (SUPERFICIAL SPECIMEN): Gram Stain: NONE SEEN

## 2022-10-11 ENCOUNTER — Encounter (HOSPITAL_COMMUNITY): Payer: Self-pay | Admitting: Emergency Medicine

## 2022-10-11 ENCOUNTER — Ambulatory Visit (HOSPITAL_COMMUNITY)
Admission: EM | Admit: 2022-10-11 | Discharge: 2022-10-11 | Disposition: A | Discharge status: Home / Self Care | Payer: 59 | Attending: Internal Medicine | Admitting: Internal Medicine

## 2022-10-11 DIAGNOSIS — B9629 Other Escherichia coli [E. coli] as the cause of diseases classified elsewhere: Secondary | ICD-10-CM | POA: Diagnosis not present

## 2022-10-11 DIAGNOSIS — N898 Other specified noninflammatory disorders of vagina: Secondary | ICD-10-CM | POA: Diagnosis not present

## 2022-10-11 DIAGNOSIS — Z113 Encounter for screening for infections with a predominantly sexual mode of transmission: Secondary | ICD-10-CM | POA: Diagnosis not present

## 2022-10-11 DIAGNOSIS — M545 Low back pain, unspecified: Secondary | ICD-10-CM

## 2022-10-11 DIAGNOSIS — R8290 Unspecified abnormal findings in urine: Secondary | ICD-10-CM

## 2022-10-11 DIAGNOSIS — N3001 Acute cystitis with hematuria: Secondary | ICD-10-CM | POA: Diagnosis not present

## 2022-10-11 LAB — POCT URINALYSIS DIP (MANUAL ENTRY)
Bilirubin, UA: NEGATIVE
Glucose, UA: NEGATIVE mg/dL
Ketones, POC UA: NEGATIVE mg/dL
Nitrite, UA: POSITIVE — AB
Protein Ur, POC: NEGATIVE mg/dL
Spec Grav, UA: 1.025 (ref 1.010–1.025)
Urobilinogen, UA: 0.2 E.U./dL
pH, UA: 5.5 (ref 5.0–8.0)

## 2022-10-11 MED ORDER — CEFDINIR 300 MG PO CAPS: 300.0000 mg | ORAL_CAPSULE | Freq: Two times a day (BID) | ORAL | 0 refills | Status: AC

## 2022-10-11 NOTE — ED Triage Notes (Signed)
Pt c/o abd pains 2 weeks C/o Back pain for week. Pain worse with certain situations.  Pt reports has bad odor when urinating for a week. Denies pain or blood in urine.

## 2022-10-11 NOTE — ED Provider Notes (Signed)
[facility]   Note:  This document was prepared using Dragon voice recognition software and may include unintentional dictation errors.  MRN: [id number] DOB: 05-Dec-1990 DATE: 10/11/22   Subjective:  Chief Complaint:  Chief Complaint  Patient presents with   Back Pain    HPI: April Crane is a 32 y.o. female presenting for low back pain for 2 weeks.  She states she has not tried any for the pain.  No known injury.  She is also noticed that her urine has been cloudy and malodorous for about a week.  She denies dysuria, but some lower abdominal pain last week that has since resolved.  She has also had an increase in vaginal discharge.  The discharge is clear and malodorous. Denies fever, nausea/vomiting, dysuria. Endorses low back pain and malodorous urine. Presents NAD.  Prior to Admission medications   Not on File     Allergies  Allergen Reactions   Flagyl [Metronidazole] Anaphylaxis   Tetracycline Anaphylaxis    Other reaction(s): anaphylaxis, Anaphylaxis   Tetracyclines & Related Anaphylaxis   Diflucan [Fluconazole] Swelling   Ibuprofen Other (See Comments)    Damaged kidneys   Other Nausea And Vomiting    Antibiotic (patient cannot recall name/elementary school)    History:   Past Medical History:  Diagnosis Date   Asthma    Headache    No pertinent past medical history    Obstruction of colon ([facility])    Renal disorder      Past Surgical History:  Procedure Laterality Date   ANTERIOR CRUCIATE LIGAMENT REPAIR     ANTERIOR CRUCIATE LIGAMENT REPAIR Left    CESAREAN SECTION N/A 03/26/2021   Procedure: CESAREAN SECTION;  Surgeon: Hoover Browns, MD;  Location: [facility]S;  Service: Obstetrics;  Laterality: N/A;    Family History  Problem Relation Age of Onset   Asthma Sister    Cancer Maternal Grandmother    Allergic rhinitis Neg Hx    Angioedema Neg Hx    Atopy Neg Hx    Eczema Neg Hx    Immunodeficiency Neg Hx    Urticaria Neg Hx     Social  History   Tobacco Use   Smoking status: Never   Smokeless tobacco: Never  Vaping Use   Vaping Use: Never used  Substance Use Topics   Alcohol use: No   Drug use: No    Review of Systems  Constitutional:  Negative for fever.  Gastrointestinal:  Positive for abdominal pain. Negative for nausea and vomiting.  Genitourinary:  Positive for vaginal discharge. Negative for dysuria.  Musculoskeletal:  Positive for back pain.     Objective:   Vitals: BP 118/84 (BP Location: Right Arm)   Pulse 92   Temp 99.1 F (37.3 C) (Oral)   Resp 14   LMP 09/21/2022   SpO2 99%   Physical Exam Constitutional:      General: She is not in acute distress.    Appearance: Normal appearance. She is well-developed. She is obese. She is not ill-appearing or toxic-appearing.  HENT:     Head: Normocephalic and atraumatic.  Cardiovascular:     Rate and Rhythm: Normal rate and regular rhythm.     Heart sounds: Normal heart sounds.  Pulmonary:     Effort: Pulmonary effort is normal.     Breath sounds: Normal breath sounds.  Abdominal:     General: Bowel sounds are normal.     Palpations: Abdomen is soft.  Tenderness: There is no abdominal tenderness. There is no right CVA tenderness or left CVA tenderness.  Musculoskeletal:     Lumbar back: Normal.  Skin:    General: Skin is warm and dry.  Neurological:     General: No focal deficit present.     Mental Status: She is alert.  Psychiatric:        Mood and Affect: Mood and affect normal.     Results:  Labs: Results for orders placed or performed during the hospital encounter of 10/11/22 (from the past 24 hour(s))  POC urinalysis dipstick     Status: Abnormal   Collection Time: 10/11/22  8:43 PM  Result Value Ref Range   Color, UA yellow yellow   Clarity, UA clear clear   Glucose, UA negative negative mg/dL   Bilirubin, UA negative negative   Ketones, POC UA negative negative mg/dL   Spec Grav, UA 1.610 9.604 - 1.025   Blood, UA  trace-intact (A) negative   pH, UA 5.5 5.0 - 8.0   Protein Ur, POC negative negative mg/dL   Urobilinogen, UA 0.2 0.2 or 1.0 E.U./dL   Nitrite, UA Positive (A) Negative   Leukocytes, UA Moderate (2+) (A) Negative    Radiology: No results found.   UC Course/Treatments:  Procedures: Procedures   Medications Ordered in UC: Medications - No data to display   Assessment and Plan :     ICD-10-CM   1. Acute cystitis with hematuria  N30.01     2. Acute bilateral low back pain without sciatica  M54.50     3. Malodorous urine  R82.90      Acute cystitis with hematuria Afebrile, nontoxic-appearing, NAD. VSS. DDX includes but not limited to: Cystitis, pyelonephritis, nephrolithiasis, strain UA was positive for trace RBCs, leukocytes, and nitrates today in office.  Urine culture is pending.  UPT was negative.  Cefdinir 300 mg twice daily was prescribed. Strict ED precautions were given and patient verbalized understanding.  Acute bilateral low back pain without sciatica Suspect secondary to cystitis.  See notes above. Recommend over-the-counter analgesics as needed for pain.  Strict ED precautions were given and patient verbalized understanding.  Malodorous urine Suspect secondary to cystitis.  See notes above  ED Discharge Orders          Ordered    cefdinir (OMNICEF) 300 MG capsule  2 times daily        10/11/22 2101             PDMP not reviewed this encounter.     Cynda Acres, PA-C 10/11/22 2117

## 2022-10-11 NOTE — Discharge Instructions (Signed)
Your urine sample was consistent with a urinary tract infection. You were prescribed cefdinir which is an antibiotic that is often used to treat urinary tract infections.  Take the prescription as directed. A urine culture has been sent to the lab for further testing.  We will call you with those results.  If the prescription needs to be changed, it will be done so at that time.   Return in 2 to 3 days if no improvement. Please go directly to the Emergency Department immediately should you begin to have any of the following symptoms: persistent fevers, increased pain or persistent nausea/vomiting.

## 2022-10-12 LAB — CERVICOVAGINAL ANCILLARY ONLY
Bacterial Vaginitis (gardnerella): POSITIVE — AB
Candida Glabrata: NEGATIVE
Candida Vaginitis: NEGATIVE
Chlamydia: NEGATIVE
Comment: NEGATIVE
Comment: NEGATIVE
Comment: NEGATIVE
Comment: NEGATIVE
Comment: NEGATIVE
Comment: NORMAL
Neisseria Gonorrhea: NEGATIVE
Trichomonas: NEGATIVE

## 2022-10-13 ENCOUNTER — Telehealth (HOSPITAL_COMMUNITY): Payer: Self-pay | Admitting: Emergency Medicine

## 2022-10-13 LAB — URINE CULTURE: Culture: >=100000 — AB

## 2022-10-13 MED ORDER — CLINDAMYCIN PHOSPHATE 2 % VA CREA: 1.0000 | TOPICAL_CREAM | Freq: Every day | VAGINAL | 0 refills | Status: AC

## 2022-10-19 DIAGNOSIS — N760 Acute vaginitis: Secondary | ICD-10-CM | POA: Diagnosis not present

## 2022-10-20 DIAGNOSIS — Z881 Allergy status to other antibiotic agents status: Secondary | ICD-10-CM | POA: Diagnosis not present

## 2022-10-20 DIAGNOSIS — Z833 Family history of diabetes mellitus: Secondary | ICD-10-CM | POA: Diagnosis not present

## 2022-10-20 DIAGNOSIS — Z91038 Other insect allergy status: Secondary | ICD-10-CM | POA: Diagnosis not present

## 2022-10-20 DIAGNOSIS — Z888 Allergy status to other drugs, medicaments and biological substances status: Secondary | ICD-10-CM | POA: Diagnosis not present

## 2022-10-20 DIAGNOSIS — Z5986 Financial insecurity: Secondary | ICD-10-CM | POA: Diagnosis not present

## 2022-10-20 DIAGNOSIS — N760 Acute vaginitis: Secondary | ICD-10-CM | POA: Diagnosis not present

## 2022-10-20 DIAGNOSIS — A6004 Herpesviral vulvovaginitis: Secondary | ICD-10-CM | POA: Diagnosis not present

## 2022-10-20 DIAGNOSIS — Z886 Allergy status to analgesic agent status: Secondary | ICD-10-CM | POA: Diagnosis not present

## 2022-10-20 DIAGNOSIS — Z6835 Body mass index (BMI) 35.0-35.9, adult: Secondary | ICD-10-CM | POA: Diagnosis not present

## 2022-10-20 DIAGNOSIS — E669 Obesity, unspecified: Secondary | ICD-10-CM | POA: Diagnosis not present

## 2022-10-20 DIAGNOSIS — Z803 Family history of malignant neoplasm of breast: Secondary | ICD-10-CM | POA: Diagnosis not present

## 2023-06-09 DIAGNOSIS — Z833 Family history of diabetes mellitus: Secondary | ICD-10-CM | POA: Diagnosis not present

## 2023-06-09 DIAGNOSIS — Z886 Allergy status to analgesic agent status: Secondary | ICD-10-CM | POA: Diagnosis not present

## 2023-06-09 DIAGNOSIS — Z881 Allergy status to other antibiotic agents status: Secondary | ICD-10-CM | POA: Diagnosis not present

## 2023-06-09 DIAGNOSIS — E669 Obesity, unspecified: Secondary | ICD-10-CM | POA: Diagnosis not present

## 2023-06-09 DIAGNOSIS — Z6832 Body mass index (BMI) 32.0-32.9, adult: Secondary | ICD-10-CM | POA: Diagnosis not present

## 2023-06-09 DIAGNOSIS — M545 Low back pain, unspecified: Secondary | ICD-10-CM | POA: Diagnosis not present

## 2023-06-09 DIAGNOSIS — Z809 Family history of malignant neoplasm, unspecified: Secondary | ICD-10-CM | POA: Diagnosis not present

## 2023-06-18 ENCOUNTER — Emergency Department (HOSPITAL_COMMUNITY)
Admission: EM | Admit: 2023-06-18 | Discharge: 2023-06-18 | Disposition: A | Discharge status: Home / Self Care | Payer: 59

## 2023-06-18 ENCOUNTER — Encounter (HOSPITAL_COMMUNITY): Payer: Self-pay

## 2023-06-18 ENCOUNTER — Other Ambulatory Visit: Payer: Self-pay

## 2023-06-18 DIAGNOSIS — R0981 Nasal congestion: Secondary | ICD-10-CM | POA: Diagnosis present

## 2023-06-18 DIAGNOSIS — Z20822 Contact with and (suspected) exposure to covid-19: Secondary | ICD-10-CM | POA: Insufficient documentation

## 2023-06-18 DIAGNOSIS — J111 Influenza due to unidentified influenza virus with other respiratory manifestations: Secondary | ICD-10-CM

## 2023-06-18 DIAGNOSIS — J101 Influenza due to other identified influenza virus with other respiratory manifestations: Secondary | ICD-10-CM | POA: Diagnosis not present

## 2023-06-18 LAB — RESP PANEL BY RT-PCR (RSV, FLU A&B, COVID)  RVPGX2
Influenza A by PCR: POSITIVE — AB
Influenza B by PCR: NEGATIVE
Resp Syncytial Virus by PCR: NEGATIVE
SARS Coronavirus 2 by RT PCR: NEGATIVE

## 2023-06-18 MED ORDER — ACETAMINOPHEN 500 MG PO TABS
ORAL_TABLET | ORAL | Status: AC
  Administered 2023-06-18: 1000 mg via ORAL
  Filled 2023-06-18: qty 2

## 2023-06-18 MED ORDER — BENZONATATE 100 MG PO CAPS: 100.0000 mg | ORAL_CAPSULE | Freq: Three times a day (TID) | ORAL | 0 refills | Status: AC

## 2023-06-18 MED ORDER — PHENOL 1.4 % MT LIQD: 1.0000 | OROMUCOSAL | 0 refills | Status: AC | PRN

## 2023-06-18 MED ORDER — BENZONATATE 100 MG PO CAPS: 100.0000 mg | ORAL_CAPSULE | Freq: Three times a day (TID) | ORAL | 0 refills | Status: DC

## 2023-06-18 MED ORDER — PHENOL 1.4 % MT LIQD: 1.0000 | OROMUCOSAL | 0 refills | Status: DC | PRN

## 2023-06-18 MED ORDER — ONDANSETRON HCL 4 MG PO TABS: 4.0000 mg | ORAL_TABLET | Freq: Four times a day (QID) | ORAL | 0 refills | Status: AC

## 2023-06-18 MED ORDER — ONDANSETRON HCL 4 MG PO TABS: 4.0000 mg | ORAL_TABLET | Freq: Four times a day (QID) | ORAL | 0 refills | Status: DC

## 2023-06-18 MED ORDER — ACETAMINOPHEN 500 MG PO TABS: 1000.0000 mg | ORAL_TABLET | Freq: Once | ORAL | Status: AC

## 2023-06-18 NOTE — ED Provider Notes (Signed)
 [place] EMERGENCY DEPARTMENT AT [facility] Provider Note   CSN: [id number] Arrival date & time: 06/18/23  1837     History  Chief Complaint  Patient presents with   URI    April Crane is a 33 y.o. female.  33 year old female to emergency department for URI symptoms started yesterday.  Generalized malaise, headache, and congestion, sore throat.  No chest pain no shortness of breath.  No nausea no vomiting.  Does have cough   URI      Home Medications Prior to Admission medications   Medication Sig Start Date End Date Taking? Authorizing Provider  benzonatate  (TESSALON ) 100 MG capsule Take 1 capsule (100 mg total) by mouth every 8 (eight) hours. 06/18/23  Yes Neysa Caron PARAS, DO  ondansetron  (ZOFRAN ) 4 MG tablet Take 1 tablet (4 mg total) by mouth every 6 (six) hours. 06/18/23  Yes Neysa Caron J, DO  phenol (CHLORASEPTIC) 1.4 % LIQD Use as directed 1 spray in the mouth or throat as needed for throat irritation / pain. 06/18/23  Yes Neysa Caron PARAS, DO      Allergies    Flagyl  [metronidazole ], Tetracycline, Tetracyclines & related, Diflucan [fluconazole], Ibuprofen , and Other    Review of Systems   Review of Systems  Physical Exam Updated Vital Signs BP 94/72 (BP Location: Right Arm)   Pulse (!) 123   Temp (!) 103.1 F (39.5 C) (Oral)   Resp 18   Ht 5' 2 (1.575 m)   Wt 83.5 kg   SpO2 100%   BMI 33.65 kg/m  Physical Exam Vitals and nursing note reviewed.  Constitutional:      General: She is not in acute distress.    Appearance: She is not toxic-appearing.  HENT:     Nose: Nose normal.     Mouth/Throat:     Mouth: Mucous membranes are moist.  Eyes:     Conjunctiva/sclera: Conjunctivae normal.  Cardiovascular:     Rate and Rhythm: Normal rate and regular rhythm.  Pulmonary:     Effort: Pulmonary effort is normal.  Abdominal:     General: Abdomen is flat. There is no distension.     Tenderness: There is no abdominal tenderness. There is no  guarding or rebound.  Musculoskeletal:        General: Normal range of motion.  Skin:    General: Skin is warm.     Capillary Refill: Capillary refill takes less than 2 seconds.  Neurological:     Mental Status: She is alert.  Psychiatric:        Mood and Affect: Mood normal.        Behavior: Behavior normal.     ED Results / Procedures / Treatments   Labs (all labs ordered are listed, but only abnormal results are displayed) Labs Reviewed  RESP PANEL BY RT-PCR (RSV, FLU A&B, COVID)  [id number] - Abnormal; Notable for the following components:      Result Value   Influenza A by PCR POSITIVE (*)    All other components within normal limits    EKG None  Radiology No results found.  Procedures Procedures    Medications Ordered in ED Medications  acetaminophen  (TYLENOL ) tablet 1,000 mg (1,000 mg Oral Given 06/18/23 1905)    ED Course/ Medical Decision Making/ A&P Clinical Course as of 06/18/23 2013  Sat Jun 18, 2023  1959 Influenza A By PCR(!): POSITIVE [TY]    Clinical Course User Index [TY] Neysa Caron PARAS, DO  Medical Decision Making Well-appearing 33 year old female presenting emergency department with viral syndrome type complaints.  She is febrile and tachycardic.  Heart rate improved after antipyretics.  She is flu positive here.  Discussed supportive care.  Offered Tamiflu ; after shared decision making patient does not want to take it.  Overall well-appearing.  Clear lungs, low suspicion for pneumonia.  Will forego chest x-ray.  Will give supportive medications.  Stable for discharge at this time.  Amount and/or Complexity of Data Reviewed Labs:  Decision-making details documented in ED Course.  Risk OTC drugs. Prescription drug management.         Final Clinical Impression(s) / ED Diagnoses Final diagnoses:  Influenza    Rx / DC Orders ED Discharge Orders          Ordered    benzonatate  (TESSALON ) 100 MG capsule   Every 8 hours        06/18/23 2011    ondansetron  (ZOFRAN ) 4 MG tablet  Every 6 hours        06/18/23 2011    phenol (CHLORASEPTIC) 1.4 % LIQD  As needed        06/18/23 2011              Neysa Caron PARAS, DO 06/18/23 2013

## 2023-06-18 NOTE — ED Triage Notes (Signed)
 Reports generalized body aches, sore throat, nasal congestion and headache since yesterday.

## 2023-06-18 NOTE — ED Notes (Signed)
 Patient discharged home. VSS. Patient ambulatory out of treatment area with clean and steady gait. All questions answered at time of discharge. Belongings sent home with patient.

## 2023-06-18 NOTE — Discharge Instructions (Addendum)
 You have the flu.  As discussed take over-the-counter Tylenol  alternate with ibuprofen  for fevers and generalized pain.  We are prescribing you some other supportive medications that she can take as needed.  Please follow-up with your primary doctor.

## 2023-10-23 ENCOUNTER — Inpatient Hospital Stay (HOSPITAL_COMMUNITY)
Admission: AD | Admit: 2023-10-23 | Discharge: 2023-10-23 | Disposition: A | Discharge status: Home / Self Care | Payer: Self-pay | Attending: Obstetrics & Gynecology | Admitting: Obstetrics & Gynecology

## 2023-10-23 ENCOUNTER — Other Ambulatory Visit: Payer: Self-pay

## 2023-10-23 DIAGNOSIS — R109 Unspecified abdominal pain: Secondary | ICD-10-CM | POA: Insufficient documentation

## 2023-10-23 DIAGNOSIS — O26891 Other specified pregnancy related conditions, first trimester: Secondary | ICD-10-CM | POA: Insufficient documentation

## 2023-10-23 DIAGNOSIS — Z711 Person with feared health complaint in whom no diagnosis is made: Secondary | ICD-10-CM

## 2023-10-23 DIAGNOSIS — Z3A01 Less than 8 weeks gestation of pregnancy: Secondary | ICD-10-CM | POA: Insufficient documentation

## 2023-10-23 DIAGNOSIS — Z3201 Encounter for pregnancy test, result positive: Secondary | ICD-10-CM | POA: Insufficient documentation

## 2023-10-23 LAB — POCT PREGNANCY, URINE: Preg Test, Ur: POSITIVE — AB

## 2023-10-23 NOTE — MAU Note (Signed)
 April Crane is a 33 y.o. at Unknown here in MAU reporting: +HPT yesterday. Right sided pain that feels dull, comes and goes. Pt said when the pain started she was cleaning. Pt pointed to side of mid section of abdomen. Pain is gone right now. Denies any abdominal cramping or bleeding. Reports nausea nut no vomiting or diarrhea.  LMP: unsure, irregular period last month  Onset of complaint: today Pain score: 0 Vitals:   10/23/23 1610  BP: 111/72  Pulse: 80  Resp: 16  Temp: 98.2 F (36.8 C)     FHT:n/a Lab orders placed from triage:

## 2023-10-23 NOTE — MAU Provider Note (Signed)
 History     CSN: [id number]  Arrival date and time: 10/23/23 1552   None     Chief Complaint  Patient presents with   Abdominal Pain   HPI  [name] is 33 y.o. female G2P1001 @ Unknown here in MAU with complaints of upper right abdominal pain, near her right rib. She was folding clothes, and lifting laudry baskets, clearning her house and started having pain. The pain stopped shortly after she felt it. She has no pain now 0/10. She reports + home pregnancy test yesterday  OB History     Gravida  2   Para  1   Term  1   Preterm      AB      Living  1      SAB      IAB      Ectopic      Multiple  0   Live Births  1           Past Medical History:  Diagnosis Date   Asthma    Headache    No pertinent past medical history    Obstruction of colon (HCC)    Renal disorder     Past Surgical History:  Procedure Laterality Date   ANTERIOR CRUCIATE LIGAMENT REPAIR     ANTERIOR CRUCIATE LIGAMENT REPAIR Left    CESAREAN SECTION N/A 03/26/2021   Procedure: CESAREAN SECTION;  Surgeon: Vernal Gold, MD;  Location: [facility];  Service: Obstetrics;  Laterality: N/A;    Family History  Problem Relation Age of Onset   Asthma Sister    Cancer Maternal Grandmother    Allergic rhinitis Neg Hx    Angioedema Neg Hx    Atopy Neg Hx    Eczema Neg Hx    Immunodeficiency Neg Hx    Urticaria Neg Hx     Social History   Tobacco Use   Smoking status: Never   Smokeless tobacco: Never  Vaping Use   Vaping status: Never Used  Substance Use Topics   Alcohol use: No   Drug use: No    Allergies:  Allergies  Allergen Reactions   Flagyl  [Metronidazole ] Anaphylaxis   Tetracycline Anaphylaxis    Other reaction(s): anaphylaxis, Anaphylaxis   Tetracyclines & Related Anaphylaxis   Diflucan [Fluconazole] Swelling   Ibuprofen  Other (See Comments)    Damaged kidneys   Other Nausea And Vomiting    Antibiotic (patient cannot recall name/elementary school)     Medications Prior to Admission  Medication Sig Dispense Refill Last Dose/Taking   benzonatate  (TESSALON ) 100 MG capsule Take 1 capsule (100 mg total) by mouth every 8 (eight) hours. 21 capsule 0    ondansetron  (ZOFRAN ) 4 MG tablet Take 1 tablet (4 mg total) by mouth every 6 (six) hours. 12 tablet 0    phenol (CHLORASEPTIC) 1.4 % LIQD Use as directed 1 spray in the mouth or throat as needed for throat irritation / pain. 20 mL 0    Results for orders placed or performed during the hospital encounter of 10/23/23 (from the past 48 hours)  Pregnancy, urine POC     Status: Abnormal   Collection Time: 10/23/23  4:04 PM  Result Value Ref Range   Preg Test, Ur POSITIVE (A) NEGATIVE    Comment:        THE SENSITIVITY OF THIS METHODOLOGY IS >24 mIU/mL     Review of Systems  Gastrointestinal:  Negative for abdominal pain.  Genitourinary:  Negative  for vaginal bleeding and vaginal discharge.   Physical Exam   Blood pressure 111/72, pulse 80, temperature 98.2 F (36.8 C), temperature source Oral, resp. rate 16, unknown if currently breastfeeding.  Physical Exam Constitutional:      General: She is not in acute distress.    Appearance: She is well-developed. She is not ill-appearing, toxic-appearing or diaphoretic.  Abdominal:     Tenderness: There is no abdominal tenderness.   Neurological:     Mental Status: She is alert and oriented to person, place, and time.    MAU Course  Procedures  MDM  Patient has no pain at this time Abdominal pain benign.   Assessment and Plan   A:  1. Worried well   2. Positive pregnancy test     P:  Dc home Return to MAU if symptoms worsen Start prenatal care  April Crane, Juliette Oh, NP 10/23/2023 4:41 PM

## 2024-03-08 LAB — OB RESULTS CONSOLE HIV ANTIBODY (ROUTINE TESTING): HIV: NONREACTIVE

## 2024-03-08 LAB — OB RESULTS CONSOLE GC/CHLAMYDIA
Chlamydia: NEGATIVE
Neisseria Gonorrhea: NEGATIVE

## 2024-03-08 LAB — OB RESULTS CONSOLE HEPATITIS B SURFACE ANTIGEN: Hepatitis B Surface Ag: NEGATIVE

## 2024-03-08 LAB — OB RESULTS CONSOLE ANTIBODY SCREEN: Antibody Screen: NEGATIVE

## 2024-03-08 LAB — OB RESULTS CONSOLE RPR: RPR: NONREACTIVE

## 2024-03-08 LAB — HEPATITIS C ANTIBODY: HCV Ab: NEGATIVE

## 2024-03-08 LAB — OB RESULTS CONSOLE RUBELLA ANTIBODY, IGM: Rubella: NON-IMMUNE/NOT IMMUNE

## 2024-06-04 ENCOUNTER — Encounter (HOSPITAL_COMMUNITY): Payer: Self-pay | Admitting: *Deleted

## 2024-06-07 ENCOUNTER — Encounter (HOSPITAL_COMMUNITY): Payer: Self-pay

## 2024-06-08 ENCOUNTER — Telehealth (HOSPITAL_COMMUNITY): Payer: Self-pay | Admitting: *Deleted

## 2024-06-08 NOTE — Telephone Encounter (Signed)
 Preadmission screen

## 2024-06-11 ENCOUNTER — Encounter (HOSPITAL_COMMUNITY): Payer: Self-pay

## 2024-06-11 NOTE — Patient Instructions (Addendum)
"   April Crane  06/11/2024   Your procedure is scheduled on:  06/11/2024  Arrive at 0730 at Entrance C on [address] at [facility]  and [address]. You are invited to use the FREE valet parking or use the Visitor's parking deck.  Pick up the phone at the desk and dial [phone].  Call this number if you have problems the morning of surgery: [phone]  Remember:   Do not eat food:(After Midnight) Desps de medianoche.  You may drink clear liquids until 530l  _____.  Clear liquids means a liquid you can see thru.  It can have color such as Cola or Kool aid.  Tea is OK and coffee as long as no milk or creamer of any kind.  Take these medicines the morning of surgery with A SIP OF WATER :  valtrex   Do not wear jewelry, make-up or nail polish.  Do not wear lotions, powders, or perfumes. Do not wear deodorant.  Do not shave 48 hours prior to surgery.  Do not bring valuables to the hospital.  [facility] is not   responsible for any belongings or valuables brought to the hospital.  Contacts, dentures or bridgework may not be worn into surgery.  Leave suitcase in the car. After surgery it may be brought to your room.  For patients admitted to the hospital, checkout time is 11:00 AM the day of              discharge.      Please read over the following fact sheets that you were given:     Preparing for Surgery   "

## 2024-06-14 ENCOUNTER — Encounter (HOSPITAL_COMMUNITY): Admission: RE | Admit: 2024-06-14 | Source: Ambulatory Visit

## 2024-06-15 ENCOUNTER — Encounter (HOSPITAL_COMMUNITY): Admission: RE | Admit: 2024-06-15 | Source: Ambulatory Visit

## 2024-06-15 DIAGNOSIS — O34219 Maternal care for unspecified type scar from previous cesarean delivery: Secondary | ICD-10-CM

## 2024-06-15 LAB — TYPE AND SCREEN
ABO/RH(D): B POS
Antibody Screen: NEGATIVE

## 2024-06-15 LAB — CBC
HCT: 34.2 % — ABNORMAL LOW (ref 36.0–46.0)
Hemoglobin: 11.6 g/dL — ABNORMAL LOW (ref 12.0–15.0)
MCH: 29.7 pg (ref 26.0–34.0)
MCHC: 33.9 g/dL (ref 30.0–36.0)
MCV: 87.5 fL (ref 80.0–100.0)
Platelets: 244 10*3/uL (ref 150–400)
RBC: 3.91 MIL/uL (ref 3.87–5.11)
RDW: 15 % (ref 11.5–15.5)
WBC: 6.8 10*3/uL (ref 4.0–10.5)
nRBC: 0 % (ref 0.0–0.2)

## 2024-06-15 LAB — SYPHILIS: RPR W/REFLEX TO RPR TITER AND TREPONEMAL ANTIBODIES, TRADITIONAL SCREENING AND DIAGNOSIS ALGORITHM: RPR Ser Ql: NONREACTIVE

## 2024-06-16 ENCOUNTER — Inpatient Hospital Stay (HOSPITAL_COMMUNITY): Admission: RE | Admit: 2024-06-16 | Admitting: Student

## 2024-06-16 ENCOUNTER — Encounter (HOSPITAL_COMMUNITY): Admission: RE | Payer: Self-pay | Source: Home / Self Care

## 2024-06-16 DIAGNOSIS — O34219 Maternal care for unspecified type scar from previous cesarean delivery: Secondary | ICD-10-CM
